# Patient Record
Sex: Male | Born: 1940
Health system: Southern US, Community
[De-identification: ages and names within clinical notes are randomized; demographics above are authoritative.]

## PROBLEM LIST (undated history)

## (undated) DIAGNOSIS — I4819 Other persistent atrial fibrillation: Secondary | ICD-10-CM

## (undated) DIAGNOSIS — H547 Unspecified visual loss: Secondary | ICD-10-CM

## (undated) DIAGNOSIS — Z8739 Personal history of other diseases of the musculoskeletal system and connective tissue: Secondary | ICD-10-CM

## (undated) DIAGNOSIS — I10 Essential (primary) hypertension: Secondary | ICD-10-CM

## (undated) DIAGNOSIS — G4733 Obstructive sleep apnea (adult) (pediatric): Secondary | ICD-10-CM

## (undated) DIAGNOSIS — J069 Acute upper respiratory infection, unspecified: Secondary | ICD-10-CM

## (undated) DIAGNOSIS — E669 Obesity, unspecified: Secondary | ICD-10-CM

## (undated) DIAGNOSIS — J449 Chronic obstructive pulmonary disease, unspecified: Secondary | ICD-10-CM

## (undated) DIAGNOSIS — K219 Gastro-esophageal reflux disease without esophagitis: Secondary | ICD-10-CM

## (undated) DIAGNOSIS — I517 Cardiomegaly: Secondary | ICD-10-CM

## (undated) DIAGNOSIS — J42 Unspecified chronic bronchitis: Secondary | ICD-10-CM

## (undated) DIAGNOSIS — M1990 Unspecified osteoarthritis, unspecified site: Secondary | ICD-10-CM

## (undated) DIAGNOSIS — R413 Other amnesia: Secondary | ICD-10-CM

## (undated) DIAGNOSIS — E7800 Pure hypercholesterolemia, unspecified: Secondary | ICD-10-CM

## (undated) DIAGNOSIS — N200 Calculus of kidney: Secondary | ICD-10-CM

## (undated) DIAGNOSIS — E119 Type 2 diabetes mellitus without complications: Secondary | ICD-10-CM

## (undated) HISTORY — DX: Essential (primary) hypertension: I10

## (undated) HISTORY — PX: TONSILLECTOMY: SUR1361

## (undated) HISTORY — DX: Calculus of kidney: N20.0

## (undated) HISTORY — DX: Obesity, unspecified: E66.9

## (undated) HISTORY — DX: Cardiomegaly: I51.7

## (undated) HISTORY — DX: Type 2 diabetes mellitus without complications: E11.9

## (undated) HISTORY — PX: URETEROLITHOTOMY: SHX71

## (undated) HISTORY — DX: Other persistent atrial fibrillation: I48.19

---
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
## 2004-10-18 HISTORY — PX: INCISE AND DRAIN ABCESS: PRO64

## 2005-05-21 ENCOUNTER — Ambulatory Visit (HOSPITAL_COMMUNITY): Admission: RE | Admit: 2005-05-21 | Discharge: 2005-05-21 | Payer: Self-pay | Admitting: Unknown Physician Specialty

## 2005-06-14 ENCOUNTER — Ambulatory Visit: Payer: Self-pay | Admitting: Physical Medicine & Rehabilitation

## 2005-06-14 ENCOUNTER — Encounter
Admission: RE | Admit: 2005-06-14 | Discharge: 2005-09-12 | Payer: Self-pay | Admitting: Physical Medicine & Rehabilitation

## 2005-06-18 ENCOUNTER — Ambulatory Visit: Payer: Self-pay | Admitting: Cardiology

## 2005-08-11 ENCOUNTER — Ambulatory Visit: Payer: Self-pay | Admitting: Physical Medicine & Rehabilitation

## 2005-10-02 ENCOUNTER — Encounter
Admission: RE | Admit: 2005-10-02 | Discharge: 2005-12-31 | Payer: Self-pay | Admitting: Physical Medicine & Rehabilitation

## 2005-10-02 ENCOUNTER — Ambulatory Visit: Payer: Self-pay | Admitting: Physical Medicine & Rehabilitation

## 2005-12-02 ENCOUNTER — Ambulatory Visit: Payer: Self-pay | Admitting: Physical Medicine & Rehabilitation

## 2008-01-16 ENCOUNTER — Ambulatory Visit: Payer: Self-pay | Admitting: Cardiology

## 2008-03-08 ENCOUNTER — Ambulatory Visit: Payer: Self-pay | Admitting: Cardiology

## 2008-03-12 ENCOUNTER — Ambulatory Visit: Payer: Self-pay | Admitting: Cardiology

## 2008-03-12 ENCOUNTER — Encounter: Payer: Self-pay | Admitting: Physician Assistant

## 2008-03-15 ENCOUNTER — Ambulatory Visit: Payer: Self-pay | Admitting: Cardiology

## 2008-03-18 HISTORY — PX: CARDIAC CATHETERIZATION: SHX172

## 2008-04-05 ENCOUNTER — Ambulatory Visit: Payer: Self-pay | Admitting: Cardiology

## 2008-04-10 ENCOUNTER — Ambulatory Visit: Payer: Self-pay | Admitting: Cardiology

## 2008-04-11 ENCOUNTER — Ambulatory Visit (HOSPITAL_COMMUNITY): Admission: RE | Admit: 2008-04-11 | Discharge: 2008-04-11 | Payer: Self-pay | Admitting: Cardiovascular Disease

## 2008-04-11 ENCOUNTER — Ambulatory Visit: Payer: Self-pay | Admitting: Cardiovascular Disease

## 2008-04-17 ENCOUNTER — Ambulatory Visit: Payer: Self-pay | Admitting: Cardiology

## 2008-04-25 ENCOUNTER — Ambulatory Visit: Payer: Self-pay | Admitting: Cardiology

## 2008-04-30 ENCOUNTER — Ambulatory Visit: Payer: Self-pay | Admitting: Cardiology

## 2008-05-01 ENCOUNTER — Ambulatory Visit: Payer: Self-pay | Admitting: Cardiology

## 2008-05-15 ENCOUNTER — Ambulatory Visit: Payer: Self-pay | Admitting: Cardiology

## 2008-06-12 ENCOUNTER — Ambulatory Visit: Payer: Self-pay | Admitting: Cardiology

## 2008-07-08 ENCOUNTER — Ambulatory Visit: Payer: Self-pay | Admitting: Cardiology

## 2008-07-25 ENCOUNTER — Encounter: Payer: Self-pay | Admitting: Cardiology

## 2008-07-25 ENCOUNTER — Ambulatory Visit: Payer: Self-pay | Admitting: Cardiology

## 2008-08-06 ENCOUNTER — Ambulatory Visit: Payer: Self-pay | Admitting: Cardiology

## 2008-09-03 ENCOUNTER — Ambulatory Visit: Payer: Self-pay | Admitting: Cardiology

## 2008-10-01 ENCOUNTER — Ambulatory Visit: Payer: Self-pay | Admitting: Cardiology

## 2008-10-18 HISTORY — PX: LUMBAR DISC SURGERY: SHX700

## 2008-10-28 ENCOUNTER — Encounter
Admission: RE | Admit: 2008-10-28 | Discharge: 2009-01-26 | Payer: Self-pay | Admitting: Physical Medicine & Rehabilitation

## 2008-10-29 ENCOUNTER — Ambulatory Visit: Payer: Self-pay | Admitting: Cardiology

## 2008-10-29 ENCOUNTER — Ambulatory Visit: Payer: Self-pay | Admitting: Physical Medicine & Rehabilitation

## 2008-10-31 ENCOUNTER — Ambulatory Visit: Payer: Self-pay | Admitting: Physical Medicine & Rehabilitation

## 2008-11-27 ENCOUNTER — Ambulatory Visit: Payer: Self-pay | Admitting: Cardiology

## 2008-11-29 ENCOUNTER — Ambulatory Visit: Payer: Self-pay | Admitting: Physical Medicine & Rehabilitation

## 2008-12-04 ENCOUNTER — Ambulatory Visit (HOSPITAL_COMMUNITY)
Admission: RE | Admit: 2008-12-04 | Discharge: 2008-12-04 | Payer: Self-pay | Admitting: Physical Medicine & Rehabilitation

## 2008-12-12 ENCOUNTER — Ambulatory Visit: Payer: Self-pay | Admitting: Physical Medicine & Rehabilitation

## 2008-12-27 ENCOUNTER — Ambulatory Visit: Payer: Self-pay | Admitting: Cardiology

## 2009-01-16 ENCOUNTER — Ambulatory Visit: Payer: Self-pay | Admitting: Physical Medicine & Rehabilitation

## 2009-01-29 ENCOUNTER — Ambulatory Visit: Payer: Self-pay | Admitting: Cardiology

## 2009-02-04 ENCOUNTER — Encounter: Payer: Self-pay | Admitting: Cardiology

## 2009-02-04 ENCOUNTER — Ambulatory Visit: Payer: Self-pay | Admitting: Cardiology

## 2009-02-11 ENCOUNTER — Encounter
Admission: RE | Admit: 2009-02-11 | Discharge: 2009-02-14 | Payer: Self-pay | Admitting: Physical Medicine & Rehabilitation

## 2009-02-13 ENCOUNTER — Ambulatory Visit: Payer: Self-pay | Admitting: Physical Medicine & Rehabilitation

## 2009-02-14 ENCOUNTER — Ambulatory Visit: Payer: Self-pay | Admitting: Physical Medicine & Rehabilitation

## 2009-02-25 ENCOUNTER — Ambulatory Visit: Payer: Self-pay | Admitting: Cardiology

## 2009-03-11 ENCOUNTER — Ambulatory Visit: Payer: Self-pay | Admitting: Cardiology

## 2009-03-25 ENCOUNTER — Ambulatory Visit: Payer: Self-pay | Admitting: Cardiology

## 2009-04-11 ENCOUNTER — Ambulatory Visit: Payer: Self-pay

## 2009-05-09 ENCOUNTER — Ambulatory Visit: Payer: Self-pay

## 2009-05-21 ENCOUNTER — Ambulatory Visit: Payer: Self-pay | Admitting: Cardiology

## 2009-06-02 ENCOUNTER — Encounter: Payer: Self-pay | Admitting: *Deleted

## 2009-06-02 ENCOUNTER — Inpatient Hospital Stay (HOSPITAL_COMMUNITY): Admission: RE | Admit: 2009-06-02 | Discharge: 2009-06-03 | Payer: Self-pay | Admitting: Neurosurgery

## 2009-06-10 ENCOUNTER — Ambulatory Visit: Payer: Self-pay | Admitting: Cardiology

## 2009-07-01 ENCOUNTER — Ambulatory Visit: Payer: Self-pay | Admitting: Cardiology

## 2009-07-15 ENCOUNTER — Ambulatory Visit: Payer: Self-pay | Admitting: Cardiology

## 2009-07-15 LAB — CONVERTED CEMR LAB: POC INR: 2.9

## 2009-07-30 DIAGNOSIS — I4891 Unspecified atrial fibrillation: Secondary | ICD-10-CM

## 2009-08-08 ENCOUNTER — Ambulatory Visit: Payer: Self-pay | Admitting: Cardiology

## 2009-08-08 LAB — CONVERTED CEMR LAB: POC INR: 2

## 2009-08-18 ENCOUNTER — Ambulatory Visit: Payer: Self-pay | Admitting: Cardiology

## 2009-09-02 ENCOUNTER — Ambulatory Visit: Payer: Self-pay | Admitting: Cardiology

## 2009-09-02 LAB — CONVERTED CEMR LAB: POC INR: 1.7

## 2009-09-15 ENCOUNTER — Encounter: Admission: RE | Admit: 2009-09-15 | Discharge: 2009-09-15 | Payer: Self-pay | Admitting: Neurosurgery

## 2009-09-30 ENCOUNTER — Ambulatory Visit: Payer: Self-pay | Admitting: Cardiology

## 2009-10-18 HISTORY — PX: REPLACEMENT DISC ANTERIOR LUMBAR SPINE: SUR1215

## 2009-11-04 ENCOUNTER — Ambulatory Visit: Payer: Self-pay | Admitting: Cardiology

## 2009-11-04 LAB — CONVERTED CEMR LAB: POC INR: 2

## 2009-12-02 ENCOUNTER — Ambulatory Visit: Payer: Self-pay | Admitting: Cardiology

## 2009-12-02 LAB — CONVERTED CEMR LAB: POC INR: 2.6

## 2009-12-30 ENCOUNTER — Ambulatory Visit: Payer: Self-pay | Admitting: Cardiology

## 2010-01-30 ENCOUNTER — Ambulatory Visit: Payer: Self-pay | Admitting: Cardiology

## 2010-02-24 ENCOUNTER — Ambulatory Visit: Payer: Self-pay | Admitting: Cardiology

## 2010-03-03 ENCOUNTER — Encounter: Payer: Self-pay | Admitting: Cardiology

## 2010-03-13 ENCOUNTER — Encounter: Payer: Self-pay | Admitting: Cardiology

## 2010-03-17 ENCOUNTER — Ambulatory Visit: Payer: Self-pay | Admitting: Cardiology

## 2010-03-26 ENCOUNTER — Telehealth (INDEPENDENT_AMBULATORY_CARE_PROVIDER_SITE_OTHER): Payer: Self-pay | Admitting: *Deleted

## 2010-04-01 ENCOUNTER — Encounter: Payer: Self-pay | Admitting: Cardiology

## 2010-04-01 ENCOUNTER — Inpatient Hospital Stay (HOSPITAL_COMMUNITY): Admission: RE | Admit: 2010-04-01 | Discharge: 2010-04-06 | Payer: Self-pay | Admitting: Neurosurgery

## 2010-04-14 ENCOUNTER — Ambulatory Visit: Payer: Self-pay | Admitting: Cardiology

## 2010-05-05 ENCOUNTER — Ambulatory Visit: Payer: Self-pay | Admitting: Cardiology

## 2010-05-05 LAB — CONVERTED CEMR LAB: POC INR: 2.5

## 2010-06-02 ENCOUNTER — Ambulatory Visit: Payer: Self-pay | Admitting: Cardiology

## 2010-06-02 LAB — CONVERTED CEMR LAB: POC INR: 3.3

## 2010-06-30 ENCOUNTER — Ambulatory Visit: Payer: Self-pay | Admitting: Cardiology

## 2010-06-30 LAB — CONVERTED CEMR LAB: POC INR: 2.4

## 2010-07-28 ENCOUNTER — Ambulatory Visit: Payer: Self-pay | Admitting: Cardiology

## 2010-08-20 ENCOUNTER — Ambulatory Visit: Payer: Self-pay | Admitting: Cardiology

## 2010-08-20 DIAGNOSIS — E782 Mixed hyperlipidemia: Secondary | ICD-10-CM

## 2010-08-21 ENCOUNTER — Encounter: Payer: Self-pay | Admitting: Cardiology

## 2010-09-25 ENCOUNTER — Ambulatory Visit: Payer: Self-pay | Admitting: Cardiology

## 2010-09-28 ENCOUNTER — Telehealth (INDEPENDENT_AMBULATORY_CARE_PROVIDER_SITE_OTHER): Payer: Self-pay | Admitting: *Deleted

## 2010-11-08 ENCOUNTER — Encounter: Payer: Self-pay | Admitting: Internal Medicine

## 2010-11-15 LAB — CONVERTED CEMR LAB
POC INR: 2.3
POC INR: 2.3

## 2010-11-19 NOTE — Medication Information (Signed)
Summary: ccr-lr  Anticoagulant Therapy  Managed by: Vashti Hey, RN PCP: Dr. Erasmo Downer Supervising MD: Andee Lineman MD, Michelle Piper Indication 1: Atrial Fibrillation (ICD-427.31) Lab [facility] [place] Site: [facility] Clinic INR POC 1.7  Dietary changes: no    Health status changes: no    Bleeding/hemorrhagic complications: no    Recent/future hospitalizations: no    Any changes in medication regimen? no    Recent/future dental: no  Any missed doses?: no       Is patient compliant with meds? yes      Comments: pt denies missing doses  Allergies: 1)  ! Pcn  Anticoagulation Management History:      The patient is taking warfarin and comes in today for a routine follow up visit.  Positive risk factors for bleeding include an age of 69 years or older.  The bleeding index is 'intermediate risk'.  Negative CHADS2 values include Age > 88 years old.  The start date was 10/19/2007.  Anticoagulation responsible provider: Andee Lineman MD, Michelle Piper.  INR POC: 1.7.  Cuvette Lot#: [id number].  Exp: 10/11.    Anticoagulation Management Assessment/Plan:      The patient's current anticoagulation dose is Warfarin sodium 2.5 mg tabs: Take 1 tablet by mouth as directed, Warfarin sodium 5 mg tabs: Take 1 tablet by mouth as directed.  The target INR is 2 - 3.  The next INR is due 02/24/2010.  Anticoagulation instructions were given to patient.  Results were reviewed/authorized by Vashti Hey, RN.  He was notified by Vashti Hey RN.         Prior Anticoagulation Instructions: INR 2.2 Continue coumadin 7.5mg  once daily except 10mg  on T,Th,Sat  Current Anticoagulation Instructions: INR 1.7 Take coumadin 15mg  tonight, 12.5mg  tomorrow night then resume 7.5mg  qd except 10mg  on Tuesdays, Thursdays and Saturdays

## 2010-11-19 NOTE — Letter (Signed)
Summary: Appointment -missed  [facility] at [facility] [address]   [place], [place] [address]   Phone: [phone]  Fax: [phone]     Mar 13, 2010 MRN: [id number]     [name] [name] [address] [place], [place]  [address]     Dear Mr. Christians,  Our records indicate you missed your appointment on Mar 13, 2010                        with COUMADIN.   It is very important that we reach you to reschedule this appointment. We look forward to participating in your health care needs.   Please contact us at the number listed above at your earliest convenience to reschedule this appointment.   Sincerely,    Glass blower/designer

## 2010-11-19 NOTE — Medication Information (Signed)
Summary: ccr-lr  Anticoagulant Therapy  Managed by: Vashti Hey, RN PCP: Dr. Erasmo Downer Supervising MD: Andee Lineman MD, Michelle Piper Indication 1: Atrial Fibrillation (ICD-427.31) Lab [facility] [place] Site: [facility] Care Clinic INR POC 2.0  Dietary changes: no    Health status changes: no    Bleeding/hemorrhagic complications: no    Recent/future hospitalizations: no    Any changes in medication regimen? no    Recent/future dental: no  Any missed doses?: no       Is patient compliant with meds? yes       Allergies: 1)  ! Pcn  Anticoagulation Management History:      The patient is taking warfarin and comes in today for a routine follow up visit.  Positive risk factors for bleeding include an age of 69 years or older.  The bleeding index is 'intermediate risk'.  Negative CHADS2 values include Age > 69 years old.  The start date was 10/19/2007.  Anticoagulation responsible provider: Andee Lineman MD, Michelle Piper.  INR POC: 2.0.  Cuvette Lot#: [id number].  Exp: 10/11.    Anticoagulation Management Assessment/Plan:      The patient's current anticoagulation dose is Warfarin sodium 2.5 mg tabs: Take 1 tablet by mouth as directed, Warfarin sodium 5 mg tabs: Take 1 tablet by mouth as directed.  The target INR is 2 - 3.  The next INR is due 12/02/2009.  Anticoagulation instructions were given to patient.  Results were reviewed/authorized by Vashti Hey, RN.  He was notified by Vashti Hey RN.         Prior Anticoagulation Instructions: INR 2.2 Continue coumadin 7.5mg  once daily except 10mg  on Tuesdays and Saturdays  Current Anticoagulation Instructions: INR 2.0 Take coumadin 12.5mg  tonight then increase dose to 7.5mg  once daily exxept 10mg  on T,Th,Sat

## 2010-11-19 NOTE — Medication Information (Signed)
Summary: ccr  Anticoagulant Therapy  Managed by: Vashti Hey, RN PCP: Dr. Erasmo Downer Supervising MD: Antoine Poche MD, Fayrene Fearing Indication 1: Atrial Fibrillation (ICD-427.31) Lab Used: [facility] [place] Site: [facility] Care Clinic INR POC 3.0  Dietary changes: no    Health status changes: yes       Details: scheduled for back surgery on 6/15  Pt was told to stop coumadin on 6/9  Bleeding/hemorrhagic complications: no    Recent/future hospitalizations: no    Any changes in medication regimen? no    Recent/future dental: no  Any missed doses?: no       Is patient compliant with meds? yes       Allergies: 1)  ! Pcn  Anticoagulation Management History:      Positive risk factors for bleeding include an age of 69 years or older.  The bleeding index is 'intermediate risk'.  Negative CHADS2 values include Age > 88 years old.  The start date was 10/19/2007.  Anticoagulation responsible provider: Antoine Poche MD, Fayrene Fearing.  INR POC: 3.0.  Exp: 10/11.    Anticoagulation Management Assessment/Plan:      The patient's current anticoagulation dose is Warfarin sodium 2.5 mg tabs: Take 1 tablet by mouth as directed, Warfarin sodium 5 mg tabs: Take 1 tablet by mouth as directed.  The target INR is 2 - 3.  The next INR is due 04/14/2010.  Anticoagulation instructions were given to patient.  Results were reviewed/authorized by Vashti Hey, RN.  He was notified by Vashti Hey RN.         Prior Anticoagulation Instructions: INR 1.8 increase coumadin to 10mg  once daily   Current Anticoagulation Instructions: INR 3.0 Continue coumadin 10mg  once daily until surgery.  Dr Terrilee Files told pt to stop coumadin on 03/26/10. Surgery schedueld for 6/15  Will be in hosp. appx 4 days. Will resume coumadin at above dose after returning home unless advised differently upon discharge.

## 2010-11-19 NOTE — Op Note (Signed)
Summary: Operative Report  Operative Report   Imported By: Dorise Hiss 08/20/2010 08:17:15  _____________________________________________________________________  External Attachment:    Type:   Image     Comment:   External Document

## 2010-11-19 NOTE — Medication Information (Signed)
Summary: CCR  Anticoagulant Therapy  Managed by: Vashti Hey, RN PCP: Dr. Erasmo Downer Supervising MD: Andee Lineman MD, Michelle Piper Indication 1: Atrial Fibrillation (ICD-427.31) Lab [facility]  Site: [facility] Clinic INR POC 1.8  Dietary changes: no    Health status changes: no    Bleeding/hemorrhagic complications: no    Recent/future hospitalizations: no    Any changes in medication regimen? no    Recent/future dental: no  Any missed doses?: no       Is patient compliant with meds? yes       Allergies: 1)  ! Pcn  Anticoagulation Management History:      The patient is taking warfarin and comes in today for a routine follow up visit.  Positive risk factors for bleeding include an age of 69 years or older.  The bleeding index is 'intermediate risk'.  Negative CHADS2 values include Age > 69 years old.  The start date was 10/19/2007.  Anticoagulation responsible provider: Andee Lineman MD, Michelle Piper.  INR POC: 1.8.  Cuvette Lot#: [id number].  Exp: 10/11.    Anticoagulation Management Assessment/Plan:      The patient's current anticoagulation dose is Warfarin sodium 2.5 mg tabs: Take 1 tablet by mouth as directed, Warfarin sodium 5 mg tabs: Take 1 tablet by mouth as directed.  The target INR is 2 - 3.  The next INR is due 03/13/2010.  Anticoagulation instructions were given to patient.  Results were reviewed/authorized by Vashti Hey, RN.  He was notified by Vashti Hey RN.         Prior Anticoagulation Instructions: INR 1.7 Take coumadin 15mg  tonight, 12.5mg  tomorrow night then resume 7.5mg  qd except 10mg  on Tuesdays, Thursdays and Saturdays  Current Anticoagulation Instructions: INR 1.8 increase coumadin to 10mg  once daily

## 2010-11-19 NOTE — Medication Information (Signed)
Summary: ccr-lr  Anticoagulant Therapy  Managed by: Vashti Hey, RN PCP: Dr. Erasmo Downer Supervising MD: Diona Browner MD, Remi Deter Indication 1: Atrial Fibrillation (ICD-427.31) Lab Used: [facility] [place] Site: [facility] Clinic INR POC 2.2  Dietary changes: no    Health status changes: no    Bleeding/hemorrhagic complications: no    Recent/future hospitalizations: yes       Details: Had lower back surgery on 04/01/10 @ [facility]     D/C 04/04/10  Any changes in medication regimen? no    Recent/future dental: no  Any missed doses?: yes     Details: Was off coumadin 5 days prior to back surgery  Is patient compliant with meds? yes       Allergies: 1)  ! Pcn  Anticoagulation Management History:      The patient is taking warfarin and comes in today for a routine follow up visit.  Positive risk factors for bleeding include an age of 69 years or older.  The bleeding index is 'intermediate risk'.  Negative CHADS2 values include Age > 2 years old.  The start date was 10/19/2007.  Anticoagulation responsible provider: Diona Browner MD, Remi Deter.  INR POC: 2.2.  Cuvette Lot#: [id number].  Exp: 10/11.    Anticoagulation Management Assessment/Plan:      The patient's current anticoagulation dose is Warfarin sodium 2.5 mg tabs: Take 1 tablet by mouth as directed, Warfarin sodium 5 mg tabs: Take 1 tablet by mouth as directed.  The target INR is 2 - 3.  The next INR is due 05/05/2010.  Anticoagulation instructions were given to patient.  Results were reviewed/authorized by Vashti Hey, RN.  He was notified by Vashti Hey RN.         Prior Anticoagulation Instructions: INR 3.0 Continue coumadin 10mg  once daily until surgery.  Dr Terrilee Files told pt to stop coumadin on 03/26/10. Surgery schedueld for 6/15  Will be in hosp. appx 4 days. Will resume coumadin at above dose after returning home unless advised differently upon discharge.  Current Anticoagulation Instructions: INR 2.2 Continue  coumadin 10mg  once daily

## 2010-11-19 NOTE — Medication Information (Signed)
Summary: ccr-pt coming at 1:30pm-lr  Anticoagulant Therapy  Managed by: Vashti Hey, RN PCP: Dr. Erasmo Downer Supervising MD: Diona Browner MD, Remi Deter Indication 1: Atrial Fibrillation (ICD-427.31) Lab Used: [facility] [place] Site: [facility] Clinic INR POC 2.4  Dietary changes: no    Health status changes: no    Bleeding/hemorrhagic complications: no    Recent/future hospitalizations: no    Any changes in medication regimen? no    Recent/future dental: no  Any missed doses?: no       Is patient compliant with meds? yes       Allergies: 1)  ! Pcn  Anticoagulation Management History:      The patient is taking warfarin and comes in today for a routine follow up visit.  Positive risk factors for bleeding include an age of 26 years or older.  The bleeding index is 'intermediate risk'.  Negative CHADS2 values include Age > 3 years old.  The start date was 10/19/2007.  Anticoagulation responsible Torian Thoennes: Diona Browner MD, Remi Deter.  INR POC: 2.4.  Cuvette Lot#: [id number].  Exp: 10/11.    Anticoagulation Management Assessment/Plan:      The patient's current anticoagulation dose is Warfarin sodium 2.5 mg tabs: Take 1 tablet by mouth as directed, Warfarin sodium 5 mg tabs: Take 2 tablet by mouth daily as directed.  The target INR is 2 - 3.  The next INR is due 07/28/2010.  Anticoagulation instructions were given to patient.  Results were reviewed/authorized by Vashti Hey, RN.  He was notified by Vashti Hey RN.         Prior Anticoagulation Instructions: INR 3.3 Take coumadin 5mg  tonight then resume 10mg  once daily   Current Anticoagulation Instructions: INR 2.4 Continue coumadin 10mg  once daily

## 2010-11-19 NOTE — Progress Notes (Signed)
Summary: [facility] wants Warfarin 5mg  RX  Phone Note From Pharmacy Call back at [phone] option 2   Summary of Call: [facility] pharmacy called requesting verbal ok for Warfarin 5mg  tablets. Prescription for 2.5mg  tablets was sent by Isabelle Course on 5/31. WIll have Misty Stanley confirm that this is acceptable. Initial call taken by: Cyril Loosen, RN, BSN,  March 26, 2010 11:42 AM  Follow-up for Phone Call        Coumadin 5mg  #90 called to [facility]. Follow-up by: Vashti Hey RN,  March 26, 2010 3:42 PM

## 2010-11-19 NOTE — Assessment & Plan Note (Signed)
Summary: 1 YR FU PER NOVEMBER  REMINDER/needs INR check -lr   Visit Type:  Follow-up Primary Provider:  Dr. Erasmo Downer   History of Present Illness: 70 year old male presents for followup. He was seen in November 2010 for followup. In the interim he underwent a redo lumbar laminectomy in June of this year with Dr. Lovell Sheehan.  He has had no anginal chest pain or breakthrough palpitations. Medicines are stable. He has done well with Coumadin, and his insurance covers his blood checks. We did talk about Pradaxa at his request, however do not plan to make any changes at this time. Lipids are followed with Dr. Linna Darner. Mr. Ciullo plans to start seeing Dr. Sherryll Burger when Dr. Linna Darner closes his office practice.  Clinical Review Panels:  Echocardiogram Echocardiogram  Conclusions:         1. The study quality is fair.          2. The study was technically limited due to the patient's inability to         lay in the left lateral decubitus position.          3. Mild concentric left ventricular hypertrophy is observed.         4. The estimated ejection fraction is 60-65%.          5. There is upper septal thickening.         6. The left atrium is moderately dilated.         7. The right ventricle is mildly dilated.          8. The right ventricular global systolic function is mildly reduced.         9. The right atrium is mildly dilated.          10. The aortic valve structure is normal.         11. The mitral valve leaflets appear normal.         12. There is mild mitral regurgitation.  (02/04/2009)    Preventive Screening-Counseling & Management  Alcohol-Tobacco     Smoking Status: never  Current Medications (verified): 1)  Flecainide Acetate 150 Mg Tabs (Flecainide Acetate) .... Take 1 Tablet By Mouth Twice A Day 2)  Lisinopril 10 Mg Tabs (Lisinopril) .... Take 1 Tablet By Mouth Once A Day 3)  Warfarin Sodium 2.5 Mg Tabs (Warfarin Sodium) .... Take 1 Tablet By Mouth As Directed 4)  Warfarin  Sodium 5 Mg Tabs (Warfarin Sodium) .... Take 2 Tablet By Mouth Daily As Directed 5)  Simvastatin 40 Mg Tabs (Simvastatin) .... Take 1 Tablet By Mouth Once A Day 6)  Fish Oil 1000 Mg Caps (Omega-3 Fatty Acids) .... Take 2 Tablet By Mouth Two Times A Day 7)  Prevacid 15 Mg Cpdr (Lansoprazole) .... Take 1 Tablet By Mouth Two Times A Day 8)  Alprazolam 0.25 Mg Tabs (Alprazolam) .... Take 1 Tablet By Mouth Once A Day As Needed 9)  Doxepin Hcl 25 Mg Caps (Doxepin Hcl) .... Take 1 Tablet By Mouth Two Times A Day As Needed 10)  Diphenhydramine Hcl 25 Mg Caps (Diphenhydramine Hcl) .... Take 1 Tablet By Mouth Once A Day At Bedtime 11)  Oxycodone-Acetaminophen 10-325 Mg Tabs (Oxycodone-Acetaminophen) .... Take One By Mouth Every 6 Hours As Needed Pain 12)  Ventolin Hfa 108 (90 Base) Mcg/act Aers (Albuterol Sulfate) .... As Needed 13)  Metformin Hcl 500 Mg Tabs (Metformin Hcl) .Marland Kitchen.. 1 Tablet Two Times A Day 14)  Glipizide Xl 5 Mg Xr24h-Tab (  Glipizide) .... Take 1 Tablet By Mouth Once A Day 15)  Colace 100 Mg Caps (Docusate Sodium) .... As Needed  Allergies (verified): 1)  ! Pcn  Comments:  Nurse/Medical Assistant: The patient's medication list and allergies were reviewed with the patient and were updated in the Medication and Allergy Lists.  Past History:  Past Medical History: Last updated: 08/18/2009 Arthritis Atrial Fibrillation Diabetes Type 2 G E R D Hypertension Nephrolithiasis Benign prostatic hypertrophy  Social History: Last updated: 07/30/2009 Full Time Married  Tobacco Use - Former.  Alcohol Use - no Regular Exercise - no Drug Use - no  Review of Systems  The patient denies anorexia, fever, weight loss, chest pain, syncope, dyspnea on exertion, peripheral edema, melena, and hematochezia.         Still has residual back pain and some leg pain on the right. Has follow up with Dr. Lovell Sheehan in a few weeks. Otherwise reviewed and negative.  Vital Signs:  Patient profile:    70 year old male Height:      77 inches Weight:      277 pounds BMI:     32.97 Pulse rate:   68 / minute BP sitting:   125 / 80  (left arm) Cuff size:   large  Vitals Entered By: Carlye Grippe (August 20, 2010 9:20 AM)  Nutrition Counseling: Patient's BMI is greater than 25 and therefore counseled on weight management options.  Physical Exam  Additional Exam:  Obese male in no acute distress. HEENT: Conjunctiva and lids normal, all frank clear. Neck: Supple, no carotid bruits, no thyromegaly. Lungs: Clear to auscultation, nonlabored. Cardiac: Indistinct PMI, regular rate and rhythm, no S3 gallop. Extremities: No pitting edema. Distal pulses 2+.   EKG  Procedure date:  08/20/2010  Findings:      Sinus rhythm at 68 beats per minute with leftward axis, QRS 126 ms. QTC 446 ms.  Impression & Recommendations:  Problem # 1:  ATRIAL FIBRILLATION (ICD-427.31)  Symptomatically stable, in normal sinus rhythm today. Plan to continue flecainide and Coumadin. Annual followup will be maintained, sooner if needed.  His updated medication list for this problem includes:    Flecainide Acetate 150 Mg Tabs (Flecainide acetate) .Marland Kitchen... Take 1 tablet by mouth twice a day    Warfarin Sodium 2.5 Mg Tabs (Warfarin sodium) .Marland Kitchen... Take 1 tablet by mouth as directed    Warfarin Sodium 5 Mg Tabs (Warfarin sodium) .Marland Kitchen... Take 2 tablet by mouth daily as directed  Orders: EKG w/ Interpretation (93000) Protime INR (04540)  Problem # 2:  MIXED HYPERLIPIDEMIA (ICD-272.2)  Followed by Dr. Linna Darner.  The following medications were removed from the medication list:    Trilipix 135 Mg Cpdr (Choline fenofibrate) .Marland Kitchen... Take 1 tablet by mouth once a day His updated medication list for this problem includes:    Simvastatin 40 Mg Tabs (Simvastatin) .Marland Kitchen... Take 1 tablet by mouth once a day  Patient Instructions: 1)  Your physician wants you to follow-up in: 1 year. You will receive a reminder letter in the mail  one-two months in advance. If you don't receive a letter, please call our office to schedule the follow-up appointment. 2)  Your physician recommends that you continue on your current medications as directed. Please refer to the Current Medication list given to you today.  Anticoagulant Therapy  Managed by: Vashti Hey, RN PCP: Dr. Erasmo Downer Supervising MD: Diona Browner MD, Remi Deter Indication 1: Atrial Fibrillation (ICD-427.31) Lab Used: [facility]  [place] Site: [facility] of Care Clinic INR POC 2.3  Vital Signs: Weight: 277 lbs.  Pulse Rate: 68  Blood Pressure:  125 / 80   Dietary changes: no    Health status changes: no    Bleeding/hemorrhagic complications: no    Recent/future hospitalizations: no    Any changes in medication regimen? no    Recent/future dental: no  Any missed doses?: no       Is patient compliant with meds? yes

## 2010-11-19 NOTE — Medication Information (Signed)
Summary: ccr-lr  Anticoagulant Therapy  Managed by: Vashti Hey, RN PCP: Dr. Erasmo Downer Supervising MD: Andee Lineman MD, Michelle Piper Indication 1: Atrial Fibrillation (ICD-427.31) Lab [facility]: [facility] [place] Site: [facility] Care Clinic INR POC 2.5  Dietary changes: no    Health status changes: no    Bleeding/hemorrhagic complications: no    Recent/future hospitalizations: no    Any changes in medication regimen? no    Recent/future dental: no  Any missed doses?: no       Is patient compliant with meds? yes       Allergies: 1)  ! Pcn  Anticoagulation Management History:      The patient is taking warfarin and comes in today for a routine follow up visit.  Positive risk factors for bleeding include an age of 69 years or older.  The bleeding index is 'intermediate risk'.  Negative CHADS2 values include Age > 13 years old.  The start date was 10/19/2007.  Anticoagulation responsible provider: Andee Lineman MD, Michelle Piper.  INR POC: 2.5.  Cuvette Lot#: [id number].  Exp: 10/11.    Anticoagulation Management Assessment/Plan:      The patient's current anticoagulation dose is Warfarin sodium 2.5 mg tabs: Take 1 tablet by mouth as directed, Warfarin sodium 5 mg tabs: Take 1 tablet by mouth as directed.  The target INR is 2 - 3.  The next INR is due 06/02/2010.  Anticoagulation instructions were given to patient.  Results were reviewed/authorized by Vashti Hey, RN.  He was notified by Vashti Hey RN.         Prior Anticoagulation Instructions: INR 2.2 Continue coumadin 10mg  once daily   Current Anticoagulation Instructions: INR 2.5 Continue coumadin 10mg  once daily

## 2010-11-19 NOTE — Assessment & Plan Note (Signed)
Summary: coumadin check   Anticoagulant Therapy  Managed by: Eustace Pen RN PCP: Dr. Erasmo Downer Supervising MD: Diona Browner MD, Remi Deter Indication 1: Atrial Fibrillation (ICD-427.31) Lab [facility] [place] Site: [facility] Care Clinic INR POC 2.3  Dietary changes: no    Health status changes: no    Bleeding/hemorrhagic complications: no    Recent/future hospitalizations: no    Any changes in medication regimen? no    Recent/future dental: no  Any missed doses?: no       Is patient compliant with meds? yes         Anticoagulation Management History:      The patient is taking warfarin and comes in today for a routine follow up visit.  Positive risk factors for bleeding include an age of 67 years or older.  The bleeding index is 'intermediate risk'.  Negative CHADS2 values include Age > 19 years old.  The start date was 10/19/2007.  Anticoagulation responsible provider: Diona Browner MD, Remi Deter.  INR POC: 2.3.  Cuvette Lot#: [id number].  Exp: 10/11.    Anticoagulation Management Assessment/Plan:      The patient's current anticoagulation dose is Warfarin sodium 2.5 mg tabs: Take 1 tablet by mouth as directed, Warfarin sodium 5 mg tabs: Take 2 tablet by mouth daily as directed.  The target INR is 2 - 3.  The next INR is due 09/18/2010.  Anticoagulation instructions were given to patient.  Results were reviewed/authorized by Eustace Pen RN.  He was notified by Eustace Pen RN.         Prior Anticoagulation Instructions: INR 2.7  Continue coumadin 10mg  once daily   Current Anticoagulation Instructions: INR 2.3 Continue coumadin 10mg  once daily

## 2010-11-19 NOTE — Medication Information (Signed)
Summary: ccr-lr  Anticoagulant Therapy  Managed by: Vashti Hey, RN PCP: Dr. Erasmo Downer Supervising MD: Diona Browner MD, Remi Deter Indication 1: Atrial Fibrillation (ICD-427.31) Lab Used: [facility] [place] Site: [facility] INR POC 2.7  Dietary changes: no    Health status changes: no    Bleeding/hemorrhagic complications: no    Recent/future hospitalizations: no    Any changes in medication regimen? no    Recent/future dental: no  Any missed doses?: no       Is patient compliant with meds? yes       Allergies: 1)  ! Pcn  Anticoagulation Management History:      The patient is taking warfarin and comes in today for a routine follow up visit.  Positive risk factors for bleeding include an age of 69 years or older.  The bleeding index is 'intermediate risk'.  Negative CHADS2 values include Age > 69 years old.  The start date was 10/19/2007.  Anticoagulation responsible provider: Diona Browner MD, Remi Deter.  INR POC: 2.7.  Cuvette Lot#: [id number].  Exp: 10/11.    Anticoagulation Management Assessment/Plan:      The patient's current anticoagulation dose is Warfarin sodium 2.5 mg tabs: Take 1 tablet by mouth as directed, Warfarin sodium 5 mg tabs: Take 2 tablet by mouth daily as directed.  The target INR is 2 - 3.  The next INR is due 08/20/2010.  Anticoagulation instructions were given to patient.  Results were reviewed/authorized by Vashti Hey, RN.  He was notified by Vashti Hey RN.         Prior Anticoagulation Instructions: INR 2.4 Continue coumadin 10mg  once daily   Current Anticoagulation Instructions: INR 2.7  Continue coumadin 10mg  once daily

## 2010-11-19 NOTE — Letter (Signed)
Summary: Letter/ FAXED [facility]  Letter/ FAXED [facility]   Imported By: Dorise Hiss 03/09/2010 14:41:00  _____________________________________________________________________  External Attachment:    Type:   Image     Comment:   External Document

## 2010-11-19 NOTE — Medication Information (Signed)
Summary: ccr-lr  Anticoagulant Therapy  Managed by: Vashti Hey, RN PCP: Dr. Erasmo Downer Supervising MD: Antoine Poche MD, Fayrene Fearing Indication 1: Atrial Fibrillation (ICD-427.31) Lab Used: [facility] [place] Site: [facility] INR POC 2.6  Dietary changes: no    Health status changes: no    Bleeding/hemorrhagic complications: no    Recent/future hospitalizations: no    Any changes in medication regimen? yes       Details: started on Lyrica CV 50mg  bid  Recent/future dental: no  Any missed doses?: no       Is patient compliant with meds? yes       Allergies: 1)  ! Pcn  Anticoagulation Management History:      The patient is taking warfarin and comes in today for a routine follow up visit.  Positive risk factors for bleeding include an age of 69 years or older.  The bleeding index is 'intermediate risk'.  Negative CHADS2 values include Age > 84 years old.  The start date was 10/19/2007.  Anticoagulation responsible provider: Antoine Poche MD, Fayrene Fearing.  INR POC: 2.6.  Cuvette Lot#: [id number].  Exp: 10/11.    Anticoagulation Management Assessment/Plan:      The patient's current anticoagulation dose is Warfarin sodium 2.5 mg tabs: Take 1 tablet by mouth as directed, Warfarin sodium 5 mg tabs: Take 1 tablet by mouth as directed.  The target INR is 2 - 3.  The next INR is due 12/30/2009.  Anticoagulation instructions were given to patient.  Results were reviewed/authorized by Vashti Hey, RN.  He was notified by Vashti Hey RN.         Prior Anticoagulation Instructions: INR 2.0 Take coumadin 12.5mg  tonight then increase dose to 7.5mg  once daily exxept 10mg  on T,Th,Sat  Current Anticoagulation Instructions: INR 2.6 Continue coumadin 7.5mg  once daily except 10mg  T,Th,Sat

## 2010-11-19 NOTE — Medication Information (Signed)
Summary: ccr-lr  Anticoagulant Therapy  Managed by: Vashti Hey, RN PCP: Dr. Erasmo Downer Supervising MD: Antoine Poche MD, Fayrene Fearing Indication 1: Atrial Fibrillation (ICD-427.31) Lab Used: [facility] [place] Site: [facility] INR POC 3.3  Dietary changes: no    Health status changes: no    Bleeding/hemorrhagic complications: no    Recent/future hospitalizations: no    Any changes in medication regimen? no    Recent/future dental: no  Any missed doses?: no       Is patient compliant with meds? yes       Allergies: 1)  ! Pcn  Anticoagulation Management History:      The patient is taking warfarin and comes in today for a routine follow up visit.  Positive risk factors for bleeding include an age of 31 years or older.  The bleeding index is 'intermediate risk'.  Negative CHADS2 values include Age > 80 years old.  The start date was 10/19/2007.  Anticoagulation responsible provider: Antoine Poche MD, Fayrene Fearing.  INR POC: 3.3.  Cuvette Lot#: [id number].  Exp: 10/11.    Anticoagulation Management Assessment/Plan:      The patient's current anticoagulation dose is Warfarin sodium 2.5 mg tabs: Take 1 tablet by mouth as directed, Warfarin sodium 5 mg tabs: Take 2 tablet by mouth daily as directed.  The target INR is 2 - 3.  The next INR is due 06/30/2010.  Anticoagulation instructions were given to patient.  Results were reviewed/authorized by Vashti Hey, RN.  He was notified by Vashti Hey RN.         Prior Anticoagulation Instructions: INR 2.5 Continue coumadin 10mg  once daily   Current Anticoagulation Instructions: INR 3.3 Take coumadin 5mg  tonight then resume 10mg  once daily

## 2010-11-19 NOTE — Progress Notes (Signed)
Summary: Prescription plan change  Phone Note Call from Patient Call back at Home Phone [phone]   Summary of Call: Pt left message on voicemail asking for a return call. He states he has changed from [facility] to [facility] and wanted to make sure we had this information in our system. Left message to notify pt we will make this change when refills or med changes are made. Notified pt to make sure any refill requests come from [facility] not [facility].  Initial call taken by: Cyril Loosen, RN, BSN,  September 28, 2010 2:57 PM

## 2010-11-19 NOTE — Medication Information (Signed)
Summary: ccr-lr  Anticoagulant Therapy  Managed by: Vashti Hey, RN PCP: Dr. Erasmo Downer Supervising MD: Andee Lineman MD, Michelle Piper Indication 1: Atrial Fibrillation (ICD-427.31) Lab Used: [facility] [place] Site: [facility] INR POC 2.2  Dietary changes: no    Health status changes: no    Bleeding/hemorrhagic complications: no    Recent/future hospitalizations: no    Any changes in medication regimen? no    Recent/future dental: no  Any missed doses?: no       Is patient compliant with meds? yes       Allergies: 1)  ! Pcn  Anticoagulation Management History:      The patient is taking warfarin and comes in today for a routine follow up visit.  Positive risk factors for bleeding include an age of 73 years or older.  The bleeding index is 'intermediate risk'.  Negative CHADS2 values include Age > 88 years old.  The start date was 10/19/2007.  Anticoagulation responsible provider: Andee Lineman MD, Michelle Piper.  INR POC: 2.2.  Cuvette Lot#: [id number].  Exp: 10/11.    Anticoagulation Management Assessment/Plan:      The patient's current anticoagulation dose is Warfarin sodium 2.5 mg tabs: Take 1 tablet by mouth as directed, Warfarin sodium 5 mg tabs: Take 1 tablet by mouth as directed.  The target INR is 2 - 3.  The next INR is due 01/30/2010.  Anticoagulation instructions were given to patient.  Results were reviewed/authorized by Vashti Hey, RN.  He was notified by Vashti Hey RN.         Prior Anticoagulation Instructions: INR 2.6 Continue coumadin 7.5mg  once daily except 10mg  T,Th,Sat  Current Anticoagulation Instructions: INR 2.2 Continue coumadin 7.5mg  once daily except 10mg  on T,Th,Sat

## 2011-01-03 LAB — POCT I-STAT GLUCOSE
Glucose, Bld: 110 mg/dL — ABNORMAL HIGH (ref 70–99)
Glucose, Bld: 126 mg/dL — ABNORMAL HIGH (ref 70–99)
Operator id: 177011

## 2011-01-03 LAB — GLUCOSE, CAPILLARY
Glucose-Capillary: 104 mg/dL — ABNORMAL HIGH (ref 70–99)
Glucose-Capillary: 108 mg/dL — ABNORMAL HIGH (ref 70–99)
Glucose-Capillary: 118 mg/dL — ABNORMAL HIGH (ref 70–99)
Glucose-Capillary: 129 mg/dL — ABNORMAL HIGH (ref 70–99)
Glucose-Capillary: 129 mg/dL — ABNORMAL HIGH (ref 70–99)
Glucose-Capillary: 129 mg/dL — ABNORMAL HIGH (ref 70–99)
Glucose-Capillary: 144 mg/dL — ABNORMAL HIGH (ref 70–99)
Glucose-Capillary: 144 mg/dL — ABNORMAL HIGH (ref 70–99)
Glucose-Capillary: 148 mg/dL — ABNORMAL HIGH (ref 70–99)
Glucose-Capillary: 162 mg/dL — ABNORMAL HIGH (ref 70–99)
Glucose-Capillary: 168 mg/dL — ABNORMAL HIGH (ref 70–99)
Glucose-Capillary: 174 mg/dL — ABNORMAL HIGH (ref 70–99)
Glucose-Capillary: 92 mg/dL (ref 70–99)

## 2011-01-03 LAB — PROTIME-INR: INR: 1.06 (ref 0.00–1.49)

## 2011-01-03 LAB — APTT: aPTT: 26 seconds (ref 24–37)

## 2011-01-03 LAB — BASIC METABOLIC PANEL
BUN: 18 mg/dL (ref 6–23)
Chloride: 104 mEq/L (ref 96–112)
Potassium: 4.5 mEq/L (ref 3.5–5.1)

## 2011-01-03 LAB — CBC
HCT: 35.4 % — ABNORMAL LOW (ref 39.0–52.0)
MCV: 91.6 fL (ref 78.0–100.0)
Platelets: 117 10*3/uL — ABNORMAL LOW (ref 150–400)
WBC: 10.6 10*3/uL — ABNORMAL HIGH (ref 4.0–10.5)

## 2011-01-04 LAB — CBC
HCT: 44.1 % (ref 39.0–52.0)
Hemoglobin: 14.9 g/dL (ref 13.0–17.0)
Platelets: 153 10*3/uL (ref 150–400)
WBC: 11.1 10*3/uL — ABNORMAL HIGH (ref 4.0–10.5)

## 2011-01-04 LAB — BASIC METABOLIC PANEL
Calcium: 9.4 mg/dL (ref 8.4–10.5)
GFR calc non Af Amer: >60 mL/min (ref >60)
Potassium: 4.5 mEq/L (ref 3.5–5.1)
Sodium: 138 mEq/L (ref 135–145)

## 2011-01-04 LAB — SURGICAL PCR SCREEN: Staphylococcus aureus: NEGATIVE

## 2011-01-04 LAB — PROTIME-INR: INR: 2.89 — ABNORMAL HIGH (ref 0.00–1.49)

## 2011-01-04 LAB — TYPE AND SCREEN: Antibody Screen: NEGATIVE

## 2011-01-04 LAB — APTT: aPTT: 41 seconds — ABNORMAL HIGH (ref 24–37)

## 2011-01-23 LAB — CBC
HCT: 44.3 % (ref 39.0–52.0)
Hemoglobin: 15 g/dL (ref 13.0–17.0)
MCV: 89.8 fL (ref 78.0–100.0)
RBC: 4.94 MIL/uL (ref 4.22–5.81)
WBC: 8.7 10*3/uL (ref 4.0–10.5)

## 2011-01-23 LAB — BASIC METABOLIC PANEL
BUN: 19 mg/dL (ref 6–23)
Chloride: 102 mEq/L (ref 96–112)
GFR calc Af Amer: >60 mL/min (ref >60)
GFR calc non Af Amer: >60 mL/min (ref >60)
Potassium: 4.7 mEq/L (ref 3.5–5.1)
Sodium: 137 mEq/L (ref 135–145)

## 2011-01-23 LAB — APTT
aPTT: 23 seconds — ABNORMAL LOW (ref 24–37)
aPTT: 32 seconds (ref 24–37)

## 2011-01-23 LAB — PROTIME-INR: Prothrombin Time: 14.2 seconds (ref 11.6–15.2)

## 2011-01-23 LAB — GLUCOSE, CAPILLARY
Glucose-Capillary: 165 mg/dL — ABNORMAL HIGH (ref 70–99)
Glucose-Capillary: 186 mg/dL — ABNORMAL HIGH (ref 70–99)
Glucose-Capillary: 186 mg/dL — ABNORMAL HIGH (ref 70–99)

## 2011-03-02 NOTE — Assessment & Plan Note (Signed)
The patient returns today.  I last saw him December 03, 2005.  He was  lost the followup, because he had a very good and prolonged result with  the right S1 transforaminal lumbar epidural steroid injection performed  October 04, 2005.  He was maintained on Lyrica 75 mg p.o. b.i.d.   Since that time, he has been starting on Coumadin for paroxysmal atrial  fibrillation.  He is off of it currently because of some sinus surgery  he had last week and plans to resume on January 18.   SOCIAL HISTORY:  He was laid off as VP of sales.  He is getting  unemployment insurance.   REVIEW OF SYSTEMS:  Positive for shortness of breath with exertion and  trouble walking.   He reportedly had a normal cardiac catheterization performed within the  last couple of months.  He had a false positive adenosine Cardiolite.   His blood pressure is 145/67, pulse 75, respirations 18, O2 sat 97% room  air.  In general, in no acute distress.  Orientation x3.  Affect alert.  Gait is normal.  His sensory exam is normal in bilateral extremity.  Although, he does report some paresthesias in his feet.  He does have  intrinsic atrophy bilateral feet.  He has no problems with  proprioception.  He has no evidence of edema in lower extremities but  does have a pretibial rash on the left, which is maculopapular.  His  knees showed no evidence of effusion.  He has good hip, knee, and ankle  range of motion.  Lower extremity strength is normal.  Back has no  tenderness to palpation.  Lumbar spine range of motion is 75% forward  flexion and extension.   IMPRESSION:  Right S1 radiculopathy due to disk herniation and facet  arthropathy.  He has right L5-S1 foraminal stenosis as well.  He did  quite well after an S1 transforaminal injection.  We will go ahead and  reschedule while he is off Coumadin.   In terms of medications, he receives medicines from his primary doctor.  Certainly, he has not been an active patient, so I do  not think this  accounts for discharge at this point.  We will repeat the urine drug  screen and in event he becomes more active patient requiring narcotic  analgesic on a regular basis.  We will need to see him on a regular  monitoring program.  Otherwise, we will see how he does with the  injection once again and see if this is enough to avoid any other type  of pain medications.      Erick Colace, M.D.  Electronically Signed     AEK/MedQ  D:  10/29/2008 16:11:15  T:  10/30/2008 06:59:03  Job #:  [id number]

## 2011-03-02 NOTE — Procedures (Signed)
[name], [name] [name]                ACCOUNT NO.:  [id number]   MEDICAL RECORD NO.:  [id number]          PATIENT TYPE:  OUT   LOCATION:  RAD                           FACILITY:  [facility]   PHYSICIAN:  Erick Colace, M.D.DATE OF BIRTH:  03-Aug-1941   DATE OF PROCEDURE:  12/12/2008  DATE OF DISCHARGE:  12/04/2008                               OPERATIVE REPORT   This is a right L3-L4 and right L4-L5 transforaminal lumbar epidural  steroid injection under fluoroscopic guidance.  He had a repeat MRI  demonstrating some progression at the L3-L4 stenotic segment mainly  lateral recess stenosis at that level.  On the right side, he had  foraminal stenosis L4-L5 and L5-S1.  I discussed these findings with  him.   Pain does interfere with self-care mobility, persists despite narcotic  analgesic medications.   The patient is off Coumadin.  His last INR is 1.1 done yesterday and no  Coumadin since that time.   Informed consent was obtained after describing risks and benefits of the  procedure with the patient.  These include bleeding, bruising, and  infection.  He elects to proceed and has given written consent.  The  patient placed prone on fluoroscopy table.  Betadine prep, sterile  drape.  A 25-gauge 1-1/2-inch needle was used to anesthetize the skin  and subcutaneous tissue 1% lidocaine x2 mL and a 22-gauge 3-1/2-inch  spinal needle was inserted under fluoroscopic guidance targeting  initially the right L4-L5 intervertebral foramen.  However, he had  excessive of foraminal stenosis that could not access.  Therefore,  following up to L4-L5 level and while this was done with difficulty,  nerve root outline was obtained after manipulating needle under AP,  lateral, and oblique views.  Omnipaque 180 under live fluoro  demonstrated no intravascular uptake with an epidural spread followed by  injection of solution containing 1 mL of 10 mg/mL of dexamethasone and  1.5 mL of 1% lidocaine.  This  same procedure was repeated at the L3-L4  level.  At the L3-L4 level, there were no difficulties, less  spondylosis, less foraminal stenosis.  Pre-injection pain level 9/10,  post injection 0/10.      Erick Colace, M.D.  Electronically Signed     AEK/MEDQ  D:  12/12/2008 15:39:22  T:  12/13/2008 04:23:39  Job:  [id number]

## 2011-03-02 NOTE — Procedures (Signed)
[name] [name], [name] [name]                ACCOUNT NO.:  [id number]   MEDICAL RECORD NO.:  [id number]          PATIENT TYPE:  REC   LOCATION:  TPC                          FACILITY:  [facility]   PHYSICIAN:  Erick Colace, M.D.DATE OF BIRTH:  1941-01-24   DATE OF PROCEDURE:  02/14/2009  DATE OF DISCHARGE:                               OPERATIVE REPORT   PROCEDURE:  Right paramedian L5-S1 translaminar lumbar epidural steroid  injection under fluoroscopic guidance.   INDICATIONS:  Lumbar radiculitis with pain affecting the right lower  extremity, has had good response in the past with epidural steroid  injections.  Pain is only partial responsive to medication management  and other conservative care and interferes with self-care mobility.   Informed consent was obtained after describing the risks and benefits of  the procedure with the patient.  These include bleeding, bruising,  infection, and elects to proceed and has given written consent.   The patient placed prone on fluoroscopy table.  Betadine prep, sterile  drape, a 25-gauge 1-1/2-inch needle was used to anesthetize the skin and  subcutaneous tissue 1% lidocaine x2 mL.  Then, a 22-gauge 3-1/2-inch  spinal needle was inserted.  Then, an 18-gauge 8 cm Houston needle was  inserted under fluoroscopic guidance into the L5-S1 interlaminar space  right paramedian approach.  AP, lateral, and oblique imaging utilized.  Omnipaque 180 under live fluoro demonstrated good epidural spread x2 mL  followed by injection of 2 mL of 40 mg/mL of Depo-Medrol, 2 mL of 1% MPF  lidocaine.  The patient tolerated procedure well.  A loss-of-resistance  technique was utilized after the needle tip approximated the posterior  elements and needle was then advanced under lateral guidance.   The patient tolerated procedure well.  Post-injection instructions  given.      Erick Colace, M.D.  Electronically Signed     AEK/MEDQ  D:  02/14/2009 13:31:51   T:  02/15/2009 03:35:51  Job:  [id number]

## 2011-03-02 NOTE — Op Note (Signed)
[name] [name], [name] [name]                ACCOUNT NO.:  [id number]   MEDICAL RECORD NO.:  [id number]          PATIENT TYPE:  INP   LOCATION:  3536                         FACILITY:  [facility]   PHYSICIAN:  Cristi Loron, M.D.DATE OF BIRTH:  August 27, 1941   DATE OF PROCEDURE:  06/02/2009  DATE OF DISCHARGE:                               OPERATIVE REPORT   BRIEF HISTORY:  The patient is a 70 year old white male who has suffered  from back and right leg pain consistent with a lumbar radiculopathy.  He  failed medical management and worked up with a lumbar MRI which  demonstrated the patient had tissue degeneration as well as spinal  stenosis at L4-5 and L5-S1 with spondylolisthesis at L4-5.  I discussed  the various treatment options with the patient including surgery.  He  has weighed the risks, benefits, and alternatives of surgery and decided  to proceed with a L4-5 and L5-S1 laminotomy and foraminotomy on the  right.   PREOPERATIVE DIAGNOSES:  L4-5 spondylolisthesis, L5-S1 spinal stenosis,  lumbago, lumbar radiculopathy.   POSTOPERATIVE DIAGNOSES:  L4-5 spondylolisthesis, L5-S1 spinal stenosis,  lumbago, lumbar radiculopathy.   PROCEDURE:  Right L4 and L5 laminotomy and foraminotomy to decompress  the right L4, L5, and S1 nerve roots using microdissection.   SURGEON:  Cristi Loron, MD   ASSISTANT:  Hewitt Shorts, MD   ANESTHESIA:  Endotracheal.   ESTIMATED BLOOD LOSS:  50 mL.   SPECIMENS:  None.   DRAINS:  None.   COMPLICATIONS:  None.   DESCRIPTION OF PROCEDURE:  The patient was brought to the operating room  by the anesthesia team.  General endotracheal anesthesia was induced.  The patient was then turned to the prone position on the Wilson frame.  His lumbosacral region was then prepared with Betadine scrub and  Betadine solution.  Sterile drapes were applied.  I then injected the  area to be incised with Marcaine with epinephrine solution.  Used a  scalpel to  make a linear midline incision over the L4-5 and 5-1  interspace.  I used electrocautery to perform a right-sided  subperiosteal dissection exposing the right spinous process and lamina  of L4 and L5 in the upper sacrum.  We obtained intraoperative radiograph  to confirm our location and then inserted the [facility] retractor for  exposure.  We then used a high-speed drill to perform bilateral L4 and  L5 laminotomies.  We then brought the operative microscope into the  field, and under its magnification and illumination we completed the  microdissection/decompression.  We used a Kerrison punch to widen the  laminotomies at L4 and L5.  We removed the ligament flavum at L4-5 and  L5-S1.  We then performed foraminotomies about the right L4, L5, and S1  nerve roots using microdissection.  We then inspected the intervertebral  disk at L4-5 and 5-1.  There were no significant herniations.  We then  palpated along the exit route of the right L4, L5, S1 nerve roots and  noted they are well decompressed.  We then obtained hemostasis using  bipolar electrocautery.  We irrigated the wound out with bacitracin  solution.  We then removed the retractors and reapproximated the  patient's thoracolumbar fascia with interrupted #1 Vicryl suture, the  subcutaneous tissue with interrupted 2-0 Vicryl suture, and the skin  with Steri-Strips and Benzoin.  The wound was then coated with  bacitracin ointment and sterile dressing was applied.  The drapes were  removed.  The patient was subsequently returned to supine position where  he was extubated by the anesthesia team and transported to the  postanesthesia care unit in stable condition.  All sponge, instrument,  and needle counts were correct at the end of this case.      Cristi Loron, M.D.  Electronically Signed     JDJ/MEDQ  D:  06/02/2009  T:  06/03/2009  Job:  [id number]

## 2011-03-02 NOTE — Assessment & Plan Note (Signed)
[facility]                          [facility] OFFICE NOTE   NAME:[name] [name], [name] [name]                       MRN:          [id number]  DATE:01/29/2009                            DOB:          1941-07-18    PRIMARY CARE PHYSICIAN:  Erasmo Downer, MD   REASON FOR VISIT:  Scheduled followup.   HISTORY OF PRESENT ILLNESS:  Mr. Charter comes in for regular visit and  followup of paroxysmal atrial fibrillation and previously documented  normal coronary arteries with normal ejection fraction.  He states that  he is due to have a repeat epidural steroid injection in late April for  continued management of lumbar disk disease with some neuropathic  symptoms involving his right leg.  He also states that he is going to be  evaluated by Neurosurgery.  From a cardiac perspective, he reports an  episode of shortness of breath last year requiring use of Lasix  following intravenous fluids given during the hospital stay.  He had an  echocardiogram done in April 2009, which revealed a left ventricular  ejection fraction of 60-65% with moderate left ventricular hypertrophy,  mild mitral regurgitation, trace aortic regurgitation, and trace  tricuspid regurgitation.  He has had no recurrent problems and is  actually interested in cutting back on his Lasix.  He reports a stable  weight overall.  There had been no anginal chest pain.  His  electrocardiogram today shows normal sinus rhythm with a prolonged PR  interval of 256 milliseconds which is increased compared to his prior  tracing.  QRS duration is 138 milliseconds which is fairly stable and  likely consistent with his underlying left ventricular hypertrophy.  Corrected QT interval is 467 milliseconds, also fairly stable.  He has  had no dizziness or syncope.  He continues on Coumadin through the  direction of our Coumadin Clinic and has had no bleeding problems.   ALLERGIES:  INTRAVENOUS CONTRAST and  QUINIDINE.   PRESENT MEDICATIONS:  1. Simvastatin 40 mg p.o. daily.  2. Omeprazole 20 mg p.o. daily.  3. Hydrocodone acetaminophen 5/325 mg p.o. b.i.d.  4. Lasix 40 mg p.o. every other day.  5. Cyclobenzaprine 10 mg p.o. at bedtime.  6. Lyrica 400 mg p.o. b.i.d.  7. Foradil b.i.d.  8. Omega-3 supplements 2000 mg p.o. b.i.d.  9. Trilipix 145 mg p.o. daily.  10.Multivitamin once daily.  11.Avodart 0.5 mg p.o. daily.  12.Benadryl 25 mg 2 tablets p.o. at bedtime.  13.Coumadin as read by the Coumadin Clinic to achieve a therapeutic      INR of 2.0-3.0.  14.Flecainide 150 mg p.o. b.i.d.  15.Lisinopril 10 mg p.o. daily.   REVIEW OF SYSTEMS:  Outlined above.  Otherwise reviewed and negative.   PHYSICAL EXAMINATION:  VITAL SIGNS:  Blood pressure is 131/82, heart  rate is 71 and regular, weight is 274 pounds.  GENERAL:  This is an obese male in no acute distress.  HEENT:  Conjunctiva is normal.  Oropharynx is clear.  NECK:  Supple.  No elevated jugular venous pressure.  No loud bruits or  thyromegaly.  LUNGS:  Clear without loud breathing.  Diminished breath sounds are  noted.  CARDIAC:  Regular rate and rhythm.  No loud systolic murmur or S3  gallop.  ABDOMEN:  Obese, nontender.  Unable to palpate liver edge.  Bowel sounds  present.  EXTREMITIES:  Some venous stasis changes and trace symmetrical edema  below the knees.  Distal pulses 1+.  SKIN:  Warm and dry.  MUSCULOSKELETAL:  No kyphosis noted.  NEUROPSYCHIATRIC:  The patient alert and oriented x3.  Affect is  appropriate.   IMPRESSION AND RECOMMENDATIONS:  1. Paroxysmal atrial fibrillation with a CHADS-2 score of 2,      maintained on Coumadin, and well controlled in normal sinus rhythm      on flecainide.  Intervals overall are stable with the exception of      some increase in PR interval.  This is asymptomatic.  We will plan      to continue the present regimen and I will see him back over the      next 6 months.  2.  Previous documentation of normal coronary arteries and normal      ejection fraction with moderate left ventricular hypertrophy and      what I suspect to be an element of diastolic dysfunction.  This may      relate to his episode of shortness of breath and volume overload      last year, and need for Lasix, at least on a p.r.n. basis.  He does      have some trace lower extremity edema today.  I would like a      followup echocardiogram to reassess left ventricular systolic      function in the meanwhile.  He will use his Lasix on an as-needed      basis at this time.  3. Hypertension, reasonably well controlled.  4. Upcoming epidural steroid injection for late April.  Mr. Dreisbach      should be able to hold Coumadin 5 days prior to this with      resumption thereafter.  We will continue to follow him through our      C[facility]madin Clinic.     Jonelle Sidle, MD  Electronically Signed    SGM/MedQ  DD: 01/29/2009  DT: 01/30/2009  Job #: [id number]   cc:   Erick Colace, M.D.

## 2011-03-02 NOTE — Assessment & Plan Note (Signed)
[facility]                          [facility]   NAME:[name] [name], [name] [name]                       MRN:          [id number]  DATE:05/21/2009                            DOB:          [birth year]    PRIMARY CARE PHYSICIAN:  Erasmo Downer, MD   REASON FOR VISIT:  Preoperative evaluation.   HISTORY OF PRESENT ILLNESS:  [name] [name] presents for a preoperative  evaluation prior to planned right L4-5 and L5-S1 laminectomy, scheduled  for June 02, 2009, with Dr. Tressie Stalker under general anesthesia.  From a cardiac perspective, [name] [name] has been stable.  He has  previously documented normal coronary arteries with normal ejection  fraction and history of paroxysmal atrial fibrillation managed with a  combination of Coumadin (CHADS2 score of 2) and flecainide, with good  rhythm control.  He remains asymptomatic without palpitations or chest  pain.  He has had no major bleeding problems on Coumadin and is followed  through our Coumadin Clinic with INR of 2.9 noted on May 09, 2009.   ALLERGIES:  INTRAVENOUS CONTRAST and QUINIDINE.   PRESENT MEDICATIONS:  1. Simvastatin 40 mg p.o. daily.  2. Hydrocodone p.r.n.  3. Lyrica 400 mg p.o. b.i.d.  4. Omega-3 supplements 1000 mg 2 tablets p.o. b.i.d.  5. Prevacid 15 mg p.o. b.i.d.  6. Lasix 40 mg p.o. q.a.m.  7. Symbicort 160/4.5 b.i.d.  8. Xanax 0.25 mg p.r.n.  9. Foradil Aerolizer b.i.d. p.r.n.  10.Doxepin 25 mg b.i.d. p.r.n.  11.TriCor 145 mg p.o. daily.  12.Benadryl 25 mg 2 tablets p.o. nightly.  13.Coumadin as directed by the Coumadin Clinic.  14.Flecainide 150 mg p.o. b.i.d.  15.Lisinopril 10 mg p.o. daily.   REVIEW OF SYSTEMS:  As outlined above.  Otherwise he has progressive  lower back pain and right leg weakness.  Otherwise, reviewed and are  negative.   PHYSICAL EXAMINATION:  VITAL SIGNS:  Blood pressure is 126/77, heart  rate is 76 and regular, and weight is 291 pounds.  GENERAL:  This is an obese male in no acute distress.  NECK:  No elevated jugular venous pressure.  No loud bruits.  No  thyromegaly is noted.  LUNGS:  Clear without labored breathing at rest.  CARDIAC:  Regular rate and rhythm.  No pathologic systolic murmur or S3  gallop.  PMI is indistinct.  ABDOMEN:  Obese, nontender.  EXTREMITIES:  No frank pitting edema.   A 12-lead electrocardiogram from April at his last visit showed sinus  rhythm at 69 beats per minute with a leftward axis and interventricular  conduction delay with QRS duration of 138 msec.  Corrected QT interval  was 467 msec.   IMPRESSION AND RECOMMENDATIONS:  Preoperative evaluation prior to  planned right L4-5 and L5-S1 laminectomy, scheduled for June 02, 2009  with Dr. Tressie Stalker.  [name] [name] has been symptomatically stable  with history of paroxysmal atrial fibrillation managed with flecainide  and Coumadin (CHADS2 of 2) and previously documented normal coronary  arteries as well as normal ejection fraction.  I do not anticipate any  further cardiac testing prior  to planned surgery and expect that he  should have an acceptable perioperative risk from the perspective of  cardiac complications.  He should clearly continue his flecainide  throughout, although will need to hold Coumadin likely 1 week prior to  his surgery with resumption thereafter.  He is already scheduled for  repeat PT/INR on June 06, 2009 and should keep this visit.  If he  remains stable otherwise, we will plan to see him back over the next 6  months.     Jonelle Sidle, MD  Electronically Signed    SGM/MedQ  DD: 05/21/2009  DT: 05/22/2009  Job #: [id number]   cc:   Erasmo Downer, MD  Cristi Loron, M.D.

## 2011-03-02 NOTE — Assessment & Plan Note (Signed)
[facility]                          [facility]   NAME:[name] [name] SANJUAN                       MRN:          [id number]  DATE:04/30/2008                            DOB:          12/22/1940    PRIMARY CARDIOLOGIST:  [name] Sidle, MD.   REASON FOR VISIT:  Postcatheterization followup.   [name] [name] returns to our clinic, following recent referral for an  elective cardiac catheterization.  This was for further evaluation of  exertional dyspnea in the setting of a recent, abnormal adenosine stress  Cardiolite suggestive of possible inferobasal ischemia.  Of note, the  patient had a previous cardiac catheterization in 1993, by Dr. Peter  [name], revealing normal coronary arteries.   This most recent study, once again, demonstrated normal coronary  arteries and normal left ventricular function.   The patient reports no complications of the right groin incision site.  He does, however, have questions and concerns about remaining on long-  term Coumadin anticoagulation.  We have previously assessed his [place]  score at a level of 2, citing a history of hypertension and diabetes.  Of note, he had been on Coumadin remotely, followed by Dr. Swaziland, but  was then taken off this.  It was resumed earlier this year, when he was  found to be in atrial fibrillation during a brief stay here at [facility].  He was initially followed by [name] [name], but he has since  transferred his care to our Coumadin Clinic.  A recent post  catheterization protime indicated an INR of 1.5.   Clinically, [name] [name] continues to report that his atrial fibrillation  has remained quiescent, following recent up titration of his flecainide  to the current dose.   CURRENT MEDICATIONS:  1. Flecainide 150 b.i.d.  2. Lisinopril 10 daily.  3. Lasix 40 daily.  4. Coumadin 5 mg, as directed.  5. Benadryl 50 nightly.  6. Avodart 0.5 daily.  7. TriCor 145 daily.  8. Simvastatin 40 nightly.  9. Loratadine 10 daily.  10.Omeprazole.  11.Darvocet p.r.n.   PHYSICAL EXAMINATION:  VITAL SIGNS:  Blood pressure 130/72; pulse 73,  regular; and weight 274 (up 2).  GENERAL:  A 70 year old male, sitting upright, no distress.  HEENT:  Normocephalic and atraumatic.  NECK:  Palpable bilateral carotid pulses without bruits; no JVD.  LUNGS:  Clear in the right, markedly diminished on the left; no crackles  or wheezes.  HEART:  RRR (S1S2), no significant murmurs.  No rubs.  ABDOMEN:  Protuberant, nontender.  EXTREMITIES:  Right groin stable with no hematoma, ecchymosis, and  palpable femoral and distal pulse.  NEURO:  No focal deficits.   IMPRESSION:  1. Paroxysmal atrial fibrillation.      a.     Maintaining NSR on flecainide.      b.     CHAD2 score:  2, secondary to hypertension and diabetes.  2. Normal LVF.      a.     Recent coronary angiogram.      b.     False positive adenosine stress Cardiolite, suggestive of  inferobasal ischemia.  3. Hypertension, stable.  4. History of diastolic heart failure.  5. Type 2 diabetes mellitus, diet controlled.  6. Morbid obesity.  7. Contrast dye allergy.  8. Multifactorial dyspnea.  9. Status post pneumonectomy (age 5), secondary to chronic pneumonia.   PLAN:  1. Continue current medication regimen.  We discussed at length the      importance of continuing on Coumadin, given his CHAD2 score of 2.      This represents an annual adjusted stroke rate of 4%.  Given this,      [name] [name] has agreed to remain on Coumadin and we will therefore      continue to monitor him closely in our Coumadin Clinic.  2. Schedule return clinic followup with myself and Dr. Diona [name] in 6      months.      [name] Searing, PA-C  Electronically Signed      [name] Sidle, MD  Electronically Signed   GS/MedQ  DD: 04/30/2008  DT: 05/01/2008  Job #: [id number]   cc:   [name] Downer, MD

## 2011-03-02 NOTE — Assessment & Plan Note (Signed)
[facility]                          [facility]   NAME:Wyer, EILAM SHREWSBURY                       MRN:          [id number]  DATE:04/05/2008                            DOB:          1941-03-10    PRIMARY CARDIOLOGIST:  Jonelle Sidle, MD.   REASON FOR ADMISSION:  Mr. Renaldo is a 70 year old male with no  previously documented history of CAD and history of PAF on chronic  Coumadin, who was previously followed by Dr. Peter Sw[place]iland in [place].  He recently presented to our [facility] clinic so as to establish with Dr.  Diona Browner.  He was referred for a low-level adenosine stress Cardiolite  test to rule out occult ischemia, as well as exercise-induced  ventricular dysrhythmias.  This was reviewed by Dr. Andee Lineman and suggested  a medium-sized, reversible inferobasal defect, consistent with ischemia.  He is now evaluated for diagnostic coronary angiography and possible  percutaneous intervention.   As recently outlined, Mr. Manual denies any history of angina pectoris.  A cardiac catheterization in 1993 by Dr. Peter Swaziland revealed normal  coronary arteries and hyperdynamic LV function.  He has since been  treated with flecainide and Coumadin for PAF.  When seen in our clinic  on May 22, he was in NSR with first-degree AV block.  Flecainide was up  titrated for improved suppression of presumed breakthrough atrial  fibrillation.  He reports today that he has noted palpable improvement  in his symptoms.  Of note, he denies any frank palpitations, but seems  to suggests that he knows he is in atrial fibrillation when he develops  some chest heaviness.   The patient does continue to report exertional dyspnea, but this appears  chronic.  He states that he was recently diagnosed with asthma, as well.  He also refers to the fact that he had a lung resection at an early age  and, therefore, has always been short of breath.   Following recent up  titration of flecainide, a follow-up EKG in our  office indicated NSR with first-degree AV block and stable QT interval.   ALLERGIES:  CONTRAST DYE and QUINIDINE.   CURRENT MEDICATIONS:  1. Coumadin 5 mg as directed followed by Dr. Linna Darner.  2. Lasix 40 daily.  3. Flecainide 150 b.i.d.  4. Lisinopril 10 daily.  5. Lipitor 40 daily.  6. Benadryl 50 nightly.  7. Nexium 40 b.i.d.  8. TriCor 145 daily.   PAST MEDICAL/SOCIAL/FAMILY HISTORY:  Please refer to recent office  consultation note of Mar 08, 2008, for complete details.   REVIEW OF SYSTEMS:  The patient denies any antecedent history of  exertional dyspnea, question of exertional chest discomfort.  Denies any  tachy palpitations.  Otherwise as noted per HPI, remaining systems  negative.   PHYSICAL EXAMINATION:  VITAL SIGNS:  Blood pressure 132/73, pulse 69 and  regular, weight 272.  GENERAL:  A 70 year old male, morbidly obese, sitting upright, no  distress.  HEENT:  Normocephalic, atraumatic.  NECK:  Palpable carotid pulses without bruits; no JVD, at 90 degrees.  LUNGS:  Clear on the right,  markedly diminished on the left; no crackles  or wheezes.  HEART:  Regular rate and rhythm (S1 and S2), no significant murmurs.  No  rubs or gallops.  ABDOMEN:  Soft and nontender.  Intact bowel sounds.  EXTREMITIES:  Trace - 1+ pedal edema with palpable posterior tibialis  pulses.  NEURO:  No focal deficit.   IMPRESSION:  1. Exertional dyspnea.      a.     Question anginal equivalent.      b.     Abnormal adenosine stress Cardiolite, suggestive of       inferobasal ischemia; EF 60%.  2. Preserved left ventricular function.  3. Paroxysmal atrial fibrillation.      a.     Maintaining normal sinus rhythm on flecainide, recently up       titrated.  4. Chronic Coumadin.      a.     [place] II score:  2.  5. Hypertension.  6. Diastolic heart failure.  7. Dyslipidemia.  8. Morbid obesity.  9. Contrast dye allergy.   PLAN:  Following  review with Dr. Nona Dell, recommendation is for  the patient to proceed with diagnostic coronary angiography and possible  percutaneous intervention.  The risks/benefits of the procedure were  discussed in full with the patient, who is agreeable to proceed.  Coumadin will be placed on hold.  He will require premedication for dye  allergy and lasix will be placed on hold, in preparation for his  catheterization.  The patient will also be  started on low-dose aspirin.  Of note, decision as to whether or not to  continue flecainide treatment for his PAF will be deferred, pending  results of the catheterization.      Rozell Searing, PA-C  Electronically Signed      Jonelle Sidle, MD  Electronically Signed   GS/MedQ  DD: 04/05/2008  DT: 04/06/2008  Job #: [id number]   cc:   Erasmo Downer, MD

## 2011-03-02 NOTE — Procedures (Signed)
[name] [name], [name] [name]                ACCOUNT NO.:  [id number]   MEDICAL RECORD NO.:  [id number]          PATIENT TYPE:  REC   LOCATION:  [facility]                          FACILITY:  [facility]   PHYSICIAN:  Erick Colace, M.D.DATE OF BIRTH:  1940/12/23   DATE OF PROCEDURE:  10/31/2008  DATE OF DISCHARGE:                               OPERATIVE REPORT   PROCEDURE:  Right S1 transforaminal lumbar epidural steroid injection  under fluoroscopic guidance.   INDICATION:  Lumbar S1 radiculitis.  He has a history of excellent  response through right S1 transforaminal injection over 1 year response.   He has been off his warfarin for greater than 1 week.   Informed consent was obtained after describing risks and benefits of the  procedure with the patient, these include bleeding, bruising, infection,  loss of bowel or bladder function, temporary or permanent paralysis.  Potential benefits include improvement in gait, improvement in sleep,  improvement in sitting, and bending tolerance.   The patient placed prone on fluoroscopy table.  Betadine prep, sterile  drape, 25-gauge 1.5 inch needle was used to anesthetize the skin and  subcutaneous tissue with 1% lidocaine x2 mL and 22-gauge 3.5 inch spinal  needle was inserted under fluoroscopic guidance into the right S1  foramen.  AP and lateral images utilized.  Omnipaque 180 under live  fluoro x2 mL showed good epidural and nerve root spread followed by  injection of 1 mL of 10 mg/mL of dexamethasone and 1 mL of 1% MPF  lidocaine.  The patient tolerated the procedure well.  Pre and post  injection vitals stable.  Post injection instructions given.  I will see  him back in 1 month to see how he is doing.  We may need to repeat  injection as we have in the past.      Erick Colace, M.D.  Electronically Signed     AEK/MEDQ  D:  10/31/2008 13:21:46  T:  11/01/2008 03:04:14  Job:  [id number]

## 2011-03-02 NOTE — Assessment & Plan Note (Signed)
[facility]                          [facility]   NAME:[name] [name], [name] [name]                       MRN:          [id number]  DATE:03/08/2008                            DOB:          Feb 18, 1941    REFERRING PHYSICIAN:  Dr. Linna Darner   REASON FOR REFERRAL:  Atrial fibrillation.   HISTORY OF PRESENT ILLNESS:  [name] [name] is a 70 year old male patient  previously followed by Dr. Peter Sw[place]and in [place] who has a history  of paroxysmal atrial fibrillation maintained on flecainide therapy as  well as Coumadin therapy.  He elected to follow up with his primary care  physician over the last several years.  He is now in need of a  cardiologist and would like to establish care with Dr. Simona Huh.   The patient had done well up until recently.  He developed an abscess on  his neck.  This was positive for MRSA.  He underwent incision and  drainage and his wound is now closing with secondary intention.  Of  note, electrocardiogram from the hospital stay does reveal atrial  fibrillation with a heart rate in the 120s.  The patient apparently  received significant amounts of IV fluids and presented back to the  hospital at the end of March with mild congestive heart failure.  He was  diuresed and eventually discharged to home.  Since his previous  hospitalization at the end of March with congestive heart failure he has  continued to complain of symptoms of paroxysmal atrial fibrillation.   His symptoms of atrial fibrillation include chest heaviness.  He will  note this until his atrial fibrillation goes away.  This has been his  symptom since he was first diagnosed with atrial fibrillation many years  ago.  He denies any radiating symptoms to his neck or arms.  He denies  any significant shortness of breath, syncope or near-syncope.  He denies  any exertional chest heaviness or tightness.  He does have exertional  shortness of breath.  This is related  to left pneumonectomy as a child.  There has been no significant change recently.  He is probably more  short of breath with exertion over the last several years.  He denies  orthopnea, PND or pedal edema.  He does have a cough that is productive  of green sputum.  He denies any fevers or chills.  He was recently  placed on Symbicort and his cough seems to be resolving.   Of note, the patient was evaluated by our service in 2006 at [facility].  At that point time he presented with digoxin toxicity with  significant bradycardia.  His digoxin was discontinued at that time.  He  was also on verapamil at that time but his medication list does not  indicate that he is on verapamil any longer.   PAST MEDICAL HISTORY:  1. Paroxysmal atrial fibrillation.      a.     Maintained on flecainide therapy.      b.     Coumadin therapy followed by Dr. Linna Darner.  2. Chronic  diastolic congestive heart failure.  3. Good LV function with an EF of 60-65%.  A 2-D echocardiogram January 17, 2008, revealing moderate concentric LVH, basal septal      hypertrophy without evidence of systolic anterior motion, biatrial      enlargement, mild mitral regurgitation, trace tricuspid      regurgitation.  4. Diabetes mellitus.  Now diet-controlled - the patient's sugars have      normalized since losing 60 pounds some years ago.  5. Hypertension.  6. Treated dyslipidemia.  7. Status post left pneumonectomy at age 86 secondary to chronic      pneumonia.  8. Questionable asthma.  9. Allergies.  10.GERD.  11.Benign prostatic hypertrophy.  12.Degenerative disk disease with chronic left radicular symptoms.  13.Nephrolithiasis.  14.History of tonsillectomy.   MEDICATIONS:  1. Flecainide 100 mg b.i.d.  2. Singulair 10 mg daily.  3. Docusate sodium 100 mg daily.  4. TriCor 145 mg daily.  5. Diovan 40 mg daily.  6. Nexium 40 mg b.i.d.  7. Multivitamin daily.  8. Avodart 0.5 mg daily.  9. B12 2000 mg  daily.  10.Benadryl q.h.s.  11.Lipitor 40 mg daily.  12.Lyrica 75 mg b.i.d.  13.Symbicort two puffs b.i.d.  14.Coumadin as directed by Dr. Linna Darner.  15.Lasix 40 mg daily.  16.Albuterol p.r.n.   ALLERGIES:  IV DYE and QUINIDINE both cause rashes.   SOCIAL HISTORY:  The patient denies any significant history of tobacco  abuse.  Denies any alcohol abuse.  He is married and has no children.  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  He plans  to retire in several days.   FAMILY HISTORY:  Father died of heart attack at age 87.  His mother had  diabetes and died from complications at age 30.  His brother died of  pancreatic cancer at age 75.   REVIEW OF SYSTEMS:  Please see HPI.  Denies any fever or chills, melena,  hematochezia, hematuria, dysuria.  Rest of the review of systems are  negative.   PHYSICAL EXAMINATION:  He is well-nourished, well-developed male in no  acute distress.  Blood pressure is 117/78, pulse 70, weight 272 pounds.  HEENT:  Normal.  NECK:  Without JVD.  CARDIAC:  Normal S1, S2.  Regular rate and rhythm.  LUNGS:  Clear to auscultation bilaterally.  ABDOMEN:  Soft, nontender.  EXTREMITIES:  Without edema.  NEUROLOGIC:  He is alert and oriented x3.  Cranial nerves II-XII grossly  intact.  VASCULAR EXAM:  Without carotid bruits bilaterally.  ENDOCRINE:  Without thyromegaly.  LYMPH:  Without lymphadenopathy.   Electrocardiogram reveals sinus rhythm, heart rate of 73, left axis  deviation, left anterior fascicular block, LVH , first-degree AV block  with PR interval of 240 milliseconds, QTC at 440 milliseconds.   IMPRESSION:  1. Paroxysmal atrial fibrillation.      a.     Flecainide therapy.      b.     Coumadin therapy followed by Dr. Linna Darner.  2. Good left ventricular function.  3. History of diastolic congestive heart failure.  4. Diabetes mellitus.  No medical therapy since losing weight many      years ago.  5. Hypertension.  6.  Hyperlipidemia.  7. Status post left pneumonectomy at age 37.  8. Asthma.   PLAN:  1. Mr. Posadas presents for further evaluation of paroxysmal atrial      fibrillation.  After further review with Dr. Diona Browner  we have      recommended proceeding with an increase in his flecainide therapy      to 150 mg b.i.d.  2. He will have a stress Cardiolite to rule out ischemic heart disease      as well as to rule out exercise-induced ventricular arrhythmias.      If he cannot walk on a treadmill, we will change him to low-level      stress adenosine Cardiolite.  3. He will get an EKG in a week to follow up on his QTC interval.  4. The patient would like to change his Diovan to a cheaper drug.      Will discontinue his Diovan and place him on lisinopril 10 mg a      day.  5. He will get a BMET in 1 week's time to follow up his renal function      potassium, given the change in his medications.  Will also check a      TSH at that time to rule out thyroid disease as the cause of his      paroxysmal atrial fibrillation.  6. The patient will be brought back in follow-up with Dr. Diona Browner in      the next 4 weeks to follow up on the above.      Tereso Newcomer, PA-C  Electronically Signed      Jonelle Sidle, MD  Electronically Signed   SW/MedQ  DD: 03/08/2008  DT: 03/08/2008  Job #: [id number]   cc:   Erasmo Downer, MD

## 2011-03-02 NOTE — Assessment & Plan Note (Signed)
[name] [name] returns today.  He has a history of lumbar stenosis,  combination of degenerative disk, as well as foraminal stenosis.  MRI  demonstrated some anterolisthesis at L3, L4, and L5 right greater than  left and lateral recess narrowing at that level, moderate right  foraminal narrowing at L5-S1.  He has had S1 transforaminal, which in  the past was quite helpful for him.  He trialed it again on October 31, 2008, not as helpful and an another epidural at L3 and L4.   He noted to have some lateral recess stenosis.  Discussed my plan with  the patient in regards to trialing another level at L5-S1, but in the  translaminar route.  No significant central stenosis at that level.   We will go ahead.  We checked urine drug screen, which is appropriate  back about 2 months ago given his increased pain.  We will switch him  from tramadol to hydrocodone 5/325 t.i.d.   Failing this may need to send him back to Dr. Sharolyn Douglas.   His exam today; he has a mild right foot drop, graded as 4/5 compared to  5/5 on the left side.  Knee extensor and hip flexor strength are normal.  His ambulation is without evidence to toe drag or knee instability.  His  back has mild tenderness to palpation in the lumbar paraspinals.   IMPRESSION:  Lumbar spinal stenosis with radiculitis.  We will go ahead  and repeat injection.   PLAN:  As noted above, discussed with the patient in agreement.      Erick Colace, M.D.  Electronically Signed     AEK/MedQ  D:  01/16/2009 15:44:15  T:  01/17/2009 02:53:25  Job #:  [id number]

## 2011-03-02 NOTE — Assessment & Plan Note (Signed)
[name] [name] returns today, a 70 year old male, with lumbar spinal  stenosis and lumbosacral radiculitis mainly at the S1 level, has  previous good relief with S1 transforaminal injection, which lasted  about a year but this time he had about a 50% improvement of his right  lower extremity pain for only 2 days with ESI.  He has had no weakness  of his leg.  His pain is 9/10.  Sleep is poor.  Pain is worse with  walking, better with medication.   Current pain medications, Lyrica 75 b.i.d.  Her has some Valium, does  not have any Flexeril at home.  In the past, he has been on Endocet,  currently not.   Looking back, previous treatments also had a right L4 transforaminal  injection.   PHYSICAL EXAMINATION:  He has decreased deep tendon reflex of the  patella and right ankle, normal on the left.  Normal strength in  bilateral lower extremities, normal range of motion.  Gait is normal.   IMPRESSION:  Lumbar spinal stenosis with radiculitis, he may have  multilevel.  His last MRI was in 2006, which showed stenosis mainly at  L5-S1 but also to a lesser degree L4-5, which I suspect may have  progressed given his lack of reflex at L3-4 myotome.   PLAN:  We will start him on tramadol 50 p.o. b.i.d., Flexeril 10 mg p.o.  b.i.d. 1/2 to 1 tablet, and Lyrica increased to 100 mg b.i.d.      Erick Colace, M.D.  Electronically Signed     AEK/MedQ  D:  11/29/2008 17:49:39  T:  11/30/2008 04:41:55  Job #:  [id number]

## 2011-03-05 NOTE — Procedures (Signed)
NAMEHARSHA, YUSKO                ACCOUNT NO.:  [id number]   MEDICAL RECORD NO.:  [id number]          PATIENT TYPE:  REC   LOCATION:  [facility]                          FACILITY:  [facility]   PHYSICIAN:  Erick Colace, M.D.DATE OF BIRTH:  06/13/1941   DATE OF PROCEDURE:  10/04/2005  DATE OF DISCHARGE:                                 OPERATIVE REPORT   MEDICAL RECORD NUMBER:  [id number].   The patient returns today. He has had two transforaminal epidural steroid  injections, last one on September 06, 2005 which was right S1 transforaminal.  He had preinjection pain levels of 10, postinjection 0, and did well for a  couple of weeks until he went to a convention where he was doing a lot more  walking than usual.   Previous injection was right L4, transforaminal, and this was not as  effective.   INDICATIONS:  L5-S1 foraminal stenosis, disk herniation and facet  arthropathy, only partially responsive to narcotic analgesics.   Informed consent was obtained after describing risks and benefits of the  procedure to the patient. These include bleeding, bruising, infection, loss  of bowel and bladder function, temporary or permanent paralysis, and he  elects to proceed and has given written consent. The patient placed prone on  fluoroscopy table, Betadine prep, sterile drape. A 25-gauge 1-1/2-inch  needle was used to anesthetize skin and subcu tissues with 2 cc of 1%  lidocaine. Then, a 22-gauge 3-1/2-inch spinal needle was inserted at the  right S1 foramen under fluoroscopic guidance under both AP and lateral  imaging. Then, Omnipaque 180 under live fluoro using IV extension tubing was  injected x0.3 cc, demonstrating good nerve root outline and epidural spread.   Then a solution containing 1 cc of 40 mg/cc of Depo-Medrol and 2 cc of  methylparaben-free 1% lidocaine was injected. The patient tolerated the  procedure well. Post-injection instructions given. Pre-injection pain level  5/10,  post-injection 0/10. I will see him back in two months. Should he need  another injection, we could set up in about another month's time from there.  He can follow up with Dr. Arletha Grippe if no significant, at least  intermediate term improvement from this injection.      Erick Colace, M.D.  Electronically Signed     AEK/MEDQ  D:  10/04/2005 10:50:58  T:  10/05/2005 13:49:01  Job:  [id number]   cc:   Kinnie Scales Plomaritis  Fax: [phone]

## 2011-03-05 NOTE — Assessment & Plan Note (Signed)
MEDICAL RECORD NUMBER:  [id number].   The patient returns today. He had a right S1 transforaminal epidural steroid  injection October 04, 2005. He has had some improvement in right lower  extremity pain since that. He has mainly axial pain now. He was given some  samples of Lyrica 50 mg t.i.d. by his primary care physician and felt like  this helped him.   He is interested in taking something that he only needs to try twice a day.   His average pain is down to 1/10. He walks without assistance. 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   REVIEW OF SYSTEMS:  Negative.   SOCIAL HISTORY:  Lives with his wife.   PHYSICAL EXAMINATION:  VITAL SIGNS:  Blood pressure is 154/59, pulse 72,  respiratory rate 16, O2 saturation 97% on room air.  GENERAL:  No acute distress. Mood and affect appropriate.   His back has no tenderness to palpation. His spine range of motion is 75% in  forward flexion and extension. He has normal deep tendon reflexes and normal  strength in lower extremities.   IMPRESSION:  Right L5-S1 foraminal stenosis, disk herniation and facet  arthropathy with right L5-S1 radiculopathy, improved after a combination of  epidural steroid injection and usage of Lyrica.   PLAN:  1.  We will write a prescription for Lyrica 75 p.o. b.i.d.  2.  I will follow up with him on a p.r.n. basis. If he has worsening of his      right lower extremity discomfort, he may benefit from a repeat      injection, but I would hope that we can hold off on this for another      several months. He will follow up with Dr. Arletha Grippe as well as primary      care physician.      Erick Colace, M.D.  Electronically Signed     AEK/MedQ  D:  12/03/2005 13:56:40  T:  12/03/2005 16:33:47  Job #:  [id number]   cc:   Kinnie Scales Plomaritis  Fax: [phone]

## 2011-03-05 NOTE — Procedures (Signed)
[name], [name]                ACCOUNT NO.:  [id number]   MEDICAL RECORD NO.:  [id number]          PATIENT TYPE:  REC   LOCATION:  [facility]                          FACILITY:  [facility]   PHYSICIAN:  Erick Colace, M.D.DATE OF BIRTH:  Aug 06, 1941   DATE OF PROCEDURE:  08/12/2005  DATE OF DISCHARGE:                                 OPERATIVE REPORT   MEDICAL RECORD NUMBER:  [id number].   PROCEDURE:  Right L4 transforaminal epidural steroid injection under  fluoroscopic guidance.   INDICATIONS:  Right L4 and L5 radiculopathy due to foraminal stenosis,  moderate L4-5 and severe L5-S1. Pain currently 10/10. He is not taking any  anticoagulant medications. He did hold his baby aspirin. Pain is only  partially responsive to Endocet.   Informed consent was obtained after describing risks and benefits of the  procedure to the patient. These include bleeding, bruising, infection, loss  of bowel and bladder function, temporary or permanent paralysis, and he  elects to proceed.   The patient placed prone on fluoroscopy table, Betadine prep, sterile drape.  A 25-gauge 1-1/2-inch needle was used to anesthetize skin and subcu tissues  with 1% lidocaine x 2 cc. Then, a 22-gauge 3-1/2-inch spinal needle was  inserted under fluoroscopic guidance into the right L4-5 intervertebral  foramen. Then a solution containing Omnipaque 180 x0.5 cc demonstrated good  flow, and this was followed by injection of 1 cc of 40 mg/cc of Depo-Medrol  plus 2 cc of methylparaben-free lidocaine. The patient tolerated the  procedure well. Post injection instructions given. Return in three weeks.  Prescription for Endocet 10/650 1 p.o. t.i.d. p.r.n., hoping to reduce  narcotic analgesic usage. Will start some physical therapy after the next  injection.      Erick Colace, M.D.  Electronically Signed     AEK/MEDQ  D:  08/12/2005 17:31:29  T:  08/13/2005 11:51:20  Job:  [id number]

## 2011-03-05 NOTE — Group Therapy Note (Signed)
[name] is a 70 year old male with right lower extremity pain x4-5  months, insidious in onset, with some improvement over time. He has been  fairly judicious in his use of medications including Endocet and Valium, has  been on Celebrex 200 mg a day chronically, as well as glucosamine for  arthritis in his hips. He saw his orthopedist, checked hip films showing  osteoarthritis bilateral hips. MRI of bilateral hips showed no evidence of  AVN. His lumbar MRI demonstrated moderate stenosis central L4-5 and left  greater than right foraminal stenosis, probable bilateral 4 nerve root  encroachment. At L5-S1 he has severe right-sided foraminal stenosis,  although central canal did not have any significant stenosis. He had facet  hypertrophy L4-5, L5-S1 and to a lesser extent L2-3 and 3-4. He has not had  any injections, no physical therapy.   He rates his average pain as 5/10, currently just at sitting he is 0, but  when he gets up to standing, moving, he has pain up to a 10/10 level and  pain interferes with general activity at 10/10 level, relationship with  other people at 10, and enjoyment of life 10. His pain is worse at night.  Sleep is poor although it does improve when he takes a Valium and Endocet  prescribed by his orthopedist. 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 He  has some anxiety, depression, but no suicidal thoughts. He has some trouble  walking, spasm in his back, numbness, tremor, tingling in his lowers.   PAST MEDICAL HISTORY:  Significant for atrial fibrillation, hyperlipidemia,  hypertension, but does not take any Coumadin. Formerly requiring insulin as  a diabetic, now is diet controlled after a 50-pound weight loss.   CURRENT MEDICATIONS:  Include FiberLax, Lipitor, verapamil, Diovan,  glucosamine, aspirin 81  mg a day, TriCor, Celebrex 200 mg a day, Lanoxin,  Prevacid, and oxybutynin for overactive bladder.   SOCIAL HISTORY:  He is married, his wife accompanies him. She is a mental  health R.N.   FAMILY HISTORY:  Significant for diabetes and high blood pressure.   Blood pressure is 113/59, pulse 59, respirations 16, O2 saturation 100% on  room air.  GENERAL:  No acute distress, mood and affect appropriate.  EXTREMITIES:  Without clubbing, cyanosis, or edema. He has no sensory  deficit in the lower extremity. He has normal deep tendon reflexes at the  knees but none at the ankle.   He has normal muscle bulk. He is able to toe-walk and heel-walk. He has 75%  forward flexion of the spine, 75% extension. He has mildly decreased hip  internal rotation bilaterally. Otherwise, normal lower extremity range. His  sensation reduced in his feet compared to his knees.   IMPRESSION:  1.  Lumbar spinal stenosis with right L5 radiculitis causing lower extremity      pain.  2.  Lumbar spinal stenosis.  3.  Lumbar facet arthropathy.   PLAN:  1.  Will do transforaminal injection. Given the severe stenosis at L5 will      likely need to approach it from L4 given still good access to L5 nerve      root at that level and adequate spread anticipated. I will see him back      for the  injection and as discussed with the patient likely will need physical      therapy in conjunction with the above.  2.  No change in p.o. pain medications. He really takes them fairly      judiciously, mainly at night.      Erick Colace, M.D.  Electronically Signed     AEK/MedQ  D:  06/15/2005 12:46:58  T:  06/15/2005 13:31:49  Job #:  [id number]

## 2011-03-05 NOTE — Procedures (Signed)
NAMETIBOR, LEMMONS                ACCOUNT NO.:  [id number]   MEDICAL RECORD NO.:  [id number]          PATIENT TYPE:  REC   LOCATION:  [facility]                          FACILITY:  [facility]   PHYSICIAN:  Erick Colace, M.D.DATE OF BIRTH:  [birth year]   DATE OF PROCEDURE:  09/06/2005  DATE OF DISCHARGE:                                 OPERATIVE REPORT   MEDICAL RECORD NUMBER:  [id number].   HISTORY:  A 70 year old male who has multilevel lumbar stenosis with right  lower extremity radicular symptoms referable to L4/L5 levels. Had a right L4  transforaminal epidural steroid injection under fluoroscopic guidance. He  had a tight foramen but no complications. He did not get much relief with  the last injection. Given he has symptoms referable down to the L5 level as  well as S1 level, we will try S1 transforaminal epidural steroid injection  under fluoroscopic guidance to see if this affords him better relief and  more delivery of corticosteroid to target area.   Informed consent was obtained after describing risks and benefits of the  procedure to the patient. These include bleeding, bruising, infection, loss  of bowel and bladder function, temporary or permanent paralysis, and he  elects to proceed and has given written consent. The patient placed prone on  fluoroscopy table, Betadine prep, sterile drape.  A 25-gauge 1-1/4-inch  needle was used to anesthetize skin and subcu tissues with 1% lidocaine x2  cc. Then a 22-gauge 3-1/2-inch spinal needle was inserted under fluoroscopic  guidance into the right S1 foramen, confirmed with lateral imaging. Then  Omnipaque 180 under live fluoro x0.5 cc demonstrated no intravascular  uptake. Then a solution containing 1 cc of 40 mg/cc of Depo-Medrol plus 2 cc  of 1% methylparaben-free lidocaine were injected. The patient tolerated the  procedure well. Pre-injection pain level 10/10. Post-injection pain level  0/10. Follow up in three weeks for possible  reinjection.      Erick Colace, M.D.  Electronically Signed     AEK/MEDQ  D:  09/06/2005 12:09:00  T:  09/06/2005 14:43:59  Job:  [id number]   cc:   Plomaritis, M.D.  [place]

## 2011-06-10 ENCOUNTER — Other Ambulatory Visit: Payer: Self-pay | Admitting: Neurosurgery

## 2011-06-10 DIAGNOSIS — M549 Dorsalgia, unspecified: Secondary | ICD-10-CM

## 2011-06-17 ENCOUNTER — Other Ambulatory Visit: Payer: Self-pay

## 2011-06-24 ENCOUNTER — Ambulatory Visit
Admission: RE | Admit: 2011-06-24 | Discharge: 2011-06-24 | Disposition: A | Discharge status: Home / Self Care | Payer: Medicare Other | Source: Ambulatory Visit | Attending: Neurosurgery | Admitting: Neurosurgery

## 2011-06-24 DIAGNOSIS — M549 Dorsalgia, unspecified: Secondary | ICD-10-CM

## 2011-06-24 MED ORDER — GADOBENATE DIMEGLUMINE 529 MG/ML IV SOLN
20.0000 mL | Freq: Once | INTRAVENOUS | Status: AC | PRN
  Administered 2011-06-24: 20 mL via INTRAVENOUS

## 2011-07-15 LAB — CBC
HCT: 42.7
Hemoglobin: 14.5
MCHC: 34
MCV: 88.2
Platelets: 168
RBC: 4.84
RDW: 16.4 — ABNORMAL HIGH
WBC: 8.9

## 2011-07-15 LAB — BASIC METABOLIC PANEL
CO2: 25
Chloride: 105
Creatinine, Ser: 1.3
GFR calc Af Amer: >60
Potassium: 4.5
Sodium: 141

## 2011-07-15 LAB — PROTIME-INR
INR: 1.1
Prothrombin Time: 14.2

## 2011-08-12 ENCOUNTER — Other Ambulatory Visit (HOSPITAL_COMMUNITY): Payer: Self-pay | Admitting: Orthopedic Surgery

## 2011-08-12 ENCOUNTER — Encounter (HOSPITAL_COMMUNITY): Payer: Self-pay

## 2011-08-12 ENCOUNTER — Ambulatory Visit (HOSPITAL_COMMUNITY): Admission: RE | Admit: 2011-08-12 | Payer: Medicare Other | Source: Ambulatory Visit

## 2011-08-12 ENCOUNTER — Encounter (HOSPITAL_COMMUNITY)
Admission: RE | Admit: 2011-08-12 | Discharge: 2011-08-12 | Disposition: A | Discharge status: Home / Self Care | Payer: Medicare Other | Source: Ambulatory Visit | Attending: Orthopedic Surgery | Admitting: Orthopedic Surgery

## 2011-08-12 ENCOUNTER — Ambulatory Visit (HOSPITAL_COMMUNITY)
Admission: RE | Admit: 2011-08-12 | Discharge: 2011-08-12 | Disposition: A | Discharge status: Home / Self Care | Payer: Medicare Other | Source: Ambulatory Visit | Attending: Orthopedic Surgery | Admitting: Orthopedic Surgery

## 2011-08-12 DIAGNOSIS — M169 Osteoarthritis of hip, unspecified: Secondary | ICD-10-CM

## 2011-08-12 DIAGNOSIS — Z01810 Encounter for preprocedural cardiovascular examination: Secondary | ICD-10-CM | POA: Insufficient documentation

## 2011-08-12 DIAGNOSIS — Z01818 Encounter for other preprocedural examination: Secondary | ICD-10-CM | POA: Insufficient documentation

## 2011-08-12 DIAGNOSIS — Z01812 Encounter for preprocedural laboratory examination: Secondary | ICD-10-CM | POA: Insufficient documentation

## 2011-08-12 HISTORY — DX: Unspecified osteoarthritis, unspecified site: M19.90

## 2011-08-12 HISTORY — DX: Gastro-esophageal reflux disease without esophagitis: K21.9

## 2011-08-12 HISTORY — DX: Unspecified visual loss: H54.7

## 2011-08-12 HISTORY — DX: Chronic obstructive pulmonary disease, unspecified: J44.9

## 2011-08-12 HISTORY — DX: Acute upper respiratory infection, unspecified: J06.9

## 2011-08-12 HISTORY — DX: Other amnesia: R41.3

## 2011-08-12 LAB — CBC
MCV: 88.9 fL (ref 78.0–100.0)
Platelets: 168 10*3/uL (ref 150–400)
RBC: 4.95 MIL/uL (ref 4.22–5.81)
RDW: 13.7 % (ref 11.5–15.5)
WBC: 9.9 10*3/uL (ref 4.0–10.5)

## 2011-08-12 LAB — COMPREHENSIVE METABOLIC PANEL
ALT: 31 U/L (ref 0–53)
AST: 21 U/L (ref 0–37)
CO2: 26 mEq/L (ref 19–32)
Calcium: 10.2 mg/dL (ref 8.4–10.5)
Chloride: 105 mEq/L (ref 96–112)
Creatinine, Ser: 0.73 mg/dL (ref 0.50–1.35)
GFR calc Af Amer: >90 mL/min (ref >90)
GFR calc non Af Amer: >90 mL/min (ref >90)
Glucose, Bld: 118 mg/dL — ABNORMAL HIGH (ref 70–99)
Total Bilirubin: 0.6 mg/dL (ref 0.3–1.2)

## 2011-08-12 LAB — PROTIME-INR
INR: 1.76 — ABNORMAL HIGH (ref 0.00–1.49)
Prothrombin Time: 20.8 seconds — ABNORMAL HIGH (ref 11.6–15.2)

## 2011-08-12 LAB — APTT: aPTT: 32 seconds (ref 24–37)

## 2011-08-12 LAB — URINALYSIS, ROUTINE W REFLEX MICROSCOPIC
Glucose, UA: NEGATIVE mg/dL
Leukocytes, UA: NEGATIVE
Nitrite: NEGATIVE
Protein, ur: NEGATIVE mg/dL

## 2011-08-12 LAB — DIFFERENTIAL
Basophils Absolute: 0.1 10*3/uL (ref 0.0–0.1)
Basophils Relative: 1 % (ref 0–1)
Eosinophils Absolute: 0.3 10*3/uL (ref 0.0–0.7)
Eosinophils Relative: 3 % (ref 0–5)
Lymphs Abs: 3.2 10*3/uL (ref 0.7–4.0)
Neutrophils Relative %: 56 % (ref 43–77)

## 2011-08-12 LAB — SURGICAL PCR SCREEN: Staphylococcus aureus: NEGATIVE

## 2011-08-12 MED ORDER — SODIUM CHLORIDE 0.9 % IV SOLN: INTRAVENOUS | Status: DC

## 2011-08-12 MED ORDER — CHLORHEXIDINE GLUCONATE 4 % EX LIQD: 60.0000 mL | Freq: Once | CUTANEOUS | Status: DC

## 2011-08-12 MED ORDER — ACETAMINOPHEN 10 MG/ML IV SOLN: 1000.0000 mg | Freq: Once | INTRAVENOUS | Status: DC

## 2011-08-12 NOTE — Patient Instructions (Addendum)
[address]  08/12/2011   Your procedure is scheduled on:  November 5  Report to [facility] at 8:25 AM.  Call this number if you have problems the morning of surgery: [phone]   Remember:   Do not eat food:After Midnight.  Do not drink clear liquids: 4 Hours before arrival.  Take these medicines the morning of surgery with A SIP OF WATER: albuterol if needed (bring with you day of surgery), HYDROcodone-acetaminophen if needed  Do not wear jewelry, make-up or nail polish.  Do not wear lotions, powders, or perfumes. You may wear deodorant.  Do not shave 48 hours prior to surgery.  Do not bring valuables to the hospital  Contacts, dentures or bridgework may not be worn into surgery.  Leave suitcase in the car. After surgery it may be brought to your room.  For patients admitted to the hospital, checkout time is 11:00 AM the day of discharge.   Patients discharged the day of surgery will not be allowed to drive home.  Name and phone number of your driver:   Special Instructions: CHG Shower Use Special Wash: 1/2 bottle night before surgery and 1/2 bottle morning of surgery. Hibiclens shower 3 days preop.   Please read over the following fact sheets that you were given: Pain Booklet, Coughing and Deep Breathing, Blood Transfusion Information and Total Joint Packet

## 2011-08-12 NOTE — Pre-Procedure Instructions (Signed)
Requested stress test from [facility], ECHO from Dr. Sherryll Burger in [place].

## 2011-08-13 LAB — URINE CULTURE
Colony Count: NO GROWTH
Culture: NO GROWTH

## 2011-08-22 MED ORDER — VANCOMYCIN HCL 1000 MG IV SOLR
1500.0000 mg | Freq: Once | INTRAVENOUS | Status: DC
  Filled 2011-08-22: qty 1500

## 2011-08-23 ENCOUNTER — Encounter (HOSPITAL_COMMUNITY): Payer: Self-pay | Admitting: *Deleted

## 2011-08-23 ENCOUNTER — Encounter (HOSPITAL_COMMUNITY): Payer: Self-pay | Admitting: Certified Registered"

## 2011-08-23 ENCOUNTER — Encounter (HOSPITAL_COMMUNITY)
Admission: RE | Disposition: A | Discharge status: Home Health | Payer: Self-pay | Source: Ambulatory Visit | Attending: Orthopedic Surgery

## 2011-08-23 ENCOUNTER — Inpatient Hospital Stay (HOSPITAL_COMMUNITY)
Admission: RE | Admit: 2011-08-23 | Discharge: 2011-08-26 | DRG: 470 | Disposition: A | Discharge status: Home Health | Payer: Medicare Other | Source: Ambulatory Visit | Attending: Orthopedic Surgery | Admitting: Orthopedic Surgery

## 2011-08-23 ENCOUNTER — Inpatient Hospital Stay (HOSPITAL_COMMUNITY): Payer: Medicare Other | Admitting: Certified Registered"

## 2011-08-23 DIAGNOSIS — E119 Type 2 diabetes mellitus without complications: Secondary | ICD-10-CM | POA: Diagnosis present

## 2011-08-23 DIAGNOSIS — Z91041 Radiographic dye allergy status: Secondary | ICD-10-CM

## 2011-08-23 DIAGNOSIS — M169 Osteoarthritis of hip, unspecified: Principal | ICD-10-CM | POA: Diagnosis present

## 2011-08-23 DIAGNOSIS — I4891 Unspecified atrial fibrillation: Secondary | ICD-10-CM | POA: Diagnosis present

## 2011-08-23 DIAGNOSIS — J449 Chronic obstructive pulmonary disease, unspecified: Secondary | ICD-10-CM | POA: Diagnosis present

## 2011-08-23 DIAGNOSIS — I10 Essential (primary) hypertension: Secondary | ICD-10-CM | POA: Diagnosis present

## 2011-08-23 DIAGNOSIS — M1610 Unilateral primary osteoarthritis, unspecified hip: Principal | ICD-10-CM | POA: Diagnosis present

## 2011-08-23 DIAGNOSIS — Z79899 Other long term (current) drug therapy: Secondary | ICD-10-CM

## 2011-08-23 DIAGNOSIS — J4489 Other specified chronic obstructive pulmonary disease: Secondary | ICD-10-CM | POA: Diagnosis present

## 2011-08-23 DIAGNOSIS — Z7901 Long term (current) use of anticoagulants: Secondary | ICD-10-CM

## 2011-08-23 DIAGNOSIS — K219 Gastro-esophageal reflux disease without esophagitis: Secondary | ICD-10-CM | POA: Diagnosis present

## 2011-08-23 DIAGNOSIS — N400 Enlarged prostate without lower urinary tract symptoms: Secondary | ICD-10-CM | POA: Diagnosis present

## 2011-08-23 DIAGNOSIS — M1710 Unilateral primary osteoarthritis, unspecified knee: Secondary | ICD-10-CM | POA: Diagnosis present

## 2011-08-23 HISTORY — PX: TOTAL HIP ARTHROPLASTY: SHX124

## 2011-08-23 LAB — PROTIME-INR: INR: 1.17 (ref 0.00–1.49)

## 2011-08-23 LAB — GLUCOSE, CAPILLARY: Glucose-Capillary: 134 mg/dL — ABNORMAL HIGH (ref 70–99)

## 2011-08-23 SURGERY — ARTHROPLASTY, HIP, TOTAL,POSTERIOR APPROACH
Anesthesia: General | Laterality: Right

## 2011-08-23 MED ORDER — METOCLOPRAMIDE HCL 5 MG PO TABS
5.0000 mg | ORAL_TABLET | Freq: Three times a day (TID) | ORAL | Status: DC | PRN
  Filled 2011-08-23: qty 2

## 2011-08-23 MED ORDER — VANCOMYCIN HCL 1000 MG IV SOLR
1000.0000 mg | INTRAVENOUS | Status: DC | PRN
  Administered 2011-08-23: 1000 mg via INTRAVENOUS

## 2011-08-23 MED ORDER — SODIUM CHLORIDE 0.9 % IV SOLN
INTRAVENOUS | Status: DC
  Administered 2011-08-23: 16:00:00 via INTRAVENOUS

## 2011-08-23 MED ORDER — FINASTERIDE 5 MG PO TABS
5.0000 mg | ORAL_TABLET | Freq: Every day | ORAL | Status: DC
  Administered 2011-08-23 – 2011-08-26 (×4): 5 mg via ORAL
  Filled 2011-08-23 (×4): qty 1

## 2011-08-23 MED ORDER — DONEPEZIL HCL 10 MG PO TABS
10.0000 mg | ORAL_TABLET | Freq: Every day | ORAL | Status: DC
  Administered 2011-08-23 – 2011-08-25 (×3): 10 mg via ORAL
  Filled 2011-08-23 (×4): qty 1

## 2011-08-23 MED ORDER — ONDANSETRON HCL 4 MG/2ML IJ SOLN: 4.0000 mg | Freq: Four times a day (QID) | INTRAMUSCULAR | Status: DC | PRN

## 2011-08-23 MED ORDER — DIPHENHYDRAMINE HCL 12.5 MG/5ML PO ELIX
12.5000 mg | ORAL_SOLUTION | ORAL | Status: DC | PRN
  Filled 2011-08-23: qty 10

## 2011-08-23 MED ORDER — HYDROMORPHONE HCL PF 1 MG/ML IJ SOLN
0.5000 mg | INTRAMUSCULAR | Status: DC | PRN
  Administered 2011-08-23 – 2011-08-24 (×3): 1 mg via INTRAVENOUS
  Filled 2011-08-23 (×3): qty 1

## 2011-08-23 MED ORDER — LACTATED RINGERS IV SOLN
INTRAVENOUS | Status: DC | PRN
  Administered 2011-08-23 (×2): via INTRAVENOUS

## 2011-08-23 MED ORDER — VANCOMYCIN HCL IN DEXTROSE 1-5 GM/200ML-% IV SOLN
1000.0000 mg | Freq: Two times a day (BID) | INTRAVENOUS | Status: AC
  Filled 2011-08-23: qty 200

## 2011-08-23 MED ORDER — SENNOSIDES-DOCUSATE SODIUM 8.6-50 MG PO TABS: 1.0000 | ORAL_TABLET | Freq: Every evening | ORAL | Status: DC | PRN

## 2011-08-23 MED ORDER — ONDANSETRON HCL 4 MG/2ML IJ SOLN
INTRAMUSCULAR | Status: DC | PRN
  Administered 2011-08-23: 4 mg via INTRAVENOUS

## 2011-08-23 MED ORDER — FENTANYL CITRATE 0.05 MG/ML IJ SOLN
INTRAMUSCULAR | Status: DC | PRN
  Administered 2011-08-23: 150 ug via INTRAVENOUS

## 2011-08-23 MED ORDER — NEOSTIGMINE METHYLSULFATE 1 MG/ML IJ SOLN
INTRAMUSCULAR | Status: DC | PRN
  Administered 2011-08-23: 5 mg via INTRAVENOUS

## 2011-08-23 MED ORDER — ROCURONIUM BROMIDE 100 MG/10ML IV SOLN
INTRAVENOUS | Status: DC | PRN
  Administered 2011-08-23: 50 mg via INTRAVENOUS

## 2011-08-23 MED ORDER — METHOCARBAMOL 100 MG/ML IJ SOLN
500.0000 mg | Freq: Four times a day (QID) | INTRAVENOUS | Status: DC | PRN
  Administered 2011-08-23: 500 mg via INTRAVENOUS
  Filled 2011-08-23 (×2): qty 5

## 2011-08-23 MED ORDER — LISINOPRIL 10 MG PO TABS
10.0000 mg | ORAL_TABLET | Freq: Every day | ORAL | Status: DC
  Administered 2011-08-23 – 2011-08-26 (×3): 10 mg via ORAL
  Filled 2011-08-23 (×4): qty 1

## 2011-08-23 MED ORDER — METOCLOPRAMIDE HCL 5 MG/ML IJ SOLN
5.0000 mg | Freq: Three times a day (TID) | INTRAMUSCULAR | Status: DC | PRN
  Filled 2011-08-23: qty 2

## 2011-08-23 MED ORDER — DOCUSATE SODIUM 100 MG PO CAPS
100.0000 mg | ORAL_CAPSULE | Freq: Two times a day (BID) | ORAL | Status: DC
  Administered 2011-08-23 – 2011-08-26 (×6): 100 mg via ORAL
  Filled 2011-08-23 (×6): qty 1

## 2011-08-23 MED ORDER — WARFARIN SODIUM 10 MG PO TABS
10.0000 mg | ORAL_TABLET | Freq: Once | ORAL | Status: AC
  Administered 2011-08-23: 10 mg via ORAL
  Filled 2011-08-23: qty 1

## 2011-08-23 MED ORDER — PANTOPRAZOLE SODIUM 20 MG PO TBEC
20.0000 mg | DELAYED_RELEASE_TABLET | Freq: Every day | ORAL | Status: DC
  Administered 2011-08-24 – 2011-08-26 (×4): 20 mg via ORAL
  Filled 2011-08-23 (×6): qty 1

## 2011-08-23 MED ORDER — ONDANSETRON HCL 4 MG PO TABS: 4.0000 mg | ORAL_TABLET | Freq: Four times a day (QID) | ORAL | Status: DC | PRN

## 2011-08-23 MED ORDER — OXYCODONE HCL 20 MG PO TB12
20.0000 mg | ORAL_TABLET | Freq: Two times a day (BID) | ORAL | Status: DC
  Administered 2011-08-23 – 2011-08-26 (×6): 20 mg via ORAL
  Filled 2011-08-23 (×6): qty 1

## 2011-08-23 MED ORDER — HYDROMORPHONE HCL PF 1 MG/ML IJ SOLN
INTRAMUSCULAR | Status: AC
  Filled 2011-08-23: qty 1

## 2011-08-23 MED ORDER — SIMVASTATIN 40 MG PO TABS
40.0000 mg | ORAL_TABLET | Freq: Every day | ORAL | Status: DC
  Administered 2011-08-23 – 2011-08-25 (×3): 40 mg via ORAL
  Filled 2011-08-23 (×4): qty 1

## 2011-08-23 MED ORDER — LACTATED RINGERS IV SOLN
INTRAVENOUS | Status: DC
  Administered 2011-08-23: 11:00:00 via INTRAVENOUS

## 2011-08-23 MED ORDER — FLECAINIDE ACETATE 50 MG PO TABS
150.0000 mg | ORAL_TABLET | Freq: Every day | ORAL | Status: DC
  Administered 2011-08-23 – 2011-08-26 (×4): 150 mg via ORAL
  Filled 2011-08-23 (×4): qty 1

## 2011-08-23 MED ORDER — FLEET ENEMA 7-19 GM/118ML RE ENEM: 1.0000 | ENEMA | Freq: Every day | RECTAL | Status: DC | PRN

## 2011-08-23 MED ORDER — GLYCOPYRROLATE 0.2 MG/ML IJ SOLN
INTRAMUSCULAR | Status: DC | PRN
  Administered 2011-08-23: .6 mg via INTRAVENOUS

## 2011-08-23 MED ORDER — CELECOXIB 200 MG PO CAPS
200.0000 mg | ORAL_CAPSULE | Freq: Two times a day (BID) | ORAL | Status: DC
  Administered 2011-08-23 – 2011-08-26 (×6): 200 mg via ORAL
  Filled 2011-08-23 (×7): qty 1

## 2011-08-23 MED ORDER — ZOLPIDEM TARTRATE 5 MG PO TABS: 5.0000 mg | ORAL_TABLET | Freq: Every evening | ORAL | Status: DC | PRN

## 2011-08-23 MED ORDER — PROPOFOL 10 MG/ML IV EMUL
INTRAVENOUS | Status: DC | PRN
  Administered 2011-08-23: 200 mg via INTRAVENOUS

## 2011-08-23 MED ORDER — GLIPIZIDE ER 5 MG PO TB24
5.0000 mg | ORAL_TABLET | Freq: Every day | ORAL | Status: DC
  Administered 2011-08-24 – 2011-08-26 (×3): 5 mg via ORAL
  Filled 2011-08-23 (×4): qty 1

## 2011-08-23 MED ORDER — HYDROMORPHONE HCL PF 1 MG/ML IJ SOLN
INTRAMUSCULAR | Status: AC
  Administered 2011-08-23: 18:00:00
  Filled 2011-08-23: qty 1

## 2011-08-23 MED ORDER — ENOXAPARIN SODIUM 40 MG/0.4ML ~~LOC~~ SOLN
40.0000 mg | SUBCUTANEOUS | Status: DC
  Administered 2011-08-23 – 2011-08-25 (×3): 40 mg via SUBCUTANEOUS
  Filled 2011-08-23 (×4): qty 0.4

## 2011-08-23 MED ORDER — MIDAZOLAM HCL 5 MG/5ML IJ SOLN
INTRAMUSCULAR | Status: DC | PRN
  Administered 2011-08-23: 2 mg via INTRAVENOUS

## 2011-08-23 MED ORDER — HYDROMORPHONE HCL PF 1 MG/ML IJ SOLN
0.2500 mg | INTRAMUSCULAR | Status: DC | PRN
  Administered 2011-08-23 (×4): 0.5 mg via INTRAVENOUS

## 2011-08-23 MED ORDER — ALBUTEROL SULFATE HFA 108 (90 BASE) MCG/ACT IN AERS
2.0000 | INHALATION_SPRAY | Freq: Four times a day (QID) | RESPIRATORY_TRACT | Status: DC | PRN
  Filled 2011-08-23: qty 6.7

## 2011-08-23 MED ORDER — METHOCARBAMOL 500 MG PO TABS
500.0000 mg | ORAL_TABLET | Freq: Four times a day (QID) | ORAL | Status: DC | PRN
  Administered 2011-08-24 – 2011-08-26 (×5): 500 mg via ORAL
  Filled 2011-08-23 (×6): qty 1

## 2011-08-23 MED ORDER — OXYCODONE HCL 5 MG PO TABS
5.0000 mg | ORAL_TABLET | ORAL | Status: DC | PRN
  Administered 2011-08-24: 10 mg via ORAL
  Administered 2011-08-24: 5 mg via ORAL
  Administered 2011-08-25 – 2011-08-26 (×5): 10 mg via ORAL
  Filled 2011-08-23 (×4): qty 2
  Filled 2011-08-23: qty 1
  Filled 2011-08-23 (×2): qty 2

## 2011-08-23 SURGICAL SUPPLY — 49 items
BLADE EXTENDED ELECTROD 809319 (BLADE) ×2 IMPLANT
BLADE SAW RECIP 87.9 MT (BLADE) ×2 IMPLANT
BRUSH FEMORAL CANAL (MISCELLANEOUS) IMPLANT
CLOTH BEACON ORANGE TIMEOUT ST (SAFETY) ×2 IMPLANT
COVER BACK TABLE 24X17X13 BIG (DRAPES) ×2 IMPLANT
COVER SURGICAL LIGHT HANDLE (MISCELLANEOUS) ×4 IMPLANT
DRAPE HIP W/POCKET STRL (DRAPE) ×2 IMPLANT
DRAPE INCISE IOBAN 66X45 STRL (DRAPES) ×2 IMPLANT
DRAPE U-SHAPE 47X51 STRL (DRAPES) ×2 IMPLANT
DRSG MEPILEX BORDER 4X12 (GAUZE/BANDAGES/DRESSINGS) ×2 IMPLANT
DRSG MEPILEX BORDER 4X8 (GAUZE/BANDAGES/DRESSINGS) IMPLANT
DURAPREP 26ML APPLICATOR (WOUND CARE) ×4 IMPLANT
ELECT BLADE 4.0 EZ CLEAN MEGAD (MISCELLANEOUS) ×2
ELECT CAUTERY BLADE 6.4 (BLADE) ×2 IMPLANT
ELECT REM PT RETURN 9FT ADLT (ELECTROSURGICAL) ×2
ELECTRODE BLDE 4.0 EZ CLN MEGD (MISCELLANEOUS) ×1 IMPLANT
ELECTRODE REM PT RTRN 9FT ADLT (ELECTROSURGICAL) ×1 IMPLANT
FACESHIELD LNG OPTICON STERILE (SAFETY) ×8 IMPLANT
GLOVE BIOGEL M 7.0 STRL (GLOVE) ×2 IMPLANT
GLOVE BIOGEL PI IND STRL 7.5 (GLOVE) ×1 IMPLANT
GLOVE BIOGEL PI IND STRL 8.5 (GLOVE) ×2 IMPLANT
GLOVE BIOGEL PI INDICATOR 7.5 (GLOVE) ×1
GLOVE BIOGEL PI INDICATOR 8.5 (GLOVE) ×2
GLOVE SURG ORTHO 8.0 STRL STRW (GLOVE) ×4 IMPLANT
GOWN PREVENTION PLUS XLARGE (GOWN DISPOSABLE) ×4 IMPLANT
GOWN STRL NON-REIN LRG LVL3 (GOWN DISPOSABLE) ×4 IMPLANT
HANDPIECE INTERPULSE COAX TIP (DISPOSABLE)
HOOD PEEL AWAY FACE SHEILD DIS (HOOD) ×8 IMPLANT
KIT BASIN OR (CUSTOM PROCEDURE TRAY) ×2 IMPLANT
KIT ROOM TURNOVER OR (KITS) ×2 IMPLANT
NDL SUT 2 .5 CRC MAYO 1.732X (NEEDLE) ×1 IMPLANT
NEEDLE MAYO TAPER (NEEDLE) ×1
NS IRRIG 1000ML POUR BTL (IV SOLUTION) ×2 IMPLANT
PACK TOTAL JOINT (CUSTOM PROCEDURE TRAY) ×2 IMPLANT
PAD ARMBOARD 7.5X6 YLW CONV (MISCELLANEOUS) ×2 IMPLANT
PASSER SUT SWANSON 36MM LOOP (INSTRUMENTS) ×2 IMPLANT
PILLOW ABDUCTION HIP (SOFTGOODS) ×2 IMPLANT
PRESSURIZER FEMORAL UNIV (MISCELLANEOUS) IMPLANT
SET HNDPC FAN SPRY TIP SCT (DISPOSABLE) IMPLANT
STAPLER VISISTAT 35W (STAPLE) ×2 IMPLANT
SUT ETHIBOND 2 V 37 (SUTURE) ×2 IMPLANT
SUT VIC AB 0 CTX 27 (SUTURE) ×4 IMPLANT
SUT VIC AB 1 CTB1 27 (SUTURE) ×4 IMPLANT
SUT VIC AB 2-0 CTB1 (SUTURE) ×4 IMPLANT
TOWEL OR 17X24 6PK STRL BLUE (TOWEL DISPOSABLE) ×2 IMPLANT
TOWEL OR 17X26 10 PK STRL BLUE (TOWEL DISPOSABLE) ×2 IMPLANT
TOWER CARTRIDGE SMART MIX (DISPOSABLE) IMPLANT
TRAY FOLEY CATH 14FR (SET/KITS/TRAYS/PACK) ×2 IMPLANT
WATER STERILE IRR 1000ML POUR (IV SOLUTION) ×8 IMPLANT

## 2011-08-23 NOTE — Preoperative (Signed)
Beta Blockers   Reason not to administer Beta Blockers:Not Applicable 

## 2011-08-23 NOTE — Progress Notes (Signed)
ANTICOAGULATION CONSULT NOTE - Initial Consult  Pharmacy Consult for Warfarin Indication: Hx/o Afib with subtherapeutic INR and VTE prophylaxis s/p R THA  Allergies  Allergen Reactions  . Penicillins   . Contrast Media (Iodinated Diagnostic Agents) Rash  . Quinine Derivatives Rash    quinidine    Patient Measurements: Height: 6\' 5"  (195.6 cm) Weight: 285 lb (129.275 kg) IBW/kg (Calculated) : 89.1  Adjusted Body Weight:   Vital Signs: Temp: 97.6 F (36.4 C) (11/05 2100) Temp src: Oral (11/05 1652) BP: 121/75 mmHg (11/05 2100) Pulse Rate: 83  (11/05 2100)  Labs:  Basename 08/23/11 2125  HGB --  HCT --  PLT --  APTT --  LABPROT 15.1  INR 1.17  HEPARINUNFRC --  CREATININE --  CKTOTAL --  CKMB --  TROPONINI --   Estimated Creatinine Clearance: 129.7 ml/min (by C-G formula based on Cr of 0.73).  Medical History: Past Medical History  Diagnosis Date  . PONV (postoperative nausea and vomiting)   . Dysrhythmia     A Fib  . Hypertension   . COPD (chronic obstructive pulmonary disease)   . Bronchiectasis     L lung lobe removed  . Recurrent upper respiratory infection (URI)     finished Z pack recently   . Productive cough     chronic  . Diabetes mellitus   . Blood transfusion 1959    with lung lobectomy surgery  . GERD (gastroesophageal reflux disease)   . Arthritis   . Impaired vision     wears contacts or glasses  . Memory difficulties     pt having forgetfullness, taking Aricept preventatively, father had Alzheimers    Medications:  Prescriptions prior to admission  Medication Sig Dispense Refill  . albuterol (PROVENTIL HFA;VENTOLIN HFA) 108 (90 BASE) MCG/ACT inhaler Inhale 2 puffs into the lungs every 6 (six) hours as needed. For shortness of breath       . donepezil (ARICEPT) 10 MG tablet Take 10 mg by mouth at bedtime.        . finasteride (PROSCAR) 5 MG tablet Take 5 mg by mouth daily.        . flecainide (TAMBOCOR) 150 MG tablet Take 150 mg by  mouth daily.        Marland Kitchen glipiZIDE (GLUCOTROL XL) 5 MG 24 hr tablet Take 5 mg by mouth daily.        Marland Kitchen HYDROcodone-acetaminophen (NORCO) 10-325 MG per tablet Take 1 tablet by mouth every 6 (six) hours as needed. For pain.      Marland Kitchen lansoprazole (PREVACID) 15 MG capsule Take 15 mg by mouth 2 (two) times daily.        Marland Kitchen lisinopril (PRINIVIL,ZESTRIL) 10 MG tablet Take 10 mg by mouth daily.        . metFORMIN (GLUCOPHAGE) 500 MG tablet Take 250 mg by mouth daily.        Marland Kitchen OVER THE COUNTER MEDICATION Take 1 tablet by mouth 2 (two) times daily. Osteo bi flex       . simvastatin (ZOCOR) 40 MG tablet Take 40 mg by mouth at bedtime.        Marland Kitchen warfarin (COUMADIN) 10 MG tablet Take 10 mg by mouth See admin instructions. Takes 10 mg by mouth on Tuesday, Thursday, Sunday      . warfarin (COUMADIN) 5 MG tablet Take 5 mg by mouth See admin instructions. Takes 5 mg by mouth on Monday, Wednesday, Friday, and Saturday.       Scheduled:    .  celecoxib  200 mg Oral BID  . docusate sodium  100 mg Oral BID  . donepezil  10 mg Oral QHS  . enoxaparin  40 mg Subcutaneous Q24H  . finasteride  5 mg Oral Daily  . flecainide  150 mg Oral Daily  . glipiZIDE  5 mg Oral Q breakfast  . HYDROmorphone      . HYDROmorphone      . lisinopril  10 mg Oral Daily  . oxyCODONE  20 mg Oral Q12H  . pantoprazole  20 mg Oral Q1200  . simvastatin  40 mg Oral QHS  . vancomycin  1,000 mg Intravenous Q12H  . DISCONTD: vancomycin  1,500 mg Intravenous Once    Assessment: 70 y.o. M to resume warfarin for VTE px s/p R THA and hx/o Afib with subtherapeutic INR. PTA the patient was taking 5 mg daily EXCEPT for 10 mg on Tues, Thurs, Sat. INR 1.17.   Goal of Therapy: INR 2-3   Plan:  1. Warfarin 10 mg x 1 dose at 1800 today 2. Daily PT/INR 3) Warfarin education during this hospital admission 4) Will re-dose with daily INR results and monitor for any s/sx of bleeding.  Rolley Sims 08/23/2011,10:40 PM

## 2011-08-23 NOTE — Op Note (Signed)
Total Hip Replacement Operative Report:  08/23/2011  3:03 PM  PATIENT:  Wandra Scot  70 y.o. male  PRE-OPERATIVE DIAGNOSIS:  OA right hip  POST-OPERATIVE DIAGNOSIS:  OA right hip  PROCEDURE:  Procedure(s): TOTAL HIP ARTHROPLASTY  SURGEON:  Surgeon(s): Raymon Mutton, MD  PHYSICIAN ASSISTANT: Altamese Cabal, [facility]  ANESTHESIA:   general  DRAINS: Hemovac and On-Q Marcaine Pain Pump  SPECIMEN: None  COUNTS:  Correct  TOURNIQUET:  * No tourniquets in log *  DICTATION:   Indication for procedure:    The patient is a 70 y.o. male who has failed conservative measures for @PREOPDX @.  Informed consent was obtained prior to anesthesia.  The risks versus benefits of the surgery was explained in terms that they can and did understand.  Description:  The patient was taken to the operating and administered general anesthesia.  They were then place in the lateral decubitis position with an axillary roll under the axilla and all areas padded.  The hip was prepped and draped in the usual sterile fashion.  A curvilinear incision was made with a #10 blade.  A new blade was used to incise the adipose layer down to the fascia.  The cautery was used to obtain hemostasis in this layer.  The bovie was used to divide the fascia lata and a charnley retractor was placed to exposed the lateral aspect of the hip.  The anterior one half of the gluteus medius and vastus lateralis was incised and a continuous sleeve elevated of the femur, including the gluteus minimus.  This sleeve was tagged with #2 tevdec sutures. A large curved homan was used to protect this sleeve and exposed the anterior hip joint capsule.  An anterior hip capsulectomy was performed with the bovie and forceps.  The leg was extended and externally rotatated.  The neck cutting guide was laid on the femoral neck and marked.  The neck was then cut with a reciprocating saw.  The head and neck segment was removed with a reaming corkscrew  device.  The curved homan was repositioned over the anterior acetabular rim.  A second curved homan was place over the posterior acetabular lip and retracted the femur.  The labral was removed circumferentially with the bovie.  I then switched sides of the table to the front of the patient.  The acetabulum was reamed with the reamers up to 58 mm.  A trial was then placed and checked for stability and sizing.  The trial was then removed.  The fiber mesh titanium acetabular component was then opened and was tamped into place in appropriate abduction and anteversion.  A standard 2+ polyethlene liner was tamped into place as well.  I then went back to the posterior side of the table to prepare the femur.  The leg was flexed at the hip and knee and place in a sterile pouch off the table, exposing the cut surface of the femoral neck. A Mueller retractor was used to deliver the femoral neck for reaming.  The lateralizing reamer was used first to prevent varus positioning of the component.  I then sequentially ream the canal up to 16 mm. I then broached the femur up to the corresponding size to match the reamer.  I then used the calcar planer to plan the portion of the proud femoral neck above the final trial broach.  A +0 head by 32mm head was then trialed with reduction of the hip and full range of motion checked for adequate  stability.  I then dislocated the hip and removed the trial femoral components.  I then copiously irrigated.  The size 16 femoral component was opened and then tamped down into the femoral canal with a mallet.  The +0 by 32mm head was tamped onto the clean morse taper of the femoral component.  The hip was then reduced and tested for stability.  I then repaired the vastus lateralis/gluteus medius/gluteus minimus sleeve to the trochanter through small drill holes.  I oversewed the interval with several figure eight #2 tevdec sutures.  The charnley retractor was removed and the fascia lata  closed with a running #1 vicryl stitch.    The deep soft tissues were closed with burried #0 vicryl sutures.  The subcutilar layer was closed with a running #2 vicryl stitch.  The skin was closed with skin staples.  The wound was dressed with a mepilex dressing.  The patient was then laid supine, awakened, extubated, and taken to the recovery room in stable condition.  EBL:  300cc DRAINS: None COMPLICATIONS:  None

## 2011-08-23 NOTE — Anesthesia Preprocedure Evaluation (Addendum)
Anesthesia Evaluation  Patient identified by MRN, date of birth, ID band Patient awake    Reviewed: Allergy & Precautions, H&P , NPO status , Patient's Chart, lab work & pertinent test results  History of Anesthesia Complications (+) PONV and Family history of anesthesia reaction  Airway Mallampati: II TM Distance: >3 FB Neck ROM: Full    Dental  (+) Teeth Intact   Pulmonary COPD COPD inhaler, Recent URI , Resolved,          Cardiovascular hypertension, Pt. on medications     Neuro/Psych Negative Neurological ROS  Negative Psych ROS   GI/Hepatic Neg liver ROS, GERD-  Medicated and Controlled,  Endo/Other  Diabetes mellitus-, Well Controlled, Type 2  Renal/GU negative Renal ROS  Genitourinary negative   Musculoskeletal   Abdominal (+) obese,  Abdomen: soft.    Peds  Hematology   Anesthesia Other Findings   Reproductive/Obstetrics negative OB ROS                         Anesthesia Physical Anesthesia Plan  ASA: III  Anesthesia Plan: General   Post-op Pain Management:    Induction: Intravenous  Airway Management Planned: Oral ETT  Additional Equipment:   Intra-op Plan:   Post-operative Plan: Extubation in OR  Informed Consent: I have reviewed the patients History and Physical, chart, labs and discussed the procedure including the risks, benefits and alternatives for the proposed anesthesia with the patient or authorized representative who has indicated his/her understanding and acceptance.   Dental advisory given  Plan Discussed with: CRNA and Anesthesiologist  Anesthesia Plan Comments:        Anesthesia Quick Evaluation

## 2011-08-23 NOTE — H&P (Signed)
[name] [name], [name] [name]                ACCOUNT NO.:  [id number]  MEDICAL RECORD NO.:  [id number]  LOCATION:  [facility]                         FACILITY:  [facility]  PHYSICIAN:  Mila Homer. Sherlean Foot, M.D. DATE OF BIRTH:  12-Jun-1941  DATE OF ADMISSION:  08/23/2011 DATE OF DISCHARGE:                             HISTORY & PHYSICAL   CHIEF COMPLAINT:  Right hip pain.  HISTORY OF PRESENT ILLNESS:  A 70 year old male complaining of pain in the right hip times several years, getting more severe, now interfering with activities of daily living.  Conservative treatment including NSAIDs and injections have failed.  Risks and benefits of surgery were discussed with the patient.  ALLERGIES:  QUININE, FLOMAX, IV CONTRAST DYE, and DOXYCYCLINE.  PAST MEDICAL HISTORY:  Hypertension, type 2 diabetes, spinal stenosis, stasis dermatitis, bladder incontinence, prostatitis, chronic obstructive lung disease, atrial fibrillation, upper respiratory infection, benign prostatic hyperplasia, left spine surgery, rash, nonsurgical osteoarthritis of the right knee, GERD, nausea and vomiting.  SOCIAL HISTORY:  Tobacco use.  MEDICATIONS:  On admission were, 1. Endocet 10/325 one tablet q.6 p.r.n. pain. 2. Glipizide XL 5 mg 1 tablet daily. 3. Zocor 40 mg daily. 4. Coumadin 10 mg daily. 5. Aricept 10 mg daily. 6. Lisinopril 10 mg daily. 7. VESIcare 5 mg daily. 8. Proscar 5 mg daily. 9. Metformin 500 mg twice daily. 10.Prevacid 15 mg 1 tablet twice daily.  PHYSICAL EXAM:  VITAL SIGNS:  Temperature is 97.2, pulse 63, respirations 16, BP is 125/76. GENERAL:  The patient's height is 6 feet 5 inches and he is 285 pounds. HEENT:  Within normal limits. NECK:  Within normal limits. CHEST/LUNGS:  Clear to auscultation bilaterally. CARDIAC:  Regular rate and rhythm. ABDOMEN:  Positive bowel sounds, nontender. CNS:  Alert and orient x3. SKIN:  Intact left lower extremity. MUSCULOSKELETAL:  Internal and external range of motion  was limited with pain in the groin.  Neurovascularly intact.  Ligamentously stable.  IMPRESSION:  Right hip osteoarthritis.  PLAN:  Right total hip arthroplasty.    ______________________________ Altamese Cabal, PA-C   ______________________________ Mila Homer. Sherlean Foot, M.D.    MJ/MEDQ  D:  08/23/2011  T:  08/23/2011  Job:  [id number]

## 2011-08-23 NOTE — Anesthesia Postprocedure Evaluation (Signed)
Anesthesia Post Note  Patient: [name] [name]  Procedure(s) Performed:  TOTAL HIP ARTHROPLASTY  Anesthesia type: General  Patient location: PACU  Post pain: Pain level controlled and Adequate analgesia  Post assessment: Post-op Vital signs reviewed, Patient's Cardiovascular Status Stable, Respiratory Function Stable, Patent Airway and Pain level controlled  Last Vitals:  Filed Vitals:   08/23/11 1500  BP:   Pulse: 74  Temp:   Resp: 22    Post vital signs: Reviewed and stable  Level of consciousness: awake, alert  and oriented  Complications: No apparent anesthesia complications

## 2011-08-23 NOTE — Transfer of Care (Signed)
Immediate Anesthesia Transfer of Care Note  Patient: [name] [name]  Procedure(s) Performed:  TOTAL HIP ARTHROPLASTY  Patient Location: PACU  Anesthesia Type: General  Level of Consciousness: awake, alert , oriented and patient cooperative  Airway & Oxygen Therapy: Patient Spontanous Breathing and Patient connected to nasal cannula oxygen  Post-op Assessment: Report given to PACU RN, Post -op Vital signs reviewed and stable and Patient moving all extremities  Post vital signs: stable  Complications: No apparent anesthesia complications

## 2011-08-23 NOTE — Anesthesia Procedure Notes (Addendum)
Procedure Name: Intubation Performed by: Cecilie Kicks Pre-anesthesia Checklist: Patient identified, Emergency Drugs available, Suction available, Patient being monitored and Timeout performed Patient Re-evaluated:Patient Re-evaluated prior to inductionOxygen Delivery Method: Circle System Utilized Preoxygenation: Pre-oxygenation with 100% oxygen Intubation Type: IV induction Ventilation: Mask ventilation without difficulty Laryngoscope Size: Mac and 3 Grade View: Grade II Tube type: Oral Tube size: 7.5 mm Number of attempts: 1    Performed by: Cecilie Kicks    Performed by: Cecilie Kicks Oxygen Delivery Method: Nasal Cannula

## 2011-08-24 LAB — BASIC METABOLIC PANEL
BUN: 18 mg/dL (ref 6–23)
Chloride: 102 mEq/L (ref 96–112)
Glucose, Bld: 160 mg/dL — ABNORMAL HIGH (ref 70–99)
Potassium: 4.1 mEq/L (ref 3.5–5.1)
Sodium: 137 mEq/L (ref 135–145)

## 2011-08-24 LAB — CBC
HCT: 32 % — ABNORMAL LOW (ref 39.0–52.0)
Hemoglobin: 10.8 g/dL — ABNORMAL LOW (ref 13.0–17.0)
MCH: 30.4 pg (ref 26.0–34.0)
MCHC: 33.8 g/dL (ref 30.0–36.0)
RBC: 3.55 MIL/uL — ABNORMAL LOW (ref 4.22–5.81)

## 2011-08-24 MED ORDER — WARFARIN SODIUM 10 MG PO TABS
10.0000 mg | ORAL_TABLET | Freq: Once | ORAL | Status: AC
  Administered 2011-08-24: 10 mg via ORAL
  Filled 2011-08-24: qty 1

## 2011-08-24 MED ORDER — PATIENT'S GUIDE TO USING COUMADIN BOOK
Freq: Once | Status: AC
  Administered 2011-08-24: 15:00:00
  Filled 2011-08-24: qty 1

## 2011-08-24 MED ORDER — WARFARIN VIDEO
Freq: Once | Status: DC
  Filled 2011-08-24: qty 1

## 2011-08-24 MED FILL — Chlorhexidine Gluconate Liquid 4%: CUTANEOUS | Qty: 118 | Status: AC

## 2011-08-24 NOTE — Progress Notes (Signed)
ANTICOAGULATION CONSULT NOTE - Follow Up Consult  Pharmacy Consult for Coumadin Indication: Hx/o Afib with subtherapeutic INR and VTE prophylaxis s/p R THA   Allergies  Allergen Reactions  . Penicillins   . Contrast Media (Iodinated Diagnostic Agents) Rash  . Quinine Derivatives Rash    quinidine    Patient Measurements: Height: 6\' 5"  (195.6 cm) Weight: 285 lb (129.275 kg) IBW/kg (Calculated) : 89.1    Vital Signs: Temp: 98.4 F (36.9 C) (11/06 0522) BP: 107/64 mmHg (11/06 0522) Pulse Rate: 77  (11/06 0522)  Labs:  Alvira Philips 08/24/11 0647 08/23/11 2125  HGB 10.8* --  HCT 32.0* --  PLT 112* --  APTT -- --  LABPROT 16.1* 15.1  INR 1.26 1.17  HEPARINUNFRC -- --  CREATININE 0.81 --  CKTOTAL -- --  CKMB -- --  TROPONINI -- --   Estimated Creatinine Clearance: 128.1 ml/min (by C-G formula based on Cr of 0.81).   Medications:  Scheduled:    . celecoxib  200 mg Oral BID  . docusate sodium  100 mg Oral BID  . donepezil  10 mg Oral QHS  . enoxaparin  40 mg Subcutaneous Q24H  . finasteride  5 mg Oral Daily  . flecainide  150 mg Oral Daily  . glipiZIDE  5 mg Oral Q breakfast  . HYDROmorphone      . HYDROmorphone      . lisinopril  10 mg Oral Daily  . oxyCODONE  20 mg Oral Q12H  . pantoprazole  20 mg Oral Q1200  . simvastatin  40 mg Oral QHS  . vancomycin  1,000 mg Intravenous Q12H  . warfarin  10 mg Oral Once  . DISCONTD: vancomycin  1,500 mg Intravenous Once    Assessment: Pt is a 70 y/o male on chronic coumadin for history of A-fib now s/p R-THA.  He is restarted on Coumadin with Lovenox until INR is therapeutic. Goal of Therapy:  INR 2-3   Plan:  Lovenox 40mg  sq daily. Coumadin 10mg  today. Coumadin booklet and video.  Chirstine Defrain, Elisha Headland, Pharm.D. 08/24/2011 12:00 PM

## 2011-08-24 NOTE — Progress Notes (Signed)
PATIENT ID:      [name] [name]  MRN:     [id number] DOB/AGE:    [birth year] / 70 y.o.    PROGRESS NOTE Subjective:  negative for Chest Pain  negative for Shortness of Breath  negative for Nausea/Vomiting   negative for Calf Pain  negative for Bowel Movement   Tolerating Diet: yes         Patient reports pain as 3 on 0-10 scale.    Objective: Vital signs in last 24 hours: Temp:  [97.6 F (36.4 C)-98.7 F (37.1 C)] 98.4 F (36.9 C) (11/06 0522) Pulse Rate:  [68-83] 77  (11/06 0522) Resp:  [14-26] 20  (11/06 0522) BP: (107-145)/(63-82) 107/64 mmHg (11/06 0522) SpO2:  [93 %-100 %] 95 % (11/06 0522) Weight:  [129.275 kg (285 lb)] 285 lb (129.275 kg) (11/05 0906)    Intake/Output from previous day: I/O last 3 completed shifts: In: 3170 [P.O.:420; I.V.:2750] Out: 975 [Urine:450; Blood:525]   Intake/Output this shift:     LABORATORY DATA: @LABLAST (WBC:3,HGB:3,HCT:3,PLT:3) Sodium  Date/Time Value Range Status  08/24/2011  6:47 AM 137  135-145 (mEq/L) Final  08/12/2011 11:00 AM 142  135-145 (mEq/L) Final  04/02/2010  3:55 AM 138  135-145 (mEq/L) Final     Potassium  Date/Time Value Range Status  08/24/2011  6:47 AM 4.1  3.5-5.1 (mEq/L) Final  08/12/2011 11:00 AM 4.5  3.5-5.1 (mEq/L) Final  04/02/2010  3:55 AM 4.5  3.5-5.1 (mEq/L) Final     Chloride  Date/Time Value Range Status  08/24/2011  6:47 AM 102  96-112 (mEq/L) Final  08/12/2011 11:00 AM 105  96-112 (mEq/L) Final  04/02/2010  3:55 AM 104  96-112 (mEq/L) Final     CO2  Date/Time Value Range Status  08/24/2011  6:47 AM 25  19-32 (mEq/L) Final  08/12/2011 11:00 AM 26  19-32 (mEq/L) Final  04/02/2010  3:55 AM 27  19-32 (mEq/L) Final     BUN  Date/Time Value Range Status  08/24/2011  6:47 AM 18  6-23 (mg/dL) Final  40/98/1191 47:82 AM 15  6-23 (mg/dL) Final  9/56/2130  8:65 AM 18  6-23 (mg/dL) Final     Creatinine, Ser  Date/Time Value Range Status  08/24/2011  6:47 AM 0.81  0.50-1.35 (mg/dL) Final  78/46/9629  52:84 AM 0.73  0.50-1.35 (mg/dL) Final  1/32/4401  0:27 AM 1.18  0.4-1.5 (mg/dL) Final     Glucose, Bld  Date/Time Value Range Status  08/24/2011  6:47 AM 160* 70-99 (mg/dL) Final  25/36/6440 34:74 AM 118* 70-99 (mg/dL) Final  2/59/5638  7:56 AM 140* 70-99 (mg/dL) Final     Calcium  Date/Time Value Range Status  08/24/2011  6:47 AM 8.8  8.4-10.5 (mg/dL) Final  43/32/9518 84:16 AM 10.2  8.4-10.5 (mg/dL) Final  03/24/3015  0:10 AM 8.3* 8.4-10.5 (mg/dL) Final   Lab Results  Component Value Date   INR 1.26 08/24/2011   INR 1.17 08/23/2011   INR 1.76* 08/12/2011    Examination:  General appearance: alert and cooperative Extremities: extremities normal, atraumatic, no cyanosis or edema and Homans sign is negative, no sign of DVT  Wound Exam: clean, dry, intact   Drainage:  None: wound tissue dry  Motor Exam EHL and FHL Intact  Sensory Exam Superficial Peroneal, Deep Peroneal and Tibial normal  Assessment:    1 Day Post-Op  Procedure(s) (LRB): TOTAL HIP ARTHROPLASTY (Right)  ADDITIONAL DIAGNOSIS:  Acute Blood Loss Anemia  Plan: Physical Therapy as ordered Partial Weight Bearing @ 50% (PWB)  DVT Prophylaxis:  Lovenox  DISCHARGE PLAN: Home  DISCHARGE NEEDS: HHPT, CPM, Walker and 3-in-1 comode seat         Everett Ricciardelli 08/24/2011, 8:29 AM

## 2011-08-24 NOTE — Progress Notes (Addendum)
Occupational Therapy Evaluation Patient Details Name: [name] [name] MRN: [id number] DOB: [birth year] Today's Date: 08/24/2011  Problem List:  Patient Active Problem List  Diagnoses  . MIXED HYPERLIPIDEMIA  . ATRIAL FIBRILLATION    Past Medical History:  Past Medical History  Diagnosis Date  . PONV (postoperative nausea and vomiting)   . Dysrhythmia     A Fib  . Hypertension   . COPD (chronic obstructive pulmonary disease)   . Bronchiectasis     L lung lobe removed  . Recurrent upper respiratory infection (URI)     finished Z pack recently   . Productive cough     chronic  . Diabetes mellitus   . Blood transfusion 1959    with lung lobectomy surgery  . GERD (gastroesophageal reflux disease)   . Arthritis   . Impaired vision     wears contacts or glasses  . Memory difficulties     pt having forgetfullness, taking Aricept preventatively, father had Alzheimers   Past Surgical History:  Past Surgical History  Procedure Date  . Cardiac catheterization 6/09  . Lung lobectomy 1959    Left  . Tonsillectomy   . Back surgery 2011  . Back surgery 2010  . Incise and drain abcess 2006    posterior neck  . Cardiovascular stress test 2010  . Kidney stone surgery     stone removed from ureter  . Other surgical history     ECHO at Dr Margaretmary Eddy office, Jonita Albee  . Other surgical history     ECHO at Dr Margaretmary Eddy office, Jonita Albee    OT Assessment/Plan/Recommendation OT Assessment Clinical Impression Statement: pt with decreased activity tolerance, decreased balance, poor recall of precautions OT Recommendation/Assessment: Patient will need skilled OT in the acute care venue OT Problem List: Decreased activity tolerance;Impaired balance (sitting and/or standing);Decreased knowledge of use of DME or AE;Decreased knowledge of precautions;Pain Barriers to Discharge: None OT Therapy Diagnosis : Generalized weakness;Acute pain OT Plan OT Frequency: Min 2X/week OT Treatment/Interventions:  Self-care/ADL training;DME and/or AE instruction;Energy conservation;Therapeutic activities;Patient/family education;Balance training OT Recommendation Follow Up Recommendations: Home health OT Equipment Recommended: Rolling walker with 5" wheels Individuals Consulted Consulted and Agree with Results and Recommendations: Family member/caregiver OT Goals Acute Rehab OT Goals OT Goal Formulation: With patient Time For Goal Achievement: Other (comment) (1 week) ADL Goals Pt Will Perform Lower Body Bathing: with min assist;Sitting in shower;Sit to stand in shower Pt Will Perform Lower Body Dressing: with min assist;Sit to stand from bed;Other (comment) (with AE PRN) Pt Will Transfer to Toilet: with min assist;3-in-1;Stand pivot transfer Pt Will Perform Toileting - Clothing Manipulation: with min assist;Other (comment) (sit to stand from 3:1) Pt Will Perform Toileting - Hygiene: with min assist;Sit to stand from 3-in-1/toilet  OT Evaluation Precautions/Restrictions  Precautions Precautions: Anterior Hip;Fall Precaution Booklet Issued: Yes (comment) Precaution Comments: Pt provided Anterior hip precautions Required Braces or Orthoses: No Restrictions Weight Bearing Restrictions: Yes RLE Weight Bearing: Partial weight bearing RLE Partial Weight Bearing Percentage or Pounds: 50% Prior Functioning Home Living Lives With: Spouse Receives Help From: Family Type of Home: House Home Layout: Two level;Able to live on main level with bedroom/bathroom Home Access: Stairs to enter;Ramped entrance Entrance Stairs-Rails: Can reach both Entrance Stairs-Number of Steps: 4 steps Bathroom Shower/Tub: Walk-in shower;Door [employer] Toilet: Handicapped height Bathroom Accessibility: Yes How Accessible: Accessible via walker Home Adaptive Equipment: Built-in shower seat;Grab bars around toilet Prior Function Level of Independence: Independent with basic ADLs;Independent with homemaking with  ambulation;Independent with homemaking with  wheelchair;Independent with transfers;Independent with gait Able to Take Stairs?: Yes Driving: Yes ADL ADL Lower Body Dressing: Performed;Other (comment);+1 Total assistance (pt required that the boxers threaded over bil LE) Lower Body Dressing Details (indicate cue type and reason): pt able to reach boxers at mid calf and pull up over knees Where Assessed - Lower Body Dressing: Sit to stand from bed (total A to pull boxers up in standing) Toilet Transfer: Simulated;+2 Total assistance;Comment for patient % (50%) Toilet Transfer Equipment: Other (comment) (Chair Level) Vision/Perception  Vision - History Baseline Vision: Wears glasses all the time Visual History: Cataracts (pt and wife report beginning phase of cataracts) Patient Visual Report: No change from baseline Vision - Assessment Eye Alignment: Within Functional Limits Cognition Cognition Orientation Level: Oriented X4 Sensation/Coordination Coordination Gross Motor Movements are Fluid and Coordinated: Yes Extremity Assessment RUE Assessment RUE Assessment: Within Functional Limits LUE Assessment LUE Assessment: Within Functional Limits Mobility  Bed Mobility Bed Mobility: Yes Rolling Right: 1: +2 Total assist;Patient percentage (comment);With rail Right Sidelying to Sit: 1: +2 Total assist;Patient percentage (comment);With rails;HOB elevated (comment degrees);Other (comment) Right Sidelying to Sit Details (indicate cue type and reason): pt required R LE support to come to EOB Sitting - Scoot to Edge of Bed: 4: Min assist Sitting - Scoot to Edge of Bed Details (indicate cue type and reason): pt uses bed rails to A Transfers Transfers: Yes Sit to Stand: With armrests;With upper extremity assist;From elevated surface;1: +2 Total assist;Patient percentage (comment) Sit to Stand Details (indicate cue type and reason): V/Cs for hand placement and PWB Exercises General Exercises -  Lower Extremity Ankle Circles/Pumps: 10 reps;Both;Seated (Seated in recliner) Quad Sets: Both;10 reps;Seated (Seated in recliner) End of Session OT - End of Session Equipment Utilized During Treatment: Gait belt;Other (comment) (rw) Activity Tolerance: Patient tolerated treatment well;Other (comment) (nausea with standing) Patient left: in chair;with call bell in reach;with family/visitor present;Other (comment) (wife present ) Nurse Communication: Weight bearing status;Mobility status for transfers;Mobility status for ambulation General Behavior During Session: [facility] for tasks performed Cognition: [facility] for tasks performed   Otis Peak OTS 08/24/2011, 10:04 AM   Lucile Shutters MS OTR/L

## 2011-08-24 NOTE — Discharge Summary (Signed)
Physician Discharge Summary  Patient ID: DEAGLAN LILE MRN: [id number] DOB/AGE: 02-13-1941 70 y.o.  Admit date: 08/23/2011 Discharge date: 08/24/2011  Admission Diagnoses: Right hip osteoarthritis  Discharge Diagnoses: Right hip ostearthritis   Active Problems:  * No active hospital problems. *    Discharged Condition: good  Hospital Course:   70 yo male admitted on 08/23/2011 .  After appropriate laboratory studies were obtained preoperatively as well as Vancomycin on call to the operating room he was taken to the OR where he underwent  A right THA.  He was taken to the recovery room in good condition. The patient was placed on IV pain medication as well PO pain medication.  POD# 1: Vital signs were stable.  Patient denied Chest pain, shortness of breath, or calf pain.  Patient was started on Lovenox bridge 40 mg subcutaneously  daily at 8am. Until Coumadin therapeutic.   Consults to PT, OT, and care management were made.  The patient was weight bearing as tolerated.   Incentive spirometry was taught.  POD#2: The patient continued to progress with physical therapy slowly.  Dressing was changed.  IV pain medications was discontinued and IV was saline locked.  The patient was continued on by mouth medications.    POD# 3: The patient passed all PT goals.   Consults: none  Significant Diagnostic Studies:   Treatments: IV hydration and antibiotics: vancomycin  Discharge Exam: Blood pressure 107/53, pulse 78, temperature 99.1 F (37.3 C), temperature source Oral, resp. rate 18, height 6\' 5"  (1.956 m), weight 129.275 kg (285 lb), SpO2 90.00%. General appearance: alert and cooperative Extremities: extremities normal, atraumatic, no cyanosis or edema and Homans sign is negative, no sign of DVT Pulses: 2+ and symmetric Skin: Skin color, texture, turgor normal. No rashes or lesions  Disposition:    Current Discharge Medication List    CONTINUE these medications which have NOT  CHANGED   Details  albuterol (PROVENTIL HFA;VENTOLIN HFA) 108 (90 BASE) MCG/ACT inhaler Inhale 2 puffs into the lungs every 6 (six) hours as needed. For shortness of breath     donepezil (ARICEPT) 10 MG tablet Take 10 mg by mouth at bedtime.      finasteride (PROSCAR) 5 MG tablet Take 5 mg by mouth daily.      flecainide (TAMBOCOR) 150 MG tablet Take 150 mg by mouth daily.      glipiZIDE (GLUCOTROL XL) 5 MG 24 hr tablet Take 5 mg by mouth daily.      HYDROcodone-acetaminophen (NORCO) 10-325 MG per tablet Take 1 tablet by mouth every 6 (six) hours as needed. For pain.    lansoprazole (PREVACID) 15 MG capsule Take 15 mg by mouth 2 (two) times daily.      lisinopril (PRINIVIL,ZESTRIL) 10 MG tablet Take 10 mg by mouth daily.      metFORMIN (GLUCOPHAGE) 500 MG tablet Take 250 mg by mouth daily.      OVER THE COUNTER MEDICATION Take 1 tablet by mouth 2 (two) times daily. Osteo bi flex     simvastatin (ZOCOR) 40 MG tablet Take 40 mg by mouth at bedtime.      !! warfarin (COUMADIN) 10 MG tablet Take 10 mg by mouth See admin instructions. Takes 10 mg by mouth on Tuesday, Thursday, Sunday    !! warfarin (COUMADIN) 5 MG tablet Take 5 mg by mouth See admin instructions. Takes 5 mg by mouth on Monday, Wednesday, Friday, and Saturday.     !! - Potential duplicate medications found. Please  discuss with provider.     Follow-up Information    Follow up with Raymon Mutton, MD. Call on 09/07/2011.   Contact information:   [address] [place] [address] [phone]          Signed: Altamese Cabal 08/24/2011, 7:12 PM

## 2011-08-24 NOTE — Progress Notes (Signed)
Patient referred to CSW for ?SNF. I spoke with patient and wife at bedside- the plan is for d/c home with [facility] services and DME (tbd) as identified and ordered. See CSW Psychosocial Assessment in shadow chart in [facility]. Reece Levy, MSW, Theresia Majors [phone]

## 2011-08-24 NOTE — Progress Notes (Signed)
Physical Therapy Evaluation Patient Details Name: [name] [name] MRN: [id number] DOB: 1941-02-23 Today's Date: 08/24/2011  Problem List:  Patient Active Problem List  Diagnoses  . MIXED HYPERLIPIDEMIA  . ATRIAL FIBRILLATION    Past Medical History:  Past Medical History  Diagnosis Date  . PONV (postoperative nausea and vomiting)   . Dysrhythmia     A Fib  . Hypertension   . COPD (chronic obstructive pulmonary disease)   . Bronchiectasis     L lung lobe removed  . Recurrent upper respiratory infection (URI)     finished Z pack recently   . Productive cough     chronic  . Diabetes mellitus   . Blood transfusion 1959    with lung lobectomy surgery  . GERD (gastroesophageal reflux disease)   . Arthritis   . Impaired vision     wears contacts or glasses  . Memory difficulties     pt having forgetfullness, taking Aricept preventatively, father had Alzheimers   Past Surgical History:  Past Surgical History  Procedure Date  . Cardiac catheterization 6/09  . Lung lobectomy 1959    Left  . Tonsillectomy   . Back surgery 2011  . Back surgery 2010  . Incise and drain abcess 2006    posterior neck  . Cardiovascular stress test 2010  . Kidney stone surgery     stone removed from ureter  . Other surgical history     ECHO at Dr Margaretmary Eddy office, Jonita Albee  . Other surgical history     ECHO at Dr Margaretmary Eddy office, Jonita Albee    PT Assessment/Plan/Recommendation PT Assessment Clinical Impression Statement: pt would benefit from skilled PT to decrease A needed at D/C and maximize I PT Recommendation/Assessment: Patient will need skilled PT in the acute care venue PT Problem List: Decreased strength;Decreased range of motion;Decreased activity tolerance;Decreased balance;Decreased mobility;Decreased knowledge of use of DME;Decreased knowledge of precautions Barriers to Discharge: None PT Therapy Diagnosis : Abnormality of gait;Acute pain PT Plan PT Frequency: 7X/week PT  Treatment/Interventions: DME instruction;Gait training;Stair training;Functional mobility training;Therapeutic exercise;Balance training;Patient/family education PT Recommendation Follow Up Recommendations: Home health PT Equipment Recommended: Rolling walker with 5" wheels PT Goals  Acute Rehab PT Goals PT Goal Formulation: With patient/family Time For Goal Achievement: 2 weeks Pt will Roll Supine to Left Side: Independently PT Goal: Rolling Supine to Left Side - Progress: Progressing toward goal Pt will go Supine/Side to Sit: with modified independence PT Goal: Supine/Side to Sit - Progress: Progressing toward goal Pt will Sit at Edge of Bed: Independently PT Goal: Sit at Edge Of Bed - Progress: Progressing toward goal Pt will Transfer Sit to Stand/Stand to Sit: with supervision;with upper extremity assist PT Transfer Goal: Sit to Stand/Stand to Sit - Progress: Progressing toward goal Pt will Ambulate: 51 - 150 feet;with supervision;with rolling walker PT Goal: Ambulate - Progress: Progressing toward goal Pt will Go Up / Down Stairs: 3-5 stairs;with min assist;with rail(s) PT Goal: Up/Down Stairs - Progress: Progressing toward goal Pt will Perform Home Exercise Program: Independently PT Goal: Perform Home Exercise Program - Progress: Progressing toward goal  PT Evaluation Precautions/Restrictions  Precautions Precautions: Anterior Hip;Fall Precaution Booklet Issued: Yes (comment) Precaution Comments: Pt provided Anterior hip precautions Required Braces or Orthoses: No Restrictions Weight Bearing Restrictions: Yes RLE Weight Bearing: Partial weight bearing RLE Partial Weight Bearing Percentage or Pounds: 50% Prior Functioning  Home Living Lives With: Spouse Receives Help From: Family Type of Home: House Home Layout: Two level;Able to live on main  level with bedroom/bathroom Home Access: Stairs to enter;Ramped entrance Entrance Stairs-Rails: Can reach both Entrance  Stairs-Number of Steps: 4 steps Bathroom Shower/Tub: Walk-in shower;Door [employer] Toilet: Handicapped height Bathroom Accessibility: Yes How Accessible: Accessible via walker Home Adaptive Equipment: Built-in shower seat;Grab bars around toilet Prior Function Level of Independence: Independent with basic ADLs;Independent with homemaking with ambulation;Independent with homemaking with wheelchair;Independent with transfers;Independent with gait Able to Take Stairs?: Yes Driving: Yes Cognition Cognition Orientation Level: Oriented X4 Sensation/Coordination Coordination Gross Motor Movements are Fluid and Coordinated: Yes Extremity Assessment RUE Assessment RUE Assessment: Within Functional Limits LUE Assessment LUE Assessment: Within Functional Limits RLE Assessment RLE Assessment: Exceptions to [facility] RLE Strength RLE Overall Strength: Due to pain (pt's surgical) RLE Overall Strength Comments: pt very painful with mobility LLE Assessment LLE Assessment: Within Functional Limits Mobility (including Balance) Bed Mobility Bed Mobility: Yes Rolling Right: 1: +2 Total assist;Patient percentage (comment);With rail Right Sidelying to Sit: 1: +2 Total assist;Patient percentage (comment);With rails;HOB elevated (comment degrees);Other (comment) Right Sidelying to Sit Details (indicate cue type and reason): pt required R LE support to come to EOB Sitting - Scoot to Edge of Bed: 4: Min assist Sitting - Scoot to Edge of Bed Details (indicate cue type and reason): pt uses bed rails to A Transfers Transfers: Yes Sit to Stand: With armrests;With upper extremity assist;From elevated surface;1: +2 Total assist;Patient percentage (comment) Sit to Stand Details (indicate cue type and reason): V/Cs for hand placement and PWB Ambulation/Gait Ambulation/Gait: Yes Ambulation/Gait Assistance: 1: +2 Total assist;Patient percentage (comment) (pt 80%) Ambulation/Gait Assistance Details (indicate cue type  and reason): Cues for sequencing Ambulation Distance (Feet): 5 Feet Assistive device: Rolling walker Gait Pattern: Step-to pattern;Decreased stance time - right Stairs: No Wheelchair Mobility Wheelchair Mobility: No  Balance Balance Assessed: No Exercise  General Exercises - Lower Extremity Ankle Circles/Pumps: 10 reps;Both;Seated (Seated in recliner) Quad Sets: Both;10 reps;Seated (Seated in recliner) End of Session PT - End of Session Equipment Utilized During Treatment: Gait belt Activity Tolerance: Patient limited by pain;Patient limited by fatigue Patient left: in chair;with call bell in reach;with family/visitor present General Behavior During Session: [facility] for tasks performed Cognition: [facility] for tasks performed  [facility] 08/24/2011, 9:54 AM

## 2011-08-25 LAB — GLUCOSE, CAPILLARY
Glucose-Capillary: 193 mg/dL — ABNORMAL HIGH (ref 70–99)
Glucose-Capillary: 114 mg/dL — ABNORMAL HIGH (ref 70–99)

## 2011-08-25 LAB — BASIC METABOLIC PANEL
BUN: 26 mg/dL — ABNORMAL HIGH (ref 6–23)
Chloride: 106 mEq/L (ref 96–112)
Creatinine, Ser: 0.97 mg/dL (ref 0.50–1.35)
GFR calc Af Amer: >90 mL/min (ref >90)
GFR calc non Af Amer: 82 mL/min — ABNORMAL LOW (ref >90)

## 2011-08-25 LAB — PROTIME-INR
INR: 1.21 (ref 0.00–1.49)
Prothrombin Time: 15.6 seconds — ABNORMAL HIGH (ref 11.6–15.2)

## 2011-08-25 LAB — CBC
HCT: 29.3 % — ABNORMAL LOW (ref 39.0–52.0)
MCHC: 33.8 g/dL (ref 30.0–36.0)
Platelets: 105 10*3/uL — ABNORMAL LOW (ref 150–400)
RDW: 13.9 % (ref 11.5–15.5)
WBC: 11.1 10*3/uL — ABNORMAL HIGH (ref 4.0–10.5)

## 2011-08-25 MED ORDER — INSULIN ASPART 100 UNIT/ML ~~LOC~~ SOLN
0.0000 [IU] | Freq: Three times a day (TID) | SUBCUTANEOUS | Status: DC
  Administered 2011-08-25 – 2011-08-26 (×2): 4 [IU] via SUBCUTANEOUS
  Filled 2011-08-25 (×2): qty 3

## 2011-08-25 MED ORDER — WARFARIN SODIUM 5 MG PO TABS
5.0000 mg | ORAL_TABLET | Freq: Once | ORAL | Status: AC
  Administered 2011-08-25: 5 mg via ORAL
  Filled 2011-08-25: qty 1

## 2011-08-25 NOTE — Progress Notes (Signed)
PATIENT ID:      [name] [name]  MRN:     [id number] DOB/AGE:    [birth year] / 70 y.o.    PROGRESS NOTE Subjective:  negative for Chest Pain  negative for Shortness of Breath  negative for Nausea/Vomiting   positive for Calf Pain  negative for Bowel Movement   Tolerating Diet: yes         Patient reports pain as 3 on 0-10 scale.    Objective: Vital signs in last 24 hours: Temp:  [97.6 F (36.4 C)-99.1 F (37.3 C)] 97.6 F (36.4 C) (11/07 0606) Pulse Rate:  [70-78] 70  (11/07 0606) Resp:  [18] 18  (11/07 0606) BP: (102-109)/(53-66) 102/64 mmHg (11/07 0606) SpO2:  [90 %-96 %] 96 % (11/07 0606)    Intake/Output from previous day: I/O last 3 completed shifts: In: 1380 [P.O.:480; I.V.:900] Out: 950 [Urine:950]   Intake/Output this shift:     LABORATORY DATA: @LABLAST (WBC:3,HGB:3,HCT:3,PLT:3) Sodium  Date/Time Value Range Status  08/24/2011  6:47 AM 137  135-145 (mEq/L) Final  08/12/2011 11:00 AM 142  135-145 (mEq/L) Final  04/02/2010  3:55 AM 138  135-145 (mEq/L) Final     Potassium  Date/Time Value Range Status  08/24/2011  6:47 AM 4.1  3.5-5.1 (mEq/L) Final  08/12/2011 11:00 AM 4.5  3.5-5.1 (mEq/L) Final  04/02/2010  3:55 AM 4.5  3.5-5.1 (mEq/L) Final     Chloride  Date/Time Value Range Status  08/24/2011  6:47 AM 102  96-112 (mEq/L) Final  08/12/2011 11:00 AM 105  96-112 (mEq/L) Final  04/02/2010  3:55 AM 104  96-112 (mEq/L) Final     CO2  Date/Time Value Range Status  08/24/2011  6:47 AM 25  19-32 (mEq/L) Final  08/12/2011 11:00 AM 26  19-32 (mEq/L) Final  04/02/2010  3:55 AM 27  19-32 (mEq/L) Final     BUN  Date/Time Value Range Status  08/24/2011  6:47 AM 18  6-23 (mg/dL) Final  40/98/1191 47:82 AM 15  6-23 (mg/dL) Final  9/56/2130  8:65 AM 18  6-23 (mg/dL) Final     Creatinine, Ser  Date/Time Value Range Status  08/24/2011  6:47 AM 0.81  0.50-1.35 (mg/dL) Final  78/46/9629 52:84 AM 0.73  0.50-1.35 (mg/dL) Final  1/32/4401  0:27 AM 1.18  0.4-1.5 (mg/dL)  Final     Glucose, Bld  Date/Time Value Range Status  08/24/2011  6:47 AM 160* 70-99 (mg/dL) Final  25/36/6440 34:74 AM 118* 70-99 (mg/dL) Final  2/59/5638  7:56 AM 140* 70-99 (mg/dL) Final     Calcium  Date/Time Value Range Status  08/24/2011  6:47 AM 8.8  8.4-10.5 (mg/dL) Final  43/32/9518 84:16 AM 10.2  8.4-10.5 (mg/dL) Final  03/24/3015  0:10 AM 8.3* 8.4-10.5 (mg/dL) Final   Lab Results  Component Value Date   INR 1.26 08/24/2011   INR 1.17 08/23/2011   INR 1.76* 08/12/2011    Examination:  General appearance: alert and cooperative Extremities: extremities normal, atraumatic, no cyanosis or edema and Homans sign is negative, no sign of DVT  Wound Exam: clean, dry, intact   Drainage:  None: wound tissue dry  Motor Exam EHL and FHL Intact  Sensory Exam Tibial normal  Assessment:    2 Days Post-Op  Procedure(s) (LRB): TOTAL HIP ARTHROPLASTY (Right)  ADDITIONAL DIAGNOSIS:  Acute Blood Loss Anemia  Plan: Physical Therapy as ordered Partial Weight Bearing @ 50% (PWB)  DVT Prophylaxis:  Lovenox and Coumadin  DISCHARGE PLAN: Home  DISCHARGE NEEDS: HHPT, HHRN, CPM, Walker  and 3-in-1 comode seat         [name] [name] 08/25/2011, 7:15 AM

## 2011-08-25 NOTE — Progress Notes (Signed)
Physical Therapy Treatment Patient Details Name: [name] [name] MRN: [id number] DOB: 08-06-1941 Today's Date: 08/25/2011  PT Assessment/Plan  PT - Assessment/Plan Comments on Treatment Session: Pt educated on 3/3 anterior hip precautions. He was able to verbalize 2/3 and required cueing for no abduction. Pt not progressing well enough to DC home today. Will notify RN.  PT Plan: Discharge plan remains appropriate PT Goals  Acute Rehab PT Goals PT Transfer Goal: Sit to Stand/Stand to Sit - Progress: Progressing toward goal PT Goal: Ambulate - Progress: Progressing toward goal PT Goal: Perform Home Exercise Program - Progress: Progressing toward goal  PT Treatment Precautions/Restrictions  Precautions Precautions: Anterior Hip;Fall Precaution Booklet Issued: No Precaution Comments: Pt provided Anterior hip precautions Required Braces or Orthoses: No Restrictions Weight Bearing Restrictions: Yes RLE Weight Bearing: Partial weight bearing RLE Partial Weight Bearing Percentage or Pounds: 50% Mobility (including Balance) Bed Mobility Bed Mobility: No Transfers Transfers: Yes Sit to Stand: 4: Min assist;From chair/3-in-1;With armrests Sit to Stand Details (indicate cue type and reason): VCs for safe technique and hand placement. Pt stood from 3n1 and recliner. Stand to Sit: 4: Min assist;To chair/3-in-1 (Recliner x 2) Stand to Sit Details: Cues for safe hand placement and to sit slowly Ambulation/Gait Ambulation/Gait: Yes Ambulation/Gait Assistance: 4: Min assist (+1 to follow with recliner) Ambulation/Gait Assistance Details (indicate cue type and reason): Cues and A for gait sequence and safety with RW. A to advance RLE Ambulation Distance (Feet): 30 Feet (Pt. required one seated rest break) Assistive device: Rolling walker Gait Pattern: Step-to pattern;Decreased step length - right;Decreased step length - left;Trunk flexed Gait velocity: Decreased gait speed. Stairs: No      Exercise  Total Joint Exercises Ankle Circles/Pumps: AROM;Right;10 reps;Seated Quad Sets: Seated;10 reps;Right;AROM;Strengthening Heel Slides: Strengthening;AAROM;Right;10 reps;Seated End of Session PT - End of Session Equipment Utilized During Treatment: Gait belt Activity Tolerance: Patient limited by fatigue;Patient limited by pain Patient left: in chair General Behavior During Session: [facility] for tasks performed Cognition: [facility] for tasks performed  Robinette, Adline Potter 08/25/2011, 9:40 AM

## 2011-08-25 NOTE — Progress Notes (Signed)
Physical Therapy Treatment Patient Details Name: MARKS SCALERA MRN: [id number] DOB: [birth year] Today's Date: 08/25/2011  PT Assessment/Plan  PT - Assessment/Plan Comments on Treatment Session: Pt. progressing very well this PM. Able to increase gait distance and quality. Will attempt stairs tomorrow morning. Altamese Cabal, [facility] in during treatment to inform patient that he will plan discharge for tomorrow.  PT Plan: Discharge plan remains appropriate PT Goals  Acute Rehab PT Goals PT Goal: Supine/Side to Sit - Progress: Progressing toward goal PT Transfer Goal: Sit to Stand/Stand to Sit - Progress: Progressing toward goal PT Goal: Ambulate - Progress: Progressing toward goal  PT Treatment Precautions/Restrictions  Precautions Precautions: Anterior Hip Precaution Booklet Issued: No Precaution Comments: Pt provided Anterior hip precautions Required Braces or Orthoses: No Restrictions Weight Bearing Restrictions: Yes RLE Weight Bearing: Partial weight bearing RLE Partial Weight Bearing Percentage or Pounds: 50% Mobility (including Balance) Bed Mobility Bed Mobility: Yes Rolling Right:  (pt =50 %) Supine to Sit: 1: +2 Total assist;Patient percentage (comment) (pt = 50%) Supine to Sit Details (indicate cue type and reason): A to lift shoulder up out of bed. A required to lower B LE up out of bed. Cues for safe positioning.  Sitting - Scoot to Edge of Bed: 5: Supervision Sitting - Scoot to [place] of Bed Details (indicate cue type and reason): Cues for technique Sit to Supine - Left: 4: Min assist Sit to Supine - Left Details (indicate cue type and reason): A for LEs. Cues for positioning and safe technique Transfers Transfers: Yes Sit to Stand: 4: Min assist;From bed;From chair/3-in-1 Sit to Stand Details (indicate cue type and reason): Cues for safe hand placement Stand to Sit: 4: Min assist;To chair/3-in-1;To bed Stand to Sit Details: Cues to sit  slowly Ambulation/Gait Ambulation/Gait: Yes Ambulation/Gait Assistance: Other (comment) (MinGuard A) Ambulation/Gait Assistance Details (indicate cue type and reason): Cues to increase step length, gait sequence and upright posture. Ambulation Distance (Feet): 180 Feet Assistive device: Rolling walker Gait Pattern: Step-to pattern;Decreased step length - right Gait velocity: Pt increasing gait cadence this PM Stairs: No (Will attempt tomorrow morning. )    Exercise    End of Session PT - End of Session Equipment Utilized During Treatment: Gait belt Activity Tolerance: Patient tolerated treatment well Patient left: in bed General Behavior During Session: [facility] for tasks performed Cognition: [facility] for tasks performed  Robinette, Adline Potter 08/25/2011, 2:12 PM

## 2011-08-25 NOTE — Progress Notes (Signed)
ANTICOAGULATION CONSULT NOTE - Follow Up Consult  Pharmacy Consult for Coumadin Indication: VTE prophylaxis, History of A-fib  Allergies  Allergen Reactions  . Penicillins   . Contrast Media (Iodinated Diagnostic Agents) Rash  . Quinine Derivatives Rash    quinidine    Patient Measurements: Height: 6\' 5"  (195.6 cm) Weight: 285 lb (129.275 kg) IBW/kg (Calculated) : 89.1    Vital Signs: Temp: 97.6 F (36.4 C) (11/07 0606) BP: 102/64 mmHg (11/07 0606) Pulse Rate: 70  (11/07 0606)  Labs:  Basename 08/25/11 0645 08/24/11 0647 08/23/11 2125  HGB 9.9* 10.8* --  HCT 29.3* 32.0* --  PLT 105* 112* --  APTT -- -- --  LABPROT 15.6* 16.1* 15.1  INR 1.21 1.26 1.17  HEPARINUNFRC -- -- --  CREATININE 0.97 0.81 --  CKTOTAL -- -- --  CKMB -- -- --  TROPONINI -- -- --   Estimated Creatinine Clearance: 106.9 ml/min (by C-G formula based on Cr of 0.97).   Medications:  Scheduled:    . celecoxib  200 mg Oral BID  . docusate sodium  100 mg Oral BID  . donepezil  10 mg Oral QHS  . enoxaparin  40 mg Subcutaneous Q24H  . finasteride  5 mg Oral Daily  . flecainide  150 mg Oral Daily  . glipiZIDE  5 mg Oral Q breakfast  . insulin aspart  0-20 Units Subcutaneous TID WC  . lisinopril  10 mg Oral Daily  . oxyCODONE  20 mg Oral Q12H  . pantoprazole  20 mg Oral Q1200  . patient's guide to using coumadin book   Does not apply Once  . simvastatin  40 mg Oral QHS  . warfarin  10 mg Oral ONCE-1800  . warfarin   Does not apply Once    Assessment: 2nd Day Post-op R-THA in patient with history of A-fib.   INR 1.21   H/H reflects acute surgical blood loss anemia.  Platelets (pre-op) 168, after 2 days of Lovenox 112 --> 105.    Goal of Therapy:  INR 2-3   Plan:  Continue Lovenox bridge, but will follow Platelets closely. Coumadin 5mg  today.  Verdene Creson, Elisha Headland, Pharm.D. 08/25/2011 1:08 PM

## 2011-08-26 LAB — BASIC METABOLIC PANEL
BUN: 18 mg/dL (ref 6–23)
Calcium: 8.8 mg/dL (ref 8.4–10.5)
Creatinine, Ser: 0.76 mg/dL (ref 0.50–1.35)
GFR calc Af Amer: >90 mL/min (ref >90)
GFR calc non Af Amer: >90 mL/min (ref >90)
Glucose, Bld: 163 mg/dL — ABNORMAL HIGH (ref 70–99)
Potassium: 3.8 mEq/L (ref 3.5–5.1)

## 2011-08-26 LAB — GLUCOSE, CAPILLARY
Glucose-Capillary: 189 mg/dL — ABNORMAL HIGH (ref 70–99)
Glucose-Capillary: 162 mg/dL — ABNORMAL HIGH (ref 70–99)

## 2011-08-26 LAB — CBC
HCT: 26.4 % — ABNORMAL LOW (ref 39.0–52.0)
Hemoglobin: 8.9 g/dL — ABNORMAL LOW (ref 13.0–17.0)
MCHC: 33.7 g/dL (ref 30.0–36.0)
MCV: 90.1 fL (ref 78.0–100.0)
RDW: 13.6 % (ref 11.5–15.5)

## 2011-08-26 MED ORDER — CELECOXIB 200 MG PO CAPS: 200.0000 mg | ORAL_CAPSULE | Freq: Two times a day (BID) | ORAL | Status: AC

## 2011-08-26 MED ORDER — ENOXAPARIN SODIUM 40 MG/0.4ML ~~LOC~~ SOLN: 40.0000 mg | SUBCUTANEOUS | Status: DC

## 2011-08-26 MED ORDER — OXYCODONE HCL 5 MG PO TABS: 5.0000 mg | ORAL_TABLET | ORAL | Status: AC | PRN

## 2011-08-26 MED ORDER — METHOCARBAMOL 500 MG PO TABS: 500.0000 mg | ORAL_TABLET | Freq: Four times a day (QID) | ORAL | Status: AC | PRN

## 2011-08-26 NOTE — Progress Notes (Signed)
Physical Therapy Treatment Patient Details Name: [name] [name] MRN: [id number] DOB: 10-Oct-1941 Today's Date: 08/26/2011  PT Assessment/Plan  PT - Assessment/Plan Comments on Treatment Session: Pt progressing very well today. Should be ready for DC after PM session of PT.  PT Plan: Discharge plan remains appropriate PT Goals  Acute Rehab PT Goals PT Transfer Goal: Sit to Stand/Stand to Sit - Progress: Met PT Goal: Ambulate - Progress: Met PT Goal: Up/Down Stairs - Progress: Progressing toward goal PT Goal: Perform Home Exercise Program - Progress: Progressing toward goal  PT Treatment Precautions/Restrictions  Precautions Precautions: Anterior Hip Precaution Booklet Issued: Yes (comment) Precaution Comments: Pt able to verbalize all precautions with min cues for no external rotation Required Braces or Orthoses: No Restrictions Weight Bearing Restrictions: Yes RLE Weight Bearing: Partial weight bearing RLE Partial Weight Bearing Percentage or Pounds: 50% Mobility (including Balance) Bed Mobility Bed Mobility: No Transfers Transfers: Yes Sit to Stand: 5: Supervision Stand to Sit: 5: Supervision Ambulation/Gait Ambulation/Gait: Yes Ambulation/Gait Assistance: 5: Supervision Ambulation/Gait Assistance Details (indicate cue type and reason): Cues for upright posture. No rest breaks required today with long ambulation.  Ambulation Distance (Feet): 240 Feet Assistive device: Rolling walker Gait Pattern: Step-to pattern;Trunk flexed Stairs: Yes Stairs Assistance: 4: Min assist Stairs Assistance Details (indicate cue type and reason): Cues for sequency and technique. A for balance. Pt leans onto UEs with rail and he stated he did this PTA.  Stair Management Technique: Two rails;Forwards Number of Stairs: 2  Wheelchair Mobility Wheelchair Mobility: No    Exercise  Total Joint Exercises Ankle Circles/Pumps: AROM;10 reps;Right;Seated Quad Sets: Seated;10  reps;Right;AROM;Strengthening Short Arc Quad: AAROM;Strengthening;Right;10 reps;Seated Heel Slides: Seated;10 reps;Right;Strengthening;AAROM End of Session PT - End of Session Equipment Utilized During Treatment: Gait belt Activity Tolerance: Patient tolerated treatment well Patient left: in chair Nurse Communication: Mobility status for ambulation General Behavior During Session: [facility] for tasks performed Cognition: [facility] for tasks performed  Robinette, Adline Potter 08/26/2011, 9:38 AM

## 2011-08-26 NOTE — Progress Notes (Signed)
Physical Therapy Treatment Patient Details Name: [name] [name] MRN: [id number] DOB: [birth year] Today's Date: 08/26/2011  PT Assessment/Plan  PT - Assessment/Plan Comments on Treatment Session: Pt continuing to progess well with therapy. Pt able to verbalize all precautions without cues. Pt stated he is eager to return home.  PT Plan: Discharge plan remains appropriate PT Goals  Acute Rehab PT Goals PT Goal: Supine/Side to Sit - Progress: Progressing toward goal PT Transfer Goal: Sit to Stand/Stand to Sit - Progress: Met PT Goal: Ambulate - Progress: Met PT Goal: Up/Down Stairs - Progress: Progressing toward goal PT Goal: Perform Home Exercise Program - Progress: Progressing toward goal  PT Treatment Precautions/Restrictions  Precautions Precautions: Anterior Hip Precaution Booklet Issued: No Precaution Comments: Pt able to verbalize all precautions with min cues for no external rotation Required Braces or Orthoses: No Restrictions Weight Bearing Restrictions: Yes RLE Weight Bearing: Partial weight bearing RLE Partial Weight Bearing Percentage or Pounds: 50% Mobility (including Balance) Bed Mobility Bed Mobility: Yes Supine to Sit: 4: Min assist Supine to Sit Details (indicate cue type and reason): A for LE. Better effiency today! Cues for safe technique Sitting - Scoot to Edge of Bed: 6: Modified independent (Device/Increase time) Transfers Transfers: Yes Sit to Stand: 5: Supervision;From bed Stand to Sit: 5: Supervision;To chair/3-in-1 Stand to Sit Details: Pt able to sit slower and safer this session Ambulation/Gait Ambulation/Gait: Yes Ambulation/Gait Assistance: 5: Supervision Ambulation/Gait Assistance Details (indicate cue type and reason): Cues for upright posture. No rest breaks required today with long ambulation.  Ambulation Distance (Feet): 225 Feet Assistive device: Rolling walker Gait Pattern: Step-to pattern;Trunk flexed Stairs: Yes Stairs Assistance:  Other (comment) (MinGuard A) Stairs Assistance Details (indicate cue type and reason): Cues for sequency. Relies heavily on rails Stair Management Technique: Step to pattern;Two rails;Forwards Number of Stairs: 2  Wheelchair Mobility Wheelchair Mobility: No    Exercise  Total Joint Exercises Ankle Circles/Pumps: AROM;10 reps;Right;Seated Quad Sets: Seated;10 reps;Right;AROM;Strengthening Short Arc Quad: AAROM;Strengthening;Right;10 reps;Seated Heel Slides: Seated;10 reps;Right;Strengthening;AAROM End of Session PT - End of Session Equipment Utilized During Treatment: Gait belt Activity Tolerance: Patient tolerated treatment well Patient left: in chair;with call bell in reach Nurse Communication: Mobility status for ambulation General Behavior During Session: [facility] for tasks performed Cognition: [facility] for tasks performed  Robinette, Adline Potter 08/26/2011, 1:22 PM

## 2011-08-26 NOTE — Progress Notes (Signed)
ANTICOAGULATION CONSULT NOTE - Follow Up Consult  Pharmacy Consult for Coumadin Indication: VTE prophylaxis, History of A-fib   Allergies  Allergen Reactions  . Penicillins   . Contrast Media (Iodinated Diagnostic Agents) Rash  . Quinine Derivatives Rash    quinidine    Patient Measurements: Height: 6\' 5"  (195.6 cm) Weight: 285 lb (129.275 kg) IBW/kg (Calculated) : 89.1    Vital Signs: Temp: 98 F (36.7 C) (11/08 0542) Temp src: Oral (11/08 0542) BP: 119/72 mmHg (11/08 0542) Pulse Rate: 69  (11/08 0542)  Labs:  Basename 08/26/11 1026 08/26/11 0620 08/25/11 0645 08/24/11 0647  HGB -- 8.9* 9.9* --  HCT -- 26.4* 29.3* 32.0*  PLT -- 103* 105* 112*  APTT -- -- -- --  LABPROT 20.3* -- 15.6* 16.1*  INR 1.70* -- 1.21 1.26  HEPARINUNFRC -- -- -- --  CREATININE -- 0.76 0.97 0.81  CKTOTAL -- -- -- --  CKMB -- -- -- --  TROPONINI -- -- -- --   Estimated Creatinine Clearance: 129.7 ml/min (by C-G formula based on Cr of 0.76).   Medications:  Scheduled:    . celecoxib  200 mg Oral BID  . docusate sodium  100 mg Oral BID  . donepezil  10 mg Oral QHS  . enoxaparin  40 mg Subcutaneous Q24H  . finasteride  5 mg Oral Daily  . flecainide  150 mg Oral Daily  . glipiZIDE  5 mg Oral Q breakfast  . insulin aspart  0-20 Units Subcutaneous TID WC  . lisinopril  10 mg Oral Daily  . oxyCODONE  20 mg Oral Q12H  . pantoprazole  20 mg Oral Q1200  . simvastatin  40 mg Oral QHS  . warfarin  5 mg Oral ONCE-1800  . warfarin   Does not apply Once    Assessment: 3rd Day Post-op R-THA in patient with history of A-fib.  H/H trending down slowly  8.9/26.4.  Platelets remain stable.  Goal of Therapy:  INR 2-3   Plan:  Patient is being discharged home. Resume home Coumadin schedule--Coumadin 10mg  TThSun, 5mg  MWFSat.   Jakory Matsuo, Elisha Headland, Pharm.D. 08/26/2011 1:12 PM

## 2011-08-26 NOTE — Progress Notes (Signed)
PATIENT ID:      [name] [name]  MRN:     [id number] DOB/AGE:    69-May-1942 / 69 y.o.    PROGRESS NOTE Subjective:  negative for Chest Pain  negative for Shortness of Breath  negative for Nausea/Vomiting   negative for Calf Pain  negative for Bowel Movement   Tolerating Diet: yes         Patient reports pain as 2 on 0-10 scale.    Objective: Vital signs in last 24 hours:  Temp:  [98 F (36.7 C)-99 F (37.2 C)] 98 F (36.7 C) (11/08 0542) Pulse Rate:  [69-81] 69  (11/08 0542) Resp:  [18-20] 20  (11/08 0542) BP: (107-124)/(62-72) 119/72 mmHg (11/08 0542) SpO2:  [94 %-97 %] 97 % (11/08 0542)    Intake/Output from previous day:  11/07 0701 - 11/08 0700 In: 1320 [P.O.:1320] Out: -    Intake/Output this shift:      LABORATORY DATA: WBC  Date/Time Value Range Status  08/26/2011  6:20 AM 8.3  4.0-10.5 (K/uL) Final  08/25/2011  6:45 AM 11.1* 4.0-10.5 (K/uL) Final  08/24/2011  6:47 AM 12.3* 4.0-10.5 (K/uL) Final     Hemoglobin  Date/Time Value Range Status  08/26/2011  6:20 AM 8.9* 13.0-17.0 (g/dL) Final  40/06/8118  1:47 AM 9.9* 13.0-17.0 (g/dL) Final  82/06/5620  3:08 AM 10.8* 13.0-17.0 (g/dL) Final     HCT  Date/Time Value Range Status  08/26/2011  6:20 AM 26.4* 39.0-52.0 (%) Final  08/25/2011  6:45 AM 29.3* 39.0-52.0 (%) Final  08/24/2011  6:47 AM 32.0* 39.0-52.0 (%) Final     Platelets  Date/Time Value Range Status  08/26/2011  6:20 AM 103* 150-400 (K/uL) Final     CONSISTENT WITH PREVIOUS RESULT  08/25/2011  6:45 AM 105* 150-400 (K/uL) Final     CONSISTENT WITH PREVIOUS RESULT  08/24/2011  6:47 AM 112* 150-400 (K/uL) Final     PLATELET COUNT CONFIRMED BY SMEAR   Sodium  Date/Time Value Range Status  08/26/2011  6:20 AM 139  135-145 (mEq/L) Final  08/25/2011  6:45 AM 141  135-145 (mEq/L) Final  08/24/2011  6:47 AM 137  135-145 (mEq/L) Final     Potassium  Date/Time Value Range Status  08/26/2011  6:20 AM 3.8  3.5-5.1 (mEq/L) Final  08/25/2011  6:45 AM 3.7  3.5-5.1  (mEq/L) Final  08/24/2011  6:47 AM 4.1  3.5-5.1 (mEq/L) Final     Chloride  Date/Time Value Range Status  08/26/2011  6:20 AM 105  96-112 (mEq/L) Final  08/25/2011  6:45 AM 106  96-112 (mEq/L) Final  08/24/2011  6:47 AM 102  96-112 (mEq/L) Final     CO2  Date/Time Value Range Status  08/26/2011  6:20 AM 23  19-32 (mEq/L) Final  08/25/2011  6:45 AM 24  19-32 (mEq/L) Final  08/24/2011  6:47 AM 25  19-32 (mEq/L) Final     BUN  Date/Time Value Range Status  08/26/2011  6:20 AM 18  6-23 (mg/dL) Final  65/04/8468  6:29 AM 26* 6-23 (mg/dL) Final  52/05/4131  4:40 AM 18  6-23 (mg/dL) Final     Creatinine, Ser  Date/Time Value Range Status  08/26/2011  6:20 AM 0.76  0.50-1.35 (mg/dL) Final  07/19/7252  6:64 AM 0.97  0.50-1.35 (mg/dL) Final  40/12/4740  5:95 AM 0.81  0.50-1.35 (mg/dL) Final     Glucose, Bld  Date/Time Value Range Status  08/26/2011  6:20 AM 163* 70-99 (mg/dL) Final  63/05/7563  3:32 AM 137*  70-99 (mg/dL) Final  40/06/8118  1:47 AM 160* 70-99 (mg/dL) Final     Calcium  Date/Time Value Range Status  08/26/2011  6:20 AM 8.8  8.4-10.5 (mg/dL) Final  82/06/5620  3:08 AM 8.9  8.4-10.5 (mg/dL) Final  65/04/8468  6:29 AM 8.8  8.4-10.5 (mg/dL) Final   Lab Results  Component Value Date   INR 1.70* 08/26/2011   INR 1.21 08/25/2011   INR 1.26 08/24/2011    Examination:  General appearance: alert and cooperative Extremities: extremities normal, atraumatic, no cyanosis or edema and Homans sign is negative, no sign of DVT  Wound Exam: clean, dry, intact   Drainage:  None: wound tissue dry  Motor Exam EHL and FHL Intact  Sensory Exam Tibial normal  Assessment:    3 Days Post-Op  Procedure(s) (LRB): TOTAL HIP ARTHROPLASTY (Right)  ADDITIONAL DIAGNOSIS:  Active Problems:  * No active hospital problems. *   Acute Blood Loss Anemia   Plan: Physical Therapy as ordered Partial Weight Bearing @ 50% (PWB)  DVT Prophylaxis:  Lovenox and Coumadin  DISCHARGE PLAN:  Home  DISCHARGE NEEDS: HHPT, HHRN, CPM, Walker and 3-in-1 comode seat         Marian Grandt 08/26/2011, 11:48 AM

## 2011-08-27 ENCOUNTER — Encounter (HOSPITAL_COMMUNITY): Payer: Self-pay | Admitting: Orthopedic Surgery

## 2011-08-27 LAB — CROSSMATCH
ABO/RH(D): A NEG
Unit division: 0

## 2011-12-07 ENCOUNTER — Telehealth: Payer: Self-pay | Admitting: Cardiology

## 2011-12-07 NOTE — Telephone Encounter (Signed)
06/19/11 & 09/15/11 & 12/07/11 reminder mailed to schedule f/u-srs °

## 2012-05-31 ENCOUNTER — Encounter: Payer: Self-pay | Admitting: Physician Assistant

## 2012-05-31 ENCOUNTER — Ambulatory Visit (INDEPENDENT_AMBULATORY_CARE_PROVIDER_SITE_OTHER): Payer: Medicare Other | Admitting: Physician Assistant

## 2012-05-31 VITALS — BP 120/66 | HR 101 | Ht 77.0 in | Wt 290.0 lb

## 2012-05-31 DIAGNOSIS — E119 Type 2 diabetes mellitus without complications: Secondary | ICD-10-CM | POA: Insufficient documentation

## 2012-05-31 DIAGNOSIS — Z902 Acquired absence of lung [part of]: Secondary | ICD-10-CM

## 2012-05-31 DIAGNOSIS — I10 Essential (primary) hypertension: Secondary | ICD-10-CM | POA: Insufficient documentation

## 2012-05-31 DIAGNOSIS — R0602 Shortness of breath: Secondary | ICD-10-CM

## 2012-05-31 DIAGNOSIS — I4891 Unspecified atrial fibrillation: Secondary | ICD-10-CM

## 2012-05-31 DIAGNOSIS — Z7901 Long term (current) use of anticoagulants: Secondary | ICD-10-CM

## 2012-05-31 DIAGNOSIS — Z9889 Other specified postprocedural states: Secondary | ICD-10-CM

## 2012-05-31 NOTE — Assessment & Plan Note (Signed)
Patient has a long history of paroxysmal atrial fibrillation but had been doing well for years maintained on flecainide and Coumadin. He stopped his morning dose of flecainide 6 months ago and now has been in atrial fibrillation for 10 days. He says he's continued to take his Coumadin and afternoon dose of flecainide. His INR was 2.5 at Dr. Alver Fisher office last week according to his wife. He has been therapeutic for a long time. I discussed this patient with Dr. Dietrich Pates who recommends scheduling him for cardioversion next week with Dr. Diona Browner. The patient prefers to have this done in the [place]. We will order blood work, repeat INR, obtain INR records from Dr. Clelia Croft, and do a 2-D echo prior to the cardioversion.

## 2012-05-31 NOTE — Progress Notes (Signed)
HPI:   This is a 71 year old male patient who has history of paroxysmal atrial fibrillation maintained on flecainide and Coumadin. He also had a normal cardiac catheterization in 2009 with normal coronary arteries and normal LV function done for dyspnea on exertion and abnormal adenosine stress test. He also has hypertension, history of diastolic heart failure,diabetes mellitus, and morbid obesity.  Patient comes in today because he says he has been in atrial fib for 10 days and feels terrible. He actually stopped his morning medications including his morning flecainide for 6 months because he didn't think he needed them. He did continue to take an afternoon dose of flecainide 150 mg. He saw Dr. Clelia Croft last week who recommended increasing his flecainide to twice a day and resumed his other medications. The patient gets out of breath with very little activity and he can feel his heart beating irregular. but says it doesn't necessarily go fast, it just beats are regular.He denies edema, chest pain, or weight gain.   Allergies:  -- Penicillins   -- Contrast Media (Iodinated Diagnostic Agents) -- Rash  -- Quinine Derivatives -- Rash   --  quinidine  Current Outpatient Prescriptions on File Prior to Visit: albuterol (PROVENTIL HFA;VENTOLIN HFA) 108 (90 BASE) MCG/ACT inhaler, Inhale 2 puffs into the lungs every 6 (six) hours as needed. For shortness of breath , Disp: , Rfl:  donepezil (ARICEPT) 10 MG tablet, Take 10 mg by mouth at bedtime.  , Disp: , Rfl:  finasteride (PROSCAR) 5 MG tablet, Take 5 mg by mouth daily.  , Disp: , Rfl:  flecainide (TAMBOCOR) 150 MG tablet, Take 150 mg by mouth 2 (two) times daily. , Disp: , Rfl:  glipiZIDE (GLUCOTROL XL) 5 MG 24 hr tablet, Take 5 mg by mouth daily.  , Disp: , Rfl:  HYDROcodone-acetaminophen (NORCO) 10-325 MG per tablet, Take 1 tablet by mouth every 6 (six) hours as needed. For pain., Disp: , Rfl:  lansoprazole (PREVACID) 15 MG capsule, Take 15 mg by mouth 2  (two) times daily.  , Disp: , Rfl:  lisinopril (PRINIVIL,ZESTRIL) 10 MG tablet, Take 10 mg by mouth daily.  , Disp: , Rfl:  metFORMIN (GLUCOPHAGE) 500 MG tablet, Take 250 mg by mouth 2 (two) times daily with a meal. , Disp: , Rfl:  simvastatin (ZOCOR) 40 MG tablet, Take 40 mg by mouth at bedtime.  , Disp: , Rfl:  warfarin (COUMADIN) 10 MG tablet, Take 10 mg by mouth See admin instructions. Takes 10 mg by mouth on Tuesday, Thursday, Sunday, Disp: , Rfl:  warfarin (COUMADIN) 5 MG tablet, Take 5 mg by mouth See admin instructions. Takes 5 mg by mouth on Monday, Wednesday, Friday, and Saturday., Disp: , Rfl:     Past Medical History:   PONV (postoperative nausea and vomiting)                     Dysrhythmia                                                    Comment:A Fib   Hypertension  COPD (chronic obstructive pulmonary disease)                 Bronchiectasis                                                 Comment:L lung lobe removed   Recurrent upper respiratory infection (URI)                    Comment:finished Z pack recently    Productive cough                                               Comment:chronic   Diabetes mellitus                                            Blood transfusion                               1959           Comment:with lung lobectomy surgery   GERD (gastroesophageal reflux disease)                       Arthritis                                                    Impaired vision                                                Comment:wears contacts or glasses   Memory difficulties                                            Comment:pt having forgetfullness, taking Aricept               preventatively, father had Alzheimers  Past Surgical History:   CARDIAC CATHETERIZATION                         6/09         LUNG LOBECTOMY                                  1959           Comment:Left   TONSILLECTOMY                                                 BACK SURGERY  2011         BACK SURGERY                                    2010         INCISE AND DRAIN ABCESS                         2006           Comment:posterior neck   CARDIOVASCULAR STRESS TEST                      2010         KIDNEY STONE SURGERY                                           Comment:stone removed from ureter   OTHER SURGICAL HISTORY                                         Comment:ECHO at Dr Margaretmary Eddy office, [facility]   OTHER SURGICAL HISTORY                                         Comment:ECHO at Dr Margaretmary Eddy office, [place]   TOTAL HIP ARTHROPLASTY                          08/23/2011      Comment:Procedure: TOTAL HIP ARTHROPLASTY;  Surgeon:               Raymon Mutton, MD;  Location: [facility] OR;                Service: Orthopedics;  Laterality: Right;  Review of patient's family history indicates:   Dementia                       Father                   Social History   Marital Status: Married             Spouse Name:                      Years of Education:                 Number of children:             Occupational History   None on file  Social History Main Topics   Smoking Status: Never Smoker                     Smokeless Status: Not on file                      Alcohol Use: No             Drug Use: No             Sexual Activity:  Other Topics            Concern   None on file  Social History Narrative   None on file    ROS:see history of present illness. Patient has chronic dyspnea on exertion   PHYSICAL EXAM: Well-nournished, in no acute distress. Neck: No JVD, HJR, Bruit, or thyroid enlargement  Lungs: decreased breath sounds throughout,No tachypnea, clear without wheezing, rales, or rhonchi  Cardiovascular: irregular irregular, distant heart sounds, no murmurs, gallops, bruit, thrill, or heave.  Abdomen: obese,BS normal. Soft without organomegaly, masses,  lesions or tenderness.  Extremities: without cyanosis, clubbing or edema. Good distal pulses bilateral  SKin: Warm, no lesions or rashes   Musculoskeletal: No deformities  Neuro: no focal signs  Filed Vitals:   05/31/12 1417  BP: 120/66  Pulse: 101      ZOX:WRUEAV fibrillation at 100 beats per minute, nonspecific interventricular block  Echocardiogram 02/04/2009: Echocardiogram  Conclusions:         1. The study quality is fair.           2. The study was technically limited due to the patient's inability to         lay in the left lateral decubitus position.           3. Mild concentric left ventricular hypertrophy is observed.         4. The estimated ejection fraction is 60-65%.           5. There is upper septal thickening.         6. The left atrium is moderately dilated.         7. The right ventricle is mildly dilated.           8. The right ventricular global systolic function is mildly reduced.         9. The right atrium is mildly dilated.           10. The aortic valve structure is normal.         11. The mitral valve leaflets appear normal.         12. There is mild mitral regurgitation.  (02/04/2009)

## 2012-05-31 NOTE — Assessment & Plan Note (Signed)
stable °

## 2012-05-31 NOTE — Patient Instructions (Addendum)
**Note De-Identified  Obfuscation** Your physician recommends that you continue on your current medications as directed. Please refer to the Current Medication list given to you today.  Your physician has recommended that you have a Cardioversion (DCCV). Electrical Cardioversion uses a jolt of electricity to your heart either through paddles or wired patches attached to your chest. This is a controlled, usually prescheduled, procedure. Defibrillation is done under light anesthesia in the hospital, and you usually go home the day of the procedure. This is done to get your heart back into a normal rhythm. You are not awake for the procedure. Please see the instruction sheet given to you today.  Your physician has requested that you have an echocardiogram. Echocardiography is a painless test that uses sound waves to create images of your heart. It provides your doctor with information about the size and shape of your heart and how well your heart's chambers and valves are working. This procedure takes approximately one hour. There are no restrictions for this procedure.  Your physician recommends that you schedule a follow-up appointment in: 1 month

## 2012-06-01 ENCOUNTER — Encounter: Payer: Self-pay | Admitting: *Deleted

## 2012-06-01 ENCOUNTER — Other Ambulatory Visit: Payer: Self-pay | Admitting: *Deleted

## 2012-06-01 ENCOUNTER — Telehealth: Payer: Self-pay | Admitting: *Deleted

## 2012-06-01 ENCOUNTER — Telehealth: Payer: Self-pay

## 2012-06-01 DIAGNOSIS — I4891 Unspecified atrial fibrillation: Secondary | ICD-10-CM

## 2012-06-01 NOTE — Telephone Encounter (Signed)
DCCV scheduled for 06/08/12 @10 :00am with Diona Browner  Checking percert

## 2012-06-01 NOTE — Telephone Encounter (Signed)
Message copied by Eustace Moore on Thu Jun 01, 2012 10:03 AM ------      Message from: Dyane Dustman      Created: Wed May 31, 2012  3:05 PM      Regarding: Cardioversion       [employer],            Would you please schedule this patient Cardioversion to be done @ [facility] per [place] [facility] per checkout on 05/31/12.  Patient will need BMET/INR prior to procedure.  Orders have been put in EPIC.            Follow-up disposition: Return in about 1 month (around 07/01/2012).       Diagnostic follow-up:         Instructions:         Check out comments: Cardioversion for AFIB/ schedule with Dr. Diona Browner in [place] (pt. will need BMET and INR before procedure)            ##Please call me with any questions##             Thanks,      Aurther Loft

## 2012-06-01 NOTE — Telephone Encounter (Signed)
Bedside cardioversion scheduled for 06/08/12 @10 :00 am with Dr. Diona Browner at [facility]. Arrive at [facility] at 8:00am. Patient informed and will come to office 06/02/12 to pick up instructions.

## 2012-06-01 NOTE — Telephone Encounter (Signed)
No precert required 

## 2012-06-07 ENCOUNTER — Other Ambulatory Visit: Payer: Self-pay

## 2012-06-07 ENCOUNTER — Other Ambulatory Visit (INDEPENDENT_AMBULATORY_CARE_PROVIDER_SITE_OTHER): Payer: Medicare Other

## 2012-06-07 DIAGNOSIS — I501 Left ventricular failure: Secondary | ICD-10-CM

## 2012-06-07 DIAGNOSIS — I4891 Unspecified atrial fibrillation: Secondary | ICD-10-CM

## 2012-06-07 DIAGNOSIS — R0602 Shortness of breath: Secondary | ICD-10-CM

## 2012-06-08 DIAGNOSIS — I4891 Unspecified atrial fibrillation: Secondary | ICD-10-CM

## 2012-06-08 LAB — PROTIME-INR

## 2012-06-22 ENCOUNTER — Encounter: Payer: Self-pay | Admitting: *Deleted

## 2012-06-22 ENCOUNTER — Encounter: Payer: Self-pay | Admitting: Cardiology

## 2012-06-22 ENCOUNTER — Ambulatory Visit (INDEPENDENT_AMBULATORY_CARE_PROVIDER_SITE_OTHER): Payer: Medicare Other | Admitting: Cardiology

## 2012-06-22 VITALS — BP 119/66 | HR 96 | Ht 77.0 in | Wt 292.0 lb

## 2012-06-22 DIAGNOSIS — E119 Type 2 diabetes mellitus without complications: Secondary | ICD-10-CM

## 2012-06-22 DIAGNOSIS — I10 Essential (primary) hypertension: Secondary | ICD-10-CM

## 2012-06-22 DIAGNOSIS — I4891 Unspecified atrial fibrillation: Secondary | ICD-10-CM

## 2012-06-22 DIAGNOSIS — R0602 Shortness of breath: Secondary | ICD-10-CM

## 2012-06-22 NOTE — Patient Instructions (Addendum)
Your physician recommends that you schedule a follow-up appointment after your visit with Dr. Johney Frame. Your physician recommends that you continue on your current medications as directed. Please refer to the Current Medication list given to you today.  Your physician has requested that you have a 2 day lexiscan myoview. For further information please visit [email or web address] Please follow instruction sheet, as given. You have been referred to Dr. Johney Frame.

## 2012-06-22 NOTE — Assessment & Plan Note (Signed)
Blood pressure well-controlled today. 

## 2012-06-22 NOTE — Assessment & Plan Note (Addendum)
Now recurrent following recent cardioversion. He has been on flecainide for several years, has generally done well with it, although now more persistent atrial fibrillation, QRS is widened, and he has LVH by echocardiogram. He does not have any underlying ischemic heart disease based on prior testing however. Plan is to followup with a two-day Myoview study to exclude any obvious ischemia, and then refer him for EP consultation regarding switch to a different antiarrhythmic versus possibly atrial fibrillation ablation. He will continue on Coumadin. Should probably have a sleep study evaluation at some point, if not already pursued by Dr. Sherryll Burger.

## 2012-06-22 NOTE — Assessment & Plan Note (Signed)
Followed by Dr. Shah. 

## 2012-06-22 NOTE — Progress Notes (Signed)
Clinical Summary Mr. Westling is a 71 y.o.male presenting for followup. He underwent  recent elective cardioversion of persistent atrial fibrillation. He was seen by Ms. Geni Bers in the [place] office with symptomatic atrial fibrillation back in August. It was determined that he had been taking his flecainide at 150 mg once daily, although had been on Coumadin with therapeutic INR levels, followed by Dr. Sherryll Burger. I had not seen him in the office since 2011.  Flecainide was increased to twice daily dosing at that point, and he ultimately was referred for an elective cardioversion at [facility] which resulted in successful restoration of sinus rhythm after 3 separate synchronized shocks. Patient states that within a few days he began to feel poorly, decreased energy, palpitations, also feeling of relative heaviness in his chest which had been his symptoms with atrial fibrillation before. He continued on the same medications, follows up today, and is noted to be in atrial fibrillation by ECG. His QRS is 146 ms with left anterior fascicular block. This is similar to previous tracing from August.  He has a history of normal coronary arteries by cardiac catheterization in 2009, although no recent ischemic followup testing. Recent echocardiogram from August is reviewed showing LVH with normal LV function.  I discussed with him plan for followup Myoview study to exclude any ischemic disease, and subsequent referral for EP consultation regarding switch to different antiarrhythmic agent versus potentially atrial fibrillation ablation.   Allergies  Allergen Reactions  . Penicillins   . Contrast Media (Iodinated Diagnostic Agents) Rash  . Quinine Derivatives Rash    quinidine    Current Outpatient Prescriptions  Medication Sig Dispense Refill  . albuterol (PROVENTIL HFA;VENTOLIN HFA) 108 (90 BASE) MCG/ACT inhaler Inhale 2 puffs into the lungs every 6 (six) hours as needed. For shortness of breath       .  donepezil (ARICEPT) 10 MG tablet Take 10 mg by mouth at bedtime.        . finasteride (PROSCAR) 5 MG tablet Take 5 mg by mouth daily.        . flecainide (TAMBOCOR) 150 MG tablet Take 150 mg by mouth 2 (two) times daily.       Marland Kitchen glipiZIDE (GLUCOTROL XL) 5 MG 24 hr tablet Take 5 mg by mouth daily.        Marland Kitchen HYDROcodone-acetaminophen (NORCO) 10-325 MG per tablet Take 1 tablet by mouth every 6 (six) hours as needed. For pain.      Marland Kitchen lansoprazole (PREVACID) 15 MG capsule Take 15 mg by mouth 2 (two) times daily.        Marland Kitchen lisinopril (PRINIVIL,ZESTRIL) 10 MG tablet Take 10 mg by mouth daily.        . metFORMIN (GLUCOPHAGE) 500 MG tablet Take 250 mg by mouth 2 (two) times daily with a meal.       . simvastatin (ZOCOR) 40 MG tablet Take 40 mg by mouth at bedtime.        Marland Kitchen warfarin (COUMADIN) 10 MG tablet Take 10 mg by mouth See admin instructions. Takes 10 mg by mouth on Tuesday, Thursday, Sunday      . warfarin (COUMADIN) 5 MG tablet Take 5 mg by mouth See admin instructions. Takes 5 mg by mouth on Monday, Wednesday, Friday, and Saturday.        Past Medical History  Diagnosis Date  . Atrial fibrillation   . Essential hypertension, benign   . COPD (chronic obstructive pulmonary disease)   . Bronchiectasis  L lung lobe removed  . Recurrent upper respiratory infection (URI)   . Type 2 diabetes mellitus   . GERD (gastroesophageal reflux disease)   . Arthritis   . Impaired vision     Wears contacts or glasses  . Memory difficulties     Forgetfullness, taking Aricept preventatively, father had Alzheimers  . Chronic back pain   . History of cardiac catheterization     Normal coronaries    Social History Mr. Dauphine reports that he has never smoked. He has never used smokeless tobacco. Mr. Stech reports that he does not drink alcohol.  Review of Systems No falls or syncope. No reported bleeding episodes on Coumadin. Stable appetite. No orthopnea or PND. Otherwise negative.  Physical  Examination Filed Vitals:   06/22/12 0825  BP: 119/66  Pulse: 96   Morbidly obese male no acute distress. HEENT: Conjunctiva and lids normal, oropharynx clear. Neck: Supple, increased girth, no obvious elevated JVP or carotid bruits, no thyromegaly. Lungs: Clear to auscultation, nonlabored breathing at rest. Cardiac: Irregularly irregular, no S3 or significant systolic murmur, no pericardial rub. Abdomen: Soft, nontender, obese, bowel sounds present, no guarding or rebound. Extremities: Trace edema, distal pulses 2+. Skin: Warm and dry. Musculoskeletal: No kyphosis. Neuropsychiatric: Alert and oriented x3, affect grossly appropriate.    Problem List and Plan   ATRIAL FIBRILLATION Now recurrent following recent cardioversion. He has been on flecainide for several years, has generally done well with it, although now more persistent atrial fibrillation, QRS is widened, and he has LVH by echocardiogram. He does not have any underlying ischemic heart disease based on prior testing however. Plan is to followup with a two-day Myoview study to exclude any obvious ischemia, and then refer him for EP consultation regarding switch to a different antiarrhythmic versus possibly atrial fibrillation ablation. He will continue on Coumadin. Should probably have a sleep study evaluation at some point, if not already pursued by Dr. Sherryll Burger.  Hypertension Blood pressure well controlled today.  Diabetes mellitus Followed by Dr. Sherryll Burger.    Jonelle Sidle, M.D., F.A.C.C.

## 2012-06-23 ENCOUNTER — Ambulatory Visit: Payer: Medicare Other | Admitting: Cardiology

## 2012-06-27 ENCOUNTER — Other Ambulatory Visit: Payer: Self-pay | Admitting: Cardiology

## 2012-06-27 ENCOUNTER — Telehealth: Payer: Self-pay

## 2012-06-27 DIAGNOSIS — I4891 Unspecified atrial fibrillation: Secondary | ICD-10-CM

## 2012-06-27 DIAGNOSIS — R0602 Shortness of breath: Secondary | ICD-10-CM

## 2012-06-27 NOTE — Telephone Encounter (Signed)
2 day lexiscan myoview. Weight 292 Diagnosis: 427.31, 786.05  Thursday & Friday Sept. 12th & 13, 2013 [facility]   Checking percert

## 2012-06-27 NOTE — Telephone Encounter (Signed)
Pt has Medicare and AARP MCR supplement.  No precert required.

## 2012-06-29 DIAGNOSIS — I4891 Unspecified atrial fibrillation: Secondary | ICD-10-CM

## 2012-07-03 ENCOUNTER — Ambulatory Visit: Payer: Medicare Other | Admitting: Cardiology

## 2012-07-05 ENCOUNTER — Ambulatory Visit (INDEPENDENT_AMBULATORY_CARE_PROVIDER_SITE_OTHER): Payer: Medicare Other | Admitting: Internal Medicine

## 2012-07-05 ENCOUNTER — Encounter: Payer: Self-pay | Admitting: Internal Medicine

## 2012-07-05 VITALS — BP 118/90 | HR 64 | Ht 77.0 in | Wt 296.0 lb

## 2012-07-05 DIAGNOSIS — I10 Essential (primary) hypertension: Secondary | ICD-10-CM

## 2012-07-05 DIAGNOSIS — I4891 Unspecified atrial fibrillation: Secondary | ICD-10-CM

## 2012-07-05 DIAGNOSIS — R0683 Snoring: Secondary | ICD-10-CM

## 2012-07-05 DIAGNOSIS — G4730 Sleep apnea, unspecified: Secondary | ICD-10-CM

## 2012-07-05 DIAGNOSIS — R0609 Other forms of dyspnea: Secondary | ICD-10-CM

## 2012-07-05 NOTE — Progress Notes (Signed)
Primary Care Physician: Kirstie Peri, MD Referring Physician:  Dr Durenda Hurt   [name] [name] is a 71 y.o. male with a h/o persistent atrial fibrillation who presents for EP consultation.  He reports initially being diagnosed with atrial fibrillation over 20 years ago.  He reports that episodes were initially infrequent but have subsequently increased in frequency and duration since that time.  Recently, he has developed persistent atrial fibrillation refractory to medical therapy with flecainide.  He reports symptoms of fatigue and decreased exercise tolerance during afib.  He does not feel palpitations.  He has been adequately anticoagulated with coumadin.  Recent echo reveals LA size 57mm.  He snores and has daytime fatigue.  His spouse notices occasional apnea.  He has not had a sleep study.  Today, he denies symptoms of palpitations, chest pain, orthopnea, PND, dizziness, presyncope, syncope, or neurologic sequela. The patient is tolerating medications without difficulties and is otherwise without complaint today.   Past Medical History  Diagnosis Date  . Persistent atrial fibrillation   . Essential hypertension, benign   . COPD (chronic obstructive pulmonary disease)   . Bronchiectasis     L lung lobe removed  . Recurrent upper respiratory infection (URI)   . Type 2 diabetes mellitus   . GERD (gastroesophageal reflux disease)   . Arthritis   . Impaired vision     Wears contacts or glasses  . Memory difficulties     Forgetfullness, taking Aricept preventatively, father had Alzheimers  . Chronic back pain   . History of cardiac catheterization     Normal coronaries  . Obesity   . Left atrial enlargement     LA size 57mm by echo   Past Surgical History  Procedure Date  . Cardiac catheterization 6/09  . Lung lobectomy 1959    Left  . Tonsillectomy   . Back surgery 2011  . Back surgery 2010  . Incise and drain abcess 2006    Posterior neck  . Kidney stone surgery     stone  removed from ureter  . Total hip arthroplasty 08/23/2011    Procedure: TOTAL HIP ARTHROPLASTY;  Surgeon: Raymon Mutton, MD;  Location: [facility] OR;  Service: Orthopedics;  Laterality: Right;    Current Outpatient Prescriptions  Medication Sig Dispense Refill  . albuterol (PROVENTIL HFA;VENTOLIN HFA) 108 (90 BASE) MCG/ACT inhaler Inhale 2 puffs into the lungs every 6 (six) hours as needed. For shortness of breath       . donepezil (ARICEPT) 10 MG tablet Take 10 mg by mouth at bedtime.        . finasteride (PROSCAR) 5 MG tablet Take 5 mg by mouth daily.        . flecainide (TAMBOCOR) 150 MG tablet Take 150 mg by mouth 2 (two) times daily.       Marland Kitchen glipiZIDE (GLUCOTROL XL) 5 MG 24 hr tablet Take 5 mg by mouth daily.        Marland Kitchen HYDROcodone-acetaminophen (NORCO) 10-325 MG per tablet Take 1 tablet by mouth every 6 (six) hours as needed. For pain.      Marland Kitchen lansoprazole (PREVACID) 15 MG capsule Take 15 mg by mouth 2 (two) times daily.        Marland Kitchen lisinopril (PRINIVIL,ZESTRIL) 10 MG tablet Take 10 mg by mouth daily.        Marland Kitchen loratadine (CLARITIN) 10 MG tablet Take 10 mg by mouth daily.      . metFORMIN (GLUCOPHAGE) 500 MG tablet Take 250 mg by  mouth 2 (two) times daily with a meal.       . simvastatin (ZOCOR) 40 MG tablet Take 40 mg by mouth at bedtime.        . solifenacin (VESICARE) 5 MG tablet Take 10 mg by mouth daily.      Marland Kitchen sulfamethoxazole-trimethoprim (BACTRIM DS) 800-160 MG per tablet 1 tablet 2 (two) times daily.       Marland Kitchen warfarin (COUMADIN) 5 MG tablet Take 5 mg by mouth See admin instructions. Takes 5 mg by mouth on Monday, Wednesday, Friday, and Saturday.      . warfarin (COUMADIN) 10 MG tablet Take 10 mg by mouth See admin instructions. Takes 10 mg by mouth on Tuesday, Thursday, Sunday        Allergies  Allergen Reactions  . Penicillins   . Contrast Media (Iodinated Diagnostic Agents) Rash  . Quinine Derivatives Rash    quinidine    History   Social History  . Marital Status: Married     Spouse Name: N/A    Number of Children: N/A  . Years of Education: N/A   Occupational History  . Not on file.   Social History Main Topics  . Smoking status: Never Smoker   . Smokeless tobacco: Never Used  . Alcohol Use: No  . Drug Use: No  . Sexually Active: Not on file   Other Topics Concern  . 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    Family History  Problem Relation Age of Onset  . Dementia Father     ROS- All systems are reviewed and negative except as per the HPI above.  He reports recently having bronchitis for which he has been treated with bactrim  Physical Exam: Filed Vitals:   07/05/12 1549  BP: 118/90  Pulse: 64  Height: 6\' 5"  (1.956 m)  Weight: 296 lb (134.265 kg)    GEN- The patient is obese appearing, alert and oriented x 3 today.   Head- normocephalic, atraumatic Eyes-  Sclera clear, conjunctiva pink Ears- hearing intact Oropharynx- clear Neck- supple, no JVP Lymph- no cervical lymphadenopathy Lungs- Clear to ausculation bilaterally, normal work of breathing Heart- irregular rate and rhythm, no murmurs, rubs or gallops, PMI not laterally displaced GI- soft, NT, ND, + BS Extremities- no clubbing, cyanosis, +1 edema MS- no significant deformity or atrophy Skin- no rash or lesion Psych- euthymic mood, full affect Neuro- strength and sensation are intact  EKG 06/22/12- afib, V rate 100 bpm, IVCD (QRS ), QTc 444 Echo 06/07/12 reviewed Myoview 06/22/12 reviewed  Assessment and Plan:

## 2012-07-05 NOTE — Patient Instructions (Signed)
Will admit for Tikosyn load--in 2 weeks(Dr Clelia Croft follows INR's)  Will need to have these done weekly from now until admission to hospital  You have been referred to Dr Andrey Campanile in [place] for a sleep study

## 2012-07-05 NOTE — Assessment & Plan Note (Signed)
The patient has persistent atrial fibrillation refractory to flecainide.  His left atrium is severely enlarged suggesting advanced atriopathy.  I suspect that our ability to maintain sinus rhythm long term may be limited.  Given LA size >54mm, I would not recommend ablation. Therapeutic strategies for afib including medicine and ablation were discussed in detail with the patient today. At this time, our medicine options are rate control or rhythm control with tikosyn/ amiodarone.  At this time, he would prefer tikosyn. I will therefore stop flecainide today.  He will be admitted in 2 weeks (once off of bactrim) to [facility] for initiation of tikosyn. We will need to confirm that his INRs have been therapeutic with Dr Margaretmary Eddy office in the interim.  He will need weekly INRs between now and admission. We will refer to our tikosyn clinic to make arrangements for admission.

## 2012-07-05 NOTE — Assessment & Plan Note (Signed)
Stable No change required today  

## 2012-07-05 NOTE — Assessment & Plan Note (Signed)
Given body habitus, snoring, and fatigue he is at increased risk for sleep apnea. If he has sleep apnea then we must treat this along with our strategy for afib management. I will refer to Dr Andrey Campanile so that he can have a sleep study in [place].

## 2012-07-06 ENCOUNTER — Telehealth: Payer: Self-pay | Admitting: *Deleted

## 2012-07-06 NOTE — Telephone Encounter (Signed)
I reviewed Dr. Jenel Lucks note Since patient switching to Tikosyn from Flecainide for management of AF, let's manage his recent Cardiolite results medically. Wife informed of the above information.

## 2012-07-06 NOTE — Telephone Encounter (Signed)
Patient informed. 

## 2012-07-06 NOTE — Telephone Encounter (Signed)
Message copied by Eustace Moore on Thu Jul 06, 2012  8:51 AM ------      Message from: MCDOWELL, Illene Bolus      Created: Wed Jul 05, 2012  4:09 PM       Reviewed. Possible area of inferoapical ischemia cannot be excluded. Need to see what Dr. Johney Frame plans after EP consultation, and we can decide if this needs further workup or medical therapy. Make sure he already has followup with me to discuss.

## 2012-07-13 ENCOUNTER — Telehealth: Payer: Self-pay | Admitting: Internal Medicine

## 2012-07-13 NOTE — Telephone Encounter (Signed)
plz return call to pt wife  [phone] regarding dental work

## 2012-07-13 NOTE — Telephone Encounter (Signed)
Will take Amoxicillin on 07/19/12 prior to dental procedure

## 2012-07-14 ENCOUNTER — Institutional Professional Consult (permissible substitution): Payer: Medicare Other | Admitting: Internal Medicine

## 2012-07-21 ENCOUNTER — Ambulatory Visit (INDEPENDENT_AMBULATORY_CARE_PROVIDER_SITE_OTHER): Payer: Medicare Other | Admitting: Pharmacist

## 2012-07-21 VITALS — Wt 290.0 lb

## 2012-07-21 DIAGNOSIS — I4891 Unspecified atrial fibrillation: Secondary | ICD-10-CM

## 2012-07-21 MED ORDER — RIVAROXABAN 20 MG PO TABS: 20.0000 mg | ORAL_TABLET | Freq: Every day | ORAL | Status: DC

## 2012-07-21 NOTE — Progress Notes (Signed)
HPI  [name] is a 71 yo pt of Dr. Jenel Lucks who was referred for Tikosyn initiation.  He has persistent atrial fibrillation and has been treated with flecainide.  This was stopped 9/18 since pt remained in afib despite therapy.  Dr. Johney Frame did not feel like he was a candidate for ablation so his medication options were Tikosyn or amiodarone.  Pt preferred Tikosyn.    Pt anticoagulated with Coumadin.  Reviewed most recent INRs from Dr. Sherryll Burger.  Pt had been therapeutic until 10/2 and his INR was 1.6.  They increased his dose slightly but pt has not taken the extra tablet yet (not due until Sunday).  INR today was 1.8.  He stopped flecainide on 9/18 and has not taken any other antiarrhythmic medications.  He was on Bactrim but this was stopped > 1 week ago.  He is currently not taking any other medications that may prolong QTc or interfere with Tikosyn.   EKG reviewed by Dr. [place] showed Afib with vent rate of 88 and QTc 435 msec.    Current Outpatient Prescriptions on File Prior to Visit  Medication Sig Dispense Refill  . albuterol (PROVENTIL HFA;VENTOLIN HFA) 108 (90 BASE) MCG/ACT inhaler Inhale 2 puffs into the lungs every 6 (six) hours as needed. For shortness of breath       . donepezil (ARICEPT) 10 MG tablet Take 10 mg by mouth at bedtime.        . finasteride (PROSCAR) 5 MG tablet Take 5 mg by mouth daily.        Marland Kitchen glipiZIDE (GLUCOTROL XL) 5 MG 24 hr tablet Take 5 mg by mouth daily.        Marland Kitchen HYDROcodone-acetaminophen (NORCO) 10-325 MG per tablet Take 1 tablet by mouth every 6 (six) hours as needed. For pain.      Marland Kitchen lansoprazole (PREVACID) 15 MG capsule Take 15 mg by mouth 2 (two) times daily.        Marland Kitchen lisinopril (PRINIVIL,ZESTRIL) 10 MG tablet Take 10 mg by mouth daily.        Marland Kitchen loratadine (CLARITIN) 10 MG tablet Take 10 mg by mouth daily.      . metFORMIN (GLUCOPHAGE) 500 MG tablet Take 250 mg by mouth 2 (two) times daily with a meal.       . Rivaroxaban (XARELTO) 20 MG TABS Take 1 tablet  (20 mg total) by mouth daily.  90 tablet  1  . simvastatin (ZOCOR) 40 MG tablet Take 40 mg by mouth at bedtime.        . solifenacin (VESICARE) 5 MG tablet Take 10 mg by mouth daily.

## 2012-07-21 NOTE — Patient Instructions (Addendum)
Stop Coumadin.  Start Xarelto 20mg  once daily.  We will follow up in 4 weeks to start Tikosyn

## 2012-07-21 NOTE — Assessment & Plan Note (Signed)
Discussed case with Dr. Johney Frame.  Pt's INR not therapeutic today and pt in atrial fibrillation.  Plan to hold off on starting Tikosyn until patient has been appropriately anticoagulated x 4 weeks.  Discussed changing to novel anticoagulation agent with patient to prevent fluctuation in INRs.  Pt and wife agreeable.  They agreed to start Xarelto 20mg  once daily.  Reviewed pt's renal function.  CrCl>138mL/min.  We have rescheduled his Tikosyn admission for 4 weeks from now.

## 2012-07-24 ENCOUNTER — Encounter: Payer: Self-pay | Admitting: Cardiology

## 2012-07-24 ENCOUNTER — Ambulatory Visit (INDEPENDENT_AMBULATORY_CARE_PROVIDER_SITE_OTHER): Payer: Medicare Other | Admitting: Cardiology

## 2012-07-24 VITALS — BP 121/70 | HR 67 | Ht 77.0 in | Wt 291.8 lb

## 2012-07-24 DIAGNOSIS — I10 Essential (primary) hypertension: Secondary | ICD-10-CM

## 2012-07-24 DIAGNOSIS — I4891 Unspecified atrial fibrillation: Secondary | ICD-10-CM

## 2012-07-24 NOTE — Progress Notes (Signed)
Clinical Summary [name] [name] is a 71 y.o.male presenting for followup. Since our last encounter, he was evaluated by Dr. Johney Frame with plan to discontinue Flecainide, and proceed with initiation of Tikosyn for control of atrial fibrillation. He was also switched from Coumadin to Xarelto, and the current plan is for initiation of Tikosyn after he has been therapeutic for 4 weeks.  Two-day Myoview study was reviewed in the interim demonstrating possible area of inferoapical ischemia in the setting of variable soft tissue attenuation, LVEF 53%. Have elected to follow this medically in the absence of obvious angina.  He reports no change in his typical symptoms. Has had no bleeding episodes on Xarelto.   Allergies  Allergen Reactions  . Penicillins   . Contrast Media (Iodinated Diagnostic Agents) Rash  . Quinine Derivatives Rash    quinidine    Current Outpatient Prescriptions  Medication Sig Dispense Refill  . albuterol (PROVENTIL HFA;VENTOLIN HFA) 108 (90 BASE) MCG/ACT inhaler Inhale 2 puffs into the lungs every 6 (six) hours as needed. For shortness of breath       . donepezil (ARICEPT) 10 MG tablet Take 10 mg by mouth at bedtime.        . finasteride (PROSCAR) 5 MG tablet Take 5 mg by mouth daily.        Marland Kitchen glipiZIDE (GLUCOTROL XL) 5 MG 24 hr tablet Take 5 mg by mouth daily.        Marland Kitchen HYDROcodone-acetaminophen (NORCO) 10-325 MG per tablet Take 1 tablet by mouth every 6 (six) hours as needed. For pain.      Marland Kitchen lansoprazole (PREVACID) 15 MG capsule Take 15 mg by mouth 2 (two) times daily.        Marland Kitchen lisinopril (PRINIVIL,ZESTRIL) 10 MG tablet Take 10 mg by mouth daily.        Marland Kitchen loratadine (CLARITIN) 10 MG tablet Take 10 mg by mouth daily.      . metFORMIN (GLUCOPHAGE) 500 MG tablet Take 250 mg by mouth 2 (two) times daily with a meal.       . Rivaroxaban (XARELTO) 20 MG TABS Take 1 tablet (20 mg total) by mouth daily.  90 tablet  1  . simvastatin (ZOCOR) 40 MG tablet Take 40 mg by mouth at  bedtime.        . solifenacin (VESICARE) 5 MG tablet Take 10 mg by mouth daily.        Past Medical History  Diagnosis Date  . Persistent atrial fibrillation   . Essential hypertension, benign   . COPD (chronic obstructive pulmonary disease)   . Bronchiectasis     L lung lobe removed  . Recurrent upper respiratory infection (URI)   . Type 2 diabetes mellitus   . GERD (gastroesophageal reflux disease)   . Arthritis   . Impaired vision     Wears contacts or glasses  . Memory difficulties     Forgetfullness, taking Aricept preventatively, father had Alzheimers  . Chronic back pain   . History of cardiac catheterization     Normal coronaries  . Obesity   . Left atrial enlargement     LA size 57mm by echo  . Sleep apnea 07/05/2012    Social History Mr. Moure reports that he has never smoked. He has never used smokeless tobacco. Mr. Claiborne reports that he does not drink alcohol.  Review of Systems Continues with exertional shortness of breath, sense of palpitations, some chest heaviness. No orthopnea or PND. No bleeding episodes.  Physical  Examination Filed Vitals:   07/24/12 1357  BP: 121/70  Pulse: 67   Filed Weights   07/24/12 1357  Weight: 291 lb 12.8 oz (132.36 kg)    HEENT: Conjunctiva and lids normal, oropharynx clear.  Neck: Supple, increased girth, no obvious elevated JVP or carotid bruits, no thyromegaly.  Lungs: Clear to auscultation, nonlabored breathing at rest.  Cardiac: Irregularly irregular, no S3 or significant systolic murmur, no pericardial rub.  Abdomen: Soft, nontender, obese, bowel sounds present, no guarding or rebound.  Extremities: Trace edema, distal pulses 2+.  Skin: Warm and dry.  Musculoskeletal: No kyphosis.  Neuropsychiatric: Alert and oriented x3, affect grossly appropriate.    Problem List and Plan   ATRIAL FIBRILLATION Persistent, not requiring any rate control medications. Anticoagulant now in the form of Xarelto. He will  continue with plan for initiation of Tikosyn  through the [facility] clinic, anticipate hospitalization in the next 3-4 weeks.  Hypertension Blood pressure is well controlled today.    Jonelle Sidle, M.D., F.A.C.C.

## 2012-07-24 NOTE — Patient Instructions (Addendum)
Your physician recommends that you schedule a follow-up appointment in: 6-8 weeks. Your physician recommends that you continue on your current medications as directed. Please refer to the Current Medication list given to you today.  

## 2012-07-24 NOTE — Assessment & Plan Note (Signed)
Blood pressure is well-controlled today. 

## 2012-07-24 NOTE — Assessment & Plan Note (Signed)
Persistent, not requiring any rate control medications. Anticoagulant now in the form of Xarelto. He will continue with plan for initiation of Tikosyn  through the [facility] clinic, anticipate hospitalization in the next 3-4 weeks.

## 2012-07-28 ENCOUNTER — Telehealth: Payer: Self-pay | Admitting: Internal Medicine

## 2012-07-28 NOTE — Telephone Encounter (Signed)
Tried to call back but put me in a voicemail activation box

## 2012-07-28 NOTE — Telephone Encounter (Signed)
plz return call to [facility] [phone]  [employer] approved xarelto medication that previously denied for the next 12 months

## 2012-08-16 ENCOUNTER — Encounter: Payer: Self-pay | Admitting: Internal Medicine

## 2012-08-18 HISTORY — PX: CARDIOVERSION: SHX1299

## 2012-08-21 ENCOUNTER — Ambulatory Visit (INDEPENDENT_AMBULATORY_CARE_PROVIDER_SITE_OTHER): Payer: Medicare Other | Admitting: Pharmacist

## 2012-08-21 VITALS — Wt 295.0 lb

## 2012-08-21 DIAGNOSIS — I4891 Unspecified atrial fibrillation: Secondary | ICD-10-CM

## 2012-08-21 LAB — BASIC METABOLIC PANEL
BUN: 17 mg/dL (ref 6–23)
Calcium: 9.4 mg/dL (ref 8.4–10.5)
Creatinine, Ser: 0.9 mg/dL (ref 0.4–1.5)

## 2012-08-21 LAB — MAGNESIUM: Magnesium: 1.5 mg/dL (ref 1.5–2.5)

## 2012-08-21 MED ORDER — MAGNESIUM OXIDE 400 MG PO TABS: 400.0000 mg | ORAL_TABLET | Freq: Two times a day (BID) | ORAL | Status: DC

## 2012-08-23 ENCOUNTER — Inpatient Hospital Stay (HOSPITAL_COMMUNITY)
Admission: AD | Admit: 2012-08-23 | Discharge: 2012-08-27 | DRG: 310 | Disposition: A | Discharge status: Home / Self Care | Payer: Medicare Other | Source: Ambulatory Visit | Attending: Internal Medicine | Admitting: Internal Medicine

## 2012-08-23 ENCOUNTER — Encounter (HOSPITAL_COMMUNITY): Payer: Self-pay | Admitting: General Practice

## 2012-08-23 ENCOUNTER — Ambulatory Visit (INDEPENDENT_AMBULATORY_CARE_PROVIDER_SITE_OTHER): Payer: Medicare Other | Admitting: *Deleted

## 2012-08-23 DIAGNOSIS — N289 Disorder of kidney and ureter, unspecified: Secondary | ICD-10-CM | POA: Diagnosis present

## 2012-08-23 DIAGNOSIS — G8929 Other chronic pain: Secondary | ICD-10-CM | POA: Diagnosis present

## 2012-08-23 DIAGNOSIS — Z880 Allergy status to penicillin: Secondary | ICD-10-CM

## 2012-08-23 DIAGNOSIS — M549 Dorsalgia, unspecified: Secondary | ICD-10-CM | POA: Diagnosis present

## 2012-08-23 DIAGNOSIS — Z888 Allergy status to other drugs, medicaments and biological substances status: Secondary | ICD-10-CM

## 2012-08-23 DIAGNOSIS — I10 Essential (primary) hypertension: Secondary | ICD-10-CM | POA: Diagnosis present

## 2012-08-23 DIAGNOSIS — I4891 Unspecified atrial fibrillation: Secondary | ICD-10-CM

## 2012-08-23 DIAGNOSIS — E782 Mixed hyperlipidemia: Secondary | ICD-10-CM | POA: Diagnosis present

## 2012-08-23 DIAGNOSIS — Z6833 Body mass index (BMI) 33.0-33.9, adult: Secondary | ICD-10-CM

## 2012-08-23 DIAGNOSIS — J479 Bronchiectasis, uncomplicated: Secondary | ICD-10-CM | POA: Diagnosis present

## 2012-08-23 DIAGNOSIS — J449 Chronic obstructive pulmonary disease, unspecified: Secondary | ICD-10-CM

## 2012-08-23 DIAGNOSIS — Z7901 Long term (current) use of anticoagulants: Secondary | ICD-10-CM

## 2012-08-23 DIAGNOSIS — R413 Other amnesia: Secondary | ICD-10-CM | POA: Diagnosis present

## 2012-08-23 DIAGNOSIS — Z79899 Other long term (current) drug therapy: Secondary | ICD-10-CM

## 2012-08-23 DIAGNOSIS — G4730 Sleep apnea, unspecified: Secondary | ICD-10-CM | POA: Diagnosis present

## 2012-08-23 DIAGNOSIS — Z91041 Radiographic dye allergy status: Secondary | ICD-10-CM

## 2012-08-23 DIAGNOSIS — E119 Type 2 diabetes mellitus without complications: Secondary | ICD-10-CM

## 2012-08-23 DIAGNOSIS — M129 Arthropathy, unspecified: Secondary | ICD-10-CM | POA: Diagnosis present

## 2012-08-23 DIAGNOSIS — K219 Gastro-esophageal reflux disease without esophagitis: Secondary | ICD-10-CM | POA: Diagnosis present

## 2012-08-23 HISTORY — DX: Pure hypercholesterolemia, unspecified: E78.00

## 2012-08-23 HISTORY — DX: Unspecified chronic bronchitis: J42

## 2012-08-23 HISTORY — DX: Obstructive sleep apnea (adult) (pediatric): G47.33

## 2012-08-23 HISTORY — DX: Personal history of other diseases of the musculoskeletal system and connective tissue: Z87.39

## 2012-08-23 LAB — BASIC METABOLIC PANEL
CO2: 28 mEq/L (ref 19–32)
Calcium: 9.2 mg/dL (ref 8.4–10.5)
Glucose, Bld: 153 mg/dL — ABNORMAL HIGH (ref 70–99)
Potassium: 4.1 mEq/L (ref 3.5–5.1)
Sodium: 139 mEq/L (ref 135–145)

## 2012-08-23 LAB — GLUCOSE, CAPILLARY: Glucose-Capillary: 127 mg/dL — ABNORMAL HIGH (ref 70–99)

## 2012-08-23 MED ORDER — LORATADINE 10 MG PO TABS
10.0000 mg | ORAL_TABLET | Freq: Every day | ORAL | Status: DC
  Administered 2012-08-23 – 2012-08-27 (×5): 10 mg via ORAL
  Filled 2012-08-23 (×5): qty 1

## 2012-08-23 MED ORDER — HYDROCODONE-ACETAMINOPHEN 10-325 MG PO TABS: 1.0000 | ORAL_TABLET | Freq: Four times a day (QID) | ORAL | Status: DC | PRN

## 2012-08-23 MED ORDER — DARIFENACIN HYDROBROMIDE ER 15 MG PO TB24
15.0000 mg | ORAL_TABLET | Freq: Every day | ORAL | Status: DC
  Administered 2012-08-23 – 2012-08-27 (×5): 15 mg via ORAL
  Filled 2012-08-23 (×5): qty 1

## 2012-08-23 MED ORDER — GLIPIZIDE ER 5 MG PO TB24
5.0000 mg | ORAL_TABLET | Freq: Every day | ORAL | Status: DC
  Administered 2012-08-23 – 2012-08-27 (×4): 5 mg via ORAL
  Filled 2012-08-23 (×5): qty 1

## 2012-08-23 MED ORDER — MAGNESIUM SULFATE 40 MG/ML IJ SOLN
4.0000 g | Freq: Once | INTRAMUSCULAR | Status: AC
  Administered 2012-08-23: 4 g via INTRAVENOUS
  Filled 2012-08-23: qty 100

## 2012-08-23 MED ORDER — LISINOPRIL 10 MG PO TABS
10.0000 mg | ORAL_TABLET | Freq: Every day | ORAL | Status: DC
  Administered 2012-08-23 – 2012-08-26 (×4): 10 mg via ORAL
  Filled 2012-08-23 (×5): qty 1

## 2012-08-23 MED ORDER — ALBUTEROL SULFATE HFA 108 (90 BASE) MCG/ACT IN AERS
2.0000 | INHALATION_SPRAY | Freq: Four times a day (QID) | RESPIRATORY_TRACT | Status: DC | PRN
  Filled 2012-08-23: qty 6.7

## 2012-08-23 MED ORDER — DOFETILIDE 500 MCG PO CAPS
500.0000 ug | ORAL_CAPSULE | Freq: Two times a day (BID) | ORAL | Status: DC
  Administered 2012-08-24 – 2012-08-27 (×7): 500 ug via ORAL
  Filled 2012-08-23 (×9): qty 1

## 2012-08-23 MED ORDER — SODIUM CHLORIDE 0.9 % IV SOLN: 250.0000 mL | INTRAVENOUS | Status: DC | PRN

## 2012-08-23 MED ORDER — RIVAROXABAN 20 MG PO TABS
20.0000 mg | ORAL_TABLET | Freq: Every day | ORAL | Status: DC
  Administered 2012-08-23 – 2012-08-26 (×4): 20 mg via ORAL
  Filled 2012-08-23 (×5): qty 1

## 2012-08-23 MED ORDER — PANTOPRAZOLE SODIUM 20 MG PO TBEC
20.0000 mg | DELAYED_RELEASE_TABLET | Freq: Every day | ORAL | Status: DC
  Administered 2012-08-24 – 2012-08-27 (×4): 20 mg via ORAL
  Filled 2012-08-23 (×5): qty 1

## 2012-08-23 MED ORDER — SODIUM CHLORIDE 0.9 % IJ SOLN
3.0000 mL | Freq: Two times a day (BID) | INTRAMUSCULAR | Status: DC
  Administered 2012-08-23 – 2012-08-27 (×8): 3 mL via INTRAVENOUS

## 2012-08-23 MED ORDER — METFORMIN HCL 500 MG PO TABS
250.0000 mg | ORAL_TABLET | Freq: Two times a day (BID) | ORAL | Status: DC
  Administered 2012-08-24 – 2012-08-27 (×7): 250 mg via ORAL
  Filled 2012-08-23 (×11): qty 1

## 2012-08-23 MED ORDER — DONEPEZIL HCL 10 MG PO TABS
10.0000 mg | ORAL_TABLET | Freq: Every day | ORAL | Status: DC
  Administered 2012-08-23 – 2012-08-26 (×4): 10 mg via ORAL
  Filled 2012-08-23 (×5): qty 1

## 2012-08-23 MED ORDER — SODIUM CHLORIDE 0.9 % IJ SOLN: 3.0000 mL | INTRAMUSCULAR | Status: DC | PRN

## 2012-08-23 MED ORDER — SIMVASTATIN 40 MG PO TABS
40.0000 mg | ORAL_TABLET | Freq: Every day | ORAL | Status: DC
  Administered 2012-08-23 – 2012-08-26 (×4): 40 mg via ORAL
  Filled 2012-08-23 (×5): qty 1

## 2012-08-23 MED ORDER — FINASTERIDE 5 MG PO TABS
5.0000 mg | ORAL_TABLET | Freq: Every day | ORAL | Status: DC
  Administered 2012-08-23 – 2012-08-26 (×4): 5 mg via ORAL
  Filled 2012-08-23 (×5): qty 1

## 2012-08-23 NOTE — H&P (Signed)
HPI:  [name] is a 71 yo pt of Dr. Jenel Lucks with morbid obesity, HTN, DM2, COPD and atrial fib being admitted for Tikosyn initiation.  He has persistent atrial fibrillation and has been treated with flecainide. This was stopped 9/18 since pt remained in afib despite therapy. Dr. Johney Frame did not feel like he was a candidate for ablation due to marked LA dilation. Pt was evaluated for Tikosyn initiation on 10/4 but INR was subtherapeutic. He was then started on Xarelto which he reports he has been taking reliably since that time.   He has had a cath previously which showed normal cors. He reports symptoms of fatigue and decreased exercise tolerance during afib. He does not feel palpitations. He reportedly snores and has had witnessed apneic episodes at night. No orthopnea, PND, syncope.   Review of Systems:     Cardiac Review of Systems: {Y] = yes [ ]  = no  Chest Pain [    ]  Resting SOB [   ] Exertional SOB  [ y ]  Orthopnea [  ]   Pedal Edema [   ]    Palpitations [  ] Syncope  [  ]   Presyncope [   ]  General Review of Systems: [Y] = yes [  ]=no Constitional: recent weight change [  ]; anorexia [  ]; fatigue [ y ]; nausea [  ]; night sweats [  ]; fever [  ]; or chills [  ];                                                                                                                                          Dental: poor dentition[  ];   Eye : blurred vision [  ]; diplopia [   ]; vision changes [  ];  Amaurosis fugax[  ]; Resp: cough [  ];  wheezing[  ];  hemoptysis[  ]; shortness of breath[  ]; paroxysmal nocturnal dyspnea[  ]; dyspnea on exertion[ y ]; or orthopnea[  ];  GI:  gallstones[  ], vomiting[  ];  dysphagia[  ]; melena[  ];  hematochezia [  ]; heartburn[  ];    GU: kidney stones [  ]; hematuria[  ];   dysuria [  ];  nocturia[  ];  history of     obstruction [  ];                 Skin: rash, swelling[  ];, hair loss[  ];  peripheral edema[  ];  or itching[   ]; Musculosketetal: myalgias[  ];  joint swelling[  ];  joint erythema[  ];  joint pain[ y ];  back pain[ y ];  Heme/Lymph: bruising[  ];  bleeding[  ];  anemia[  ];  Neuro: TIA[  ];  headaches[  ];  stroke[  ];  vertigo[  ];  seizures[  ];   paresthesias[  ];  difficulty walking[  ];  Psych:depression[  ]; anxiety[  ];  Endocrine: diabetes[  ];  thyroid dysfunction[  ];  Other:  Past Medical History  Diagnosis Date  . Persistent atrial fibrillation   . Essential hypertension, benign   . COPD (chronic obstructive pulmonary disease)   . Bronchiectasis     L lung lobe removed  . Recurrent upper respiratory infection (URI)   . Type 2 diabetes mellitus   . GERD (gastroesophageal reflux disease)   . Arthritis   . Impaired vision     Wears contacts or glasses  . Memory difficulties     Forgetfullness, taking Aricept preventatively, father had Alzheimers  . Chronic back pain   . History of cardiac catheterization     Normal coronaries  . Obesity   . Left atrial enlargement     LA size 57mm by echo  . Sleep apnea 07/05/2012    Medications Prior to Admission  Medication Sig Dispense Refill  . albuterol (PROVENTIL HFA;VENTOLIN HFA) 108 (90 BASE) MCG/ACT inhaler Inhale 2 puffs into the lungs every 6 (six) hours as needed. For shortness of breath       . donepezil (ARICEPT) 10 MG tablet Take 10 mg by mouth at bedtime.        . finasteride (PROSCAR) 5 MG tablet Take 5 mg by mouth at bedtime.       Marland Kitchen glipiZIDE (GLUCOTROL XL) 5 MG 24 hr tablet Take 5 mg by mouth daily.        Marland Kitchen HYDROcodone-acetaminophen (NORCO) 10-325 MG per tablet Take 1 tablet by mouth every 6 (six) hours as needed. For pain.      Marland Kitchen lansoprazole (PREVACID) 15 MG capsule Take 15 mg by mouth 2 (two) times daily.        Marland Kitchen lisinopril (PRINIVIL,ZESTRIL) 10 MG tablet Take 10 mg by mouth at bedtime.       Marland Kitchen loratadine (CLARITIN) 10 MG tablet Take 10 mg by mouth daily.      . metFORMIN (GLUCOPHAGE) 500 MG tablet Take 250 mg by  mouth 2 (two) times daily with a meal.       . Rivaroxaban (XARELTO) 20 MG TABS Take 20 mg by mouth daily at 8 pm.      . simvastatin (ZOCOR) 40 MG tablet Take 40 mg by mouth at bedtime.        . solifenacin (VESICARE) 10 MG tablet Take 10 mg by mouth at bedtime.         Allergies  Allergen Reactions  . Penicillins     Unknown  . Contrast Media (Iodinated Diagnostic Agents) Rash  . Quinine Derivatives Rash    quinidine    History   Social History  . Marital Status: Married    Spouse Name: N/A    Number of Children: N/A  . Years of Education: N/A   Occupational History  . Not on file.   Social History Main Topics  . Smoking status: Never Smoker   . Smokeless tobacco: Never Used  . Alcohol Use: No  . Drug Use: No  . Sexually Active: Not on file   Other Topics Concern  . 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    Family History  Problem Relation Age of Onset  . Dementia Father     PHYSICAL EXAM: Filed Vitals:   08/23/12 1734  BP: 137/87  Pulse:  78  Temp: 97.9 F (36.6 C)  Resp: 20   General:  Well appearing. No respiratory difficulty HEENT: normal Neck: supple. Thick. No obvious JVD. Carotids 2+ bilat; no bruits. No lymphadenopathy or thryomegaly appreciated. Cor: PTachy irregular. No rubs, gallops or murmurs. Lungs: clear Abdomen: soft, obese. nontender, nondistended. No hepatosplenomegaly. No bruits or masses. Good bowel sounds. Extremities: no cyanosis, clubbing, rash, edema Neuro: alert & oriented x 3, cranial nerves grossly intact. moves all 4 extremities w/o difficulty. Affect pleasant.  ECG: AF with RVR at 123 IVCD. LAFB No ST-T wave abnormalities. QTC   Results for orders placed during the hospital encounter of 08/23/12 (from the past 24 hour(s))  GLUCOSE, CAPILLARY     Status: Abnormal   Collection Time   08/23/12  5:31 PM      Component Value Range   Glucose-Capillary 127 (*) 70 - 99 mg/dL    No results found.   ASSESSMENT: 1. Symptomatic AF 2. DM2 3. Hypomagnesemia 4. OSA 5. HTN  PLAN/DISCUSSION:  Patient to be admitted for Tikosyn initiation due to symptomatic AF. Will continue Xarelto. Supp mag with 4g mag sulfate IV. If doesn't convert with Tikosyn will need DC-CV prior to d/c.   Truman Hayward 6:32 PM

## 2012-08-23 NOTE — Progress Notes (Signed)
HPI  [name] is a 71 yo pt of Dr. Jenel Lucks who was referred for Tikosyn initiation.  He has persistent atrial fibrillation and has been treated with flecainide.  This was stopped 9/18 since pt remained in afib despite therapy.  Dr. Johney Frame did not feel like he was a candidate for ablation so his medication options were Tikosyn or amiodarone.  Pt preferred Tikosyn.   Pt was evaluated for Tikosyn initiation on 10/4 but INR was subtherapeutic.  Pt was given the option of 4 weeks of therapeutic INRs versus changing to a NOAC agent for 4 weeks then starting Tikosyn.  After discussion, pt agreed to start Xarelto.  He states he has been compliant with this and has not had any side effects.    Reviewed pt's medication list.  Pt is currently not on any QTc prolongating medications or contraindicated medications.  Discussed possible side effects with pt and his wife including QTc prolongation.   EKG reviewed by Dr. Johney Frame showed Atrial fibrillation with vent. Rate of 99 bpm.  QTc- 435  Current Outpatient Prescriptions on File Prior to Visit  Medication Sig Dispense Refill  . albuterol (PROVENTIL HFA;VENTOLIN HFA) 108 (90 BASE) MCG/ACT inhaler Inhale 2 puffs into the lungs every 6 (six) hours as needed. For shortness of breath       . donepezil (ARICEPT) 10 MG tablet Take 10 mg by mouth at bedtime.        . finasteride (PROSCAR) 5 MG tablet Take 5 mg by mouth daily.        Marland Kitchen glipiZIDE (GLUCOTROL XL) 5 MG 24 hr tablet Take 5 mg by mouth daily.        Marland Kitchen HYDROcodone-acetaminophen (NORCO) 10-325 MG per tablet Take 1 tablet by mouth every 6 (six) hours as needed. For pain.      Marland Kitchen lansoprazole (PREVACID) 15 MG capsule Take 15 mg by mouth 2 (two) times daily.        Marland Kitchen lisinopril (PRINIVIL,ZESTRIL) 10 MG tablet Take 10 mg by mouth daily.        Marland Kitchen loratadine (CLARITIN) 10 MG tablet Take 10 mg by mouth daily.      . metFORMIN (GLUCOPHAGE) 500 MG tablet Take 250 mg by mouth 2 (two) times daily with a meal.       .  Rivaroxaban (XARELTO) 20 MG TABS Take 1 tablet (20 mg total) by mouth daily.  90 tablet  1  . simvastatin (ZOCOR) 40 MG tablet Take 40 mg by mouth at bedtime.        . solifenacin (VESICARE) 5 MG tablet Take 10 mg by mouth daily.

## 2012-08-23 NOTE — Assessment & Plan Note (Addendum)
Reviewed pt's labs.  K was acceptable but Mg was low at 1.5.  Discussed with Dr. Johney Frame.  Will supplement with Mag-Ox 400mg  BID x 2 days then recheck labs on 11/6.   11/6 Pt had repeat bloodwork and EKG done in [place] office.  Pt's Mag still 1.5 despite Mag-Ox supplement.  Pt's wife did confirm pt took all of the tablets.  Discussed with Dr. Johney Frame.  He gave the patient the option to be admitted today and have IV mag supplement or do PO mag at home for a few more days.  Pt preferred to be admitted today and have IV supplementation.  [facility].  Will need to make sure Mag is >1.8 prior to starting Tikosyn.

## 2012-08-24 DIAGNOSIS — I10 Essential (primary) hypertension: Secondary | ICD-10-CM

## 2012-08-24 DIAGNOSIS — E782 Mixed hyperlipidemia: Secondary | ICD-10-CM

## 2012-08-24 LAB — GLUCOSE, CAPILLARY
Glucose-Capillary: 148 mg/dL — ABNORMAL HIGH (ref 70–99)
Glucose-Capillary: 156 mg/dL — ABNORMAL HIGH (ref 70–99)

## 2012-08-24 LAB — BASIC METABOLIC PANEL
CO2: 28 mEq/L (ref 19–32)
Chloride: 103 mEq/L (ref 96–112)
Creatinine, Ser: 0.77 mg/dL (ref 0.50–1.35)
GFR calc Af Amer: >90 mL/min (ref >90)
Potassium: 3.9 mEq/L (ref 3.5–5.1)
Sodium: 141 mEq/L (ref 135–145)

## 2012-08-24 LAB — MAGNESIUM: Magnesium: 1.9 mg/dL (ref 1.5–2.5)

## 2012-08-24 NOTE — Care Management Note (Signed)
    Page 1 of 1   08/24/2012     3:02:45 PM   CARE MANAGEMENT NOTE 08/24/2012  Patient:  [name] [name], [name] [name]   Account Number:  [id number]  Date Initiated:  08/24/2012  Documentation initiated by:  Tera Mater  Subjective/Objective Assessment:   71yo male  admitted with AFIB. Pt. lives at home with spouse.     Action/Plan:   In to speak with pt. about Tikosyn medication. Pt. states that [facility] [place], [place].   Anticipated DC Date:  08/27/2012   Anticipated DC Plan:  HOME/SELF CARE      DC Planning Services  CM consult  Medication Assistance      Choice offered to / List presented to:             Status of service:  In process, will continue to follow Medicare Important Message given?   (If response is "NO", the following Medicare IM given date fields will be blank) Date Medicare IM given:   Date Additional Medicare IM given:    Discharge Disposition:    Per UR Regulation:  Reviewed for med. necessity/level of care/duration of stay  If discussed at Long Length of Stay Meetings, dates discussed:    Comments:  08/24/12 14[facility] [place], and they stated they would not have any trouble obtaining Tikosyn within 1-2 days. Physician, please write rx for 7day Tikosyn for Tikosyn fund from [employer] and refill rx as well.  Pt. will be inpt. for at least 2 more days.  NCM to follow for further dc needs.  Tera Mater, RN, BSN NCM [phone]

## 2012-08-24 NOTE — Progress Notes (Signed)
Subjective: No CP  NO SOB Objective: Filed Vitals:   08/23/12 1734 08/23/12 2100 08/24/12 0500 08/24/12 0954  BP: 137/87 147/88 132/89 127/66  Pulse: 78 82 92 86  Temp: 97.9 F (36.6 C) 98.6 F (37 C) 98 F (36.7 C) 97.4 F (36.3 C)  TempSrc: Oral Oral Oral Oral  Resp: 20 18 18 20   Height: 6\' 5"  (1.956 m)     Weight: 292 lb 9.6 oz (132.722 kg)  292 lb 12.8 oz (132.813 kg)   SpO2: 97% 97% 95% 97%   Weight change:   Intake/Output Summary (Last 24 hours) at 08/24/12 1036 Last data filed at 08/24/12 0900  Gross per 24 hour  Intake    703 ml  Output    750 ml  Net    -47 ml    General: Alert, awake, oriented x3, in no acute distress Neck:  JVP is normal Heart: Regular rate and rhythm, without murmurs, rubs, gallops.  Lungs: Clear to auscultation after cough  No rales or wheezes. Exemities:  No edema.   Neuro: Grossly intact, nonfocal.  Tele:  Afib  90s    Lab Results: Results for orders placed during the hospital encounter of 08/23/12 (from the past 24 hour(s))  GLUCOSE, CAPILLARY     Status: Abnormal   Collection Time   08/23/12  5:31 PM      Component Value Range   Glucose-Capillary 127 (*) 70 - 99 mg/dL  BASIC METABOLIC PANEL     Status: Abnormal   Collection Time   08/23/12  9:48 PM      Component Value Range   Sodium 139  135 - 145 mEq/L   Potassium 4.1  3.5 - 5.1 mEq/L   Chloride 102  96 - 112 mEq/L   CO2 28  19 - 32 mEq/L   Glucose, Bld 153 (*) 70 - 99 mg/dL   BUN 16  6 - 23 mg/dL   Creatinine, Ser 2.95  0.50 - 1.35 mg/dL   Calcium 9.2  8.4 - 62.1 mg/dL   GFR calc non Af Amer 84 (*) >90 mL/min   GFR calc Af Amer >90  >90 mL/min  MAGNESIUM     Status: Normal   Collection Time   08/23/12  9:48 PM      Component Value Range   Magnesium 2.1  1.5 - 2.5 mg/dL  MAGNESIUM     Status: Normal   Collection Time   08/24/12  5:00 AM      Component Value Range   Magnesium 1.9  1.5 - 2.5 mg/dL  BASIC METABOLIC PANEL     Status: Abnormal   Collection Time   08/24/12   5:00 AM      Component Value Range   Sodium 141  135 - 145 mEq/L   Potassium 3.9  3.5 - 5.1 mEq/L   Chloride 103  96 - 112 mEq/L   CO2 28  19 - 32 mEq/L   Glucose, Bld 149 (*) 70 - 99 mg/dL   BUN 15  6 - 23 mg/dL   Creatinine, Ser 3.08  0.50 - 1.35 mg/dL   Calcium 9.3  8.4 - 65.7 mg/dL   GFR calc non Af Amer 90 (*) >90 mL/min   GFR calc Af Amer >90  >90 mL/min  GLUCOSE, CAPILLARY     Status: Abnormal   Collection Time   08/24/12  6:16 AM      Component Value Range   Glucose-Capillary 148 (*) 70 - 99  mg/dL   Comment 1 Notify RN      Studies/Results: @RISRSLT24 @  Medications: Reviewed   Patient Active Hospital Problem List: Atrial fibrillation (07/30/2009)   Assessment: Remains in Afib.  Because of low Mg level did not got first dose of Tikosyn until this AM Continue load.  Follow EKGs  May need cardioversion, possible Saturday if does not convert  Hypertension (05/31/2012)   Assessment:  Controlled    LOS: 1 day   [name] [name] 08/24/2012, 10:36 AM

## 2012-08-25 LAB — BASIC METABOLIC PANEL
BUN: 18 mg/dL (ref 6–23)
CO2: 28 mEq/L (ref 19–32)
Calcium: 9.4 mg/dL (ref 8.4–10.5)
Chloride: 103 mEq/L (ref 96–112)
Creatinine, Ser: 0.86 mg/dL (ref 0.50–1.35)
GFR calc Af Amer: >90 mL/min (ref >90)

## 2012-08-25 LAB — MAGNESIUM: Magnesium: 1.7 mg/dL (ref 1.5–2.5)

## 2012-08-25 MED ORDER — MAGNESIUM SULFATE 40 MG/ML IJ SOLN
2.0000 g | Freq: Once | INTRAMUSCULAR | Status: AC
  Administered 2012-08-25: 2 g via INTRAVENOUS
  Filled 2012-08-25: qty 50

## 2012-08-25 NOTE — Progress Notes (Signed)
   SUBJECTIVE: The patient is doing well today.  At this time, he denies chest pain, shortness of breath, or any new concerns.  He remains in afib.  QT is stable on tikosyn.     . darifenacin  15 mg Oral Daily  . dofetilide  500 mcg Oral Q12H  . donepezil  10 mg Oral QHS  . finasteride  5 mg Oral QHS  . glipiZIDE  5 mg Oral QAC breakfast  . lisinopril  10 mg Oral QHS  . loratadine  10 mg Oral Daily  . [COMPLETED] magnesium sulfate 1 - 4 g bolus IVPB  2 g Intravenous Once  . magnesium sulfate 1 - 4 g bolus IVPB  2 g Intravenous Once  . metFORMIN  250 mg Oral BID WC  . pantoprazole  20 mg Oral QAC breakfast  . Rivaroxaban  20 mg Oral Q supper  . simvastatin  40 mg Oral QHS  . sodium chloride  3 mL Intravenous Q12H      OBJECTIVE: Physical Exam: Filed Vitals:   08/25/12 0204 08/25/12 0451 08/25/12 1100 08/25/12 1444  BP: 127/88 154/88 125/80 120/76  Pulse: 91 91 82 90  Temp:  98.2 F (36.8 C) 97.6 F (36.4 C) 98.6 F (37 C)  TempSrc:  Oral Oral Oral  Resp: 18 18 18 18   Height:      Weight:  291 lb 4.8 oz (132.133 kg)    SpO2: 97% 99% 96% 97%    Intake/Output Summary (Last 24 hours) at 08/25/12 1734 Last data filed at 08/25/12 1500  Gross per 24 hour  Intake    846 ml  Output   2135 ml  Net  -1289 ml    Telemetry reveals afib, no ventricular arrhythmais  GEN- The patient is well appearing, alert and oriented x 3 today.   Head- normocephalic, atraumatic Eyes-  Sclera clear, conjunctiva pink Ears- hearing intact Oropharynx- clear Neck- supple  Lungs- Clear to ausculation bilaterally, normal work of breathing Heart- irregular rate and rhythm, no murmurs, rubs or gallops, PMI not laterally displaced GI- soft, NT, ND, + BS Extremities- no clubbing, cyanosis, or edema   LABS: Basic Metabolic Panel:  Basename 08/25/12 0520 08/24/12 0500  NA 142 141  K 4.1 3.9  CL 103 103  CO2 28 28  GLUCOSE 138* 149*  BUN 18 15  CREATININE 0.86 0.77  CALCIUM 9.4 9.3  MG  1.7 1.9  PHOS -- --  Anemia Panel: No results found for this basename: VITAMINB12,FOLATE,FERRITIN,TIBC,IRON,RETICCTPCT in the last 72 hours  RADIOLOGY: No results found.  ASSESSMENT AND PLAN:  Principal Problem:  *Atrial fibrillation Active Problems:  Mixed hyperlipidemia  Hypertension  Diabetes mellitus 1. afib Tolerating tikosyn, with stable QT by ekg (reviewed) Will replete Mg If he does not convert to sinus overnight, he would require cardioversion in the am. Risks, benefits, and alternatives to cardioversion were discussed at length with the patient who wishes to proceed. I will ask anesthesia to assist with sedation for am cardioversion Npo except meds after midnight  2 HTN Stable No changes  3. DM Stable  No changes  Hillis Range, MD 08/25/2012 5:34 PM

## 2012-08-25 NOTE — Progress Notes (Signed)
Utilization review completed.  

## 2012-08-26 ENCOUNTER — Encounter (HOSPITAL_COMMUNITY)
Admission: AD | Disposition: A | Discharge status: Home / Self Care | Payer: Self-pay | Source: Ambulatory Visit | Attending: Internal Medicine

## 2012-08-26 ENCOUNTER — Encounter (HOSPITAL_COMMUNITY): Payer: Self-pay | Admitting: Certified Registered Nurse Anesthetist

## 2012-08-26 ENCOUNTER — Inpatient Hospital Stay (HOSPITAL_COMMUNITY): Payer: Medicare Other | Admitting: Certified Registered Nurse Anesthetist

## 2012-08-26 HISTORY — PX: CARDIOVERSION: SHX1299

## 2012-08-26 LAB — CBC
HCT: 44.5 % (ref 39.0–52.0)
Hemoglobin: 15.2 g/dL (ref 13.0–17.0)
MCH: 31 pg (ref 26.0–34.0)
MCHC: 34.2 g/dL (ref 30.0–36.0)
RDW: 14.1 % (ref 11.5–15.5)

## 2012-08-26 LAB — BASIC METABOLIC PANEL
BUN: 16 mg/dL (ref 6–23)
Chloride: 101 mEq/L (ref 96–112)
Creatinine, Ser: 0.75 mg/dL (ref 0.50–1.35)
Glucose, Bld: 128 mg/dL — ABNORMAL HIGH (ref 70–99)
Potassium: 4.1 mEq/L (ref 3.5–5.1)

## 2012-08-26 SURGERY — CARDIOVERSION
Anesthesia: General | Wound class: Clean

## 2012-08-26 MED ORDER — SODIUM CHLORIDE 0.9 % IJ SOLN: 3.0000 mL | INTRAMUSCULAR | Status: DC | PRN

## 2012-08-26 MED ORDER — SALINE SPRAY 0.65 % NA SOLN
1.0000 | NASAL | Status: DC | PRN
  Filled 2012-08-26: qty 44

## 2012-08-26 MED ORDER — SODIUM CHLORIDE 0.9 % IJ SOLN
3.0000 mL | Freq: Two times a day (BID) | INTRAMUSCULAR | Status: DC
  Administered 2012-08-26: 3 mL via INTRAVENOUS

## 2012-08-26 MED ORDER — SODIUM CHLORIDE 0.9 % IV SOLN: 250.0000 mL | INTRAVENOUS | Status: DC

## 2012-08-26 MED ORDER — PROPOFOL 10 MG/ML IV BOLUS
INTRAVENOUS | Status: DC | PRN
  Administered 2012-08-26: 100 ug via INTRAVENOUS

## 2012-08-26 NOTE — Anesthesia Preprocedure Evaluation (Addendum)
Anesthesia Evaluation  Patient identified by MRN, date of birth, ID band Patient awake    Reviewed: Allergy & Precautions, H&P , NPO status , Patient's Chart, lab work & pertinent test results, reviewed documented beta blocker date and time   History of Anesthesia Complications Negative for: history of anesthetic complications  Airway Mallampati: II      Dental   Pulmonary shortness of breath and with exertion, sleep apnea , COPD COPD inhaler, Recent URI ,  Hx L lower lobectomy for bronchiectasis at age 4y.         Cardiovascular hypertension, Pt. on medications + dysrhythmias Atrial Fibrillation     Neuro/Psych negative neurological ROS     GI/Hepatic GERD-  Medicated,  Endo/Other  diabetes, Oral Hypoglycemic Agents  Renal/GU Renal disease     Musculoskeletal   Abdominal   Peds  Hematology   Anesthesia Other Findings   Reproductive/Obstetrics                          Anesthesia Physical Anesthesia Plan  ASA: III  Anesthesia Plan: General   Post-op Pain Management:    Induction: Intravenous  Airway Management Planned: Mask  Additional Equipment:   Intra-op Plan:   Post-operative Plan:   Informed Consent: I have reviewed the patients History and Physical, chart, labs and discussed the procedure including the risks, benefits and alternatives for the proposed anesthesia with the patient or authorized representative who has indicated his/her understanding and acceptance.     Plan Discussed with:   Anesthesia Plan Comments:         Anesthesia Quick Evaluation

## 2012-08-26 NOTE — Procedures (Signed)
EP procedure Note   Pre procedure Diagnosis:  Persistent Atrial fibrillation Post procedure Diagnosis:  Same  Procedures:  Electrical cardioversion  Description:  Informed, written consent was obtained for cardioversion.  Adequate IV acces and airway support were assured.  The patient was adequately sedated with intravenous propofol as outlined in the anesthesia report.  The patient presented today in atrial fibrillation.  He was successfully cardioverted to sinus rhythm with a single synchronized biphasic 200J shock delivered with cardioversion electrodes placed in the anterior/posterior configuration.  He remains in sinus rhythm thereafter.  There were no early apparent complications.  Conclusions:  1.  Successful cardioversion of afib to sinus rhythm 2.  No early apparent complications.   Fayrene Fearing Denny Mccree,MD 8:35 AM 08/26/2012

## 2012-08-26 NOTE — Preoperative (Signed)
Beta Blockers   Reason not to administer Beta Blockers:Not Applicable 

## 2012-08-26 NOTE — Progress Notes (Signed)
   SUBJECTIVE: The patient is doing well today.  At this time, he denies chest pain, shortness of breath, or any new concerns.  He remains in afib.  QT is stable on tikosyn.     . darifenacin  15 mg Oral Daily  . dofetilide  500 mcg Oral Q12H  . donepezil  10 mg Oral QHS  . finasteride  5 mg Oral QHS  . glipiZIDE  5 mg Oral QAC breakfast  . lisinopril  10 mg Oral QHS  . loratadine  10 mg Oral Daily  . [COMPLETED] magnesium sulfate 1 - 4 g bolus IVPB  2 g Intravenous Once  . [COMPLETED] magnesium sulfate 1 - 4 g bolus IVPB  2 g Intravenous Once  . metFORMIN  250 mg Oral BID WC  . pantoprazole  20 mg Oral QAC breakfast  . Rivaroxaban  20 mg Oral Q supper  . simvastatin  40 mg Oral QHS  . sodium chloride  3 mL Intravenous Q12H      OBJECTIVE: Physical Exam: Filed Vitals:   08/25/12 1100 08/25/12 1444 08/25/12 2043 08/26/12 0609  BP: 125/80 120/76 143/75 127/73  Pulse: 82 90 55 74  Temp: 97.6 F (36.4 C) 98.6 F (37 C) 98.4 F (36.9 C) 97.5 F (36.4 C)  TempSrc: Oral Oral Oral Oral  Resp: 18 18 18 19   Height:      Weight:    287 lb 7.7 oz (130.4 kg)  SpO2: 96% 97% 97% 97%    Intake/Output Summary (Last 24 hours) at 08/26/12 0753 Last data filed at 08/26/12 5621  Gross per 24 hour  Intake   1083 ml  Output   1935 ml  Net   -852 ml    Telemetry reveals afib, no ventricular arrhythmais  GEN- The patient is well appearing, alert and oriented x 3 today.   Head- normocephalic, atraumatic Eyes-  Sclera clear, conjunctiva pink Ears- hearing intact Oropharynx- clear Neck- supple  Lungs- Clear to ausculation bilaterally, normal work of breathing Heart- irregular rate and rhythm, no murmurs, rubs or gallops, PMI not laterally displaced GI- soft, NT, ND, + BS Extremities- no clubbing, cyanosis, or edema   LABS: Basic Metabolic Panel:  Basename 08/26/12 0450 08/25/12 0520  NA 140 142  K 4.1 4.1  CL 101 103  CO2 25 28  GLUCOSE 128* 138*  BUN 16 18  CREATININE  0.75 0.86  CALCIUM 9.7 9.4  MG 1.9 1.7  PHOS -- --  Anemia Panel: No results found for this basename: VITAMINB12,FOLATE,FERRITIN,TIBC,IRON,RETICCTPCT in the last 72 hours  RADIOLOGY: No results found.  ASSESSMENT AND PLAN:  Principal Problem:  *Atrial fibrillation Active Problems:  Mixed hyperlipidemia  Hypertension  Diabetes mellitus 1. afib Tolerating tikosyn, with stable QT by ekg (reviewed) Risks, benefits, and alternatives to cardioversion were discussed at length with the patient who wishes to proceed. Will proceed with cardioversion at this time.  2 HTN Stable No changes  3. DM Stable  No changes  Hillis Range, MD 08/26/2012 7:53 AM

## 2012-08-27 ENCOUNTER — Encounter (HOSPITAL_COMMUNITY): Payer: Self-pay | Admitting: Physician Assistant

## 2012-08-27 DIAGNOSIS — J449 Chronic obstructive pulmonary disease, unspecified: Secondary | ICD-10-CM

## 2012-08-27 MED ORDER — DOFETILIDE 500 MCG PO CAPS: 500.0000 ug | ORAL_CAPSULE | Freq: Two times a day (BID) | ORAL | Status: DC

## 2012-08-27 NOTE — Discharge Summary (Signed)
Discharge Summary   Patient ID: [name] [name],  MRN: [id number], DOB/AGE: 20-Sep-1941 71 y.o.  Admit date: 08/23/2012 Discharge date: 08/27/2012  Primary Physician: [name] Peri, MD Primary Cardiologist: [name] Huh, MD; [name] Range, MD (EP)  Discharge Diagnoses Principal Problem:  *Atrial fibrillation Active Problems:  Mixed hyperlipidemia  Hypertension  Diabetes mellitus  COPD (chronic obstructive pulmonary disease)   Allergies Allergies  Allergen Reactions  . Contrast Media (Iodinated Diagnostic Agents) Rash  . Penicillins Rash  . Quinidine Rash  . Quinine Derivatives Rash    quinidine    Diagnostic Studies/Procedures  DCCV - 08/26/12  Conclusions:  1. Successful cardioversion of afib to sinus rhythm  2. No early apparent complications.  History of Present Illness/Hospital Course  [name] [name] is a 71yo male admitted to [facility] with the above problem list. He has been treated with flecainide for persistent atrial fibrillation, however this failed and was stopped 09/13. Not an ablation candidate secondary to marked LA dilatation. He was evaluated for Tikosyn initiation on 10/4, but INR was subtherapeutic on Coumadin. He was then started on Xarelto, which he has taken reliably since that time. He presented on 08/23/12 for Tikosyn initiation. He denied ischemic, arrhythmic or cardiomyopathic symptoms. Baseline QTc 435 ms. CrCl> 60. K+ > 4.0. Mg was low at 1.5. This was supplemented overnight, and received his first dose the following morning. QTc remained stable. He received six total doses of Tikosyn over three days. At one point, K was low, and repleted accordingly. He remained in a-fib. Dr. Johney [name] evaluated the patient, and the decision was made to proceed with DCCV. He was informed, consented and prepped for the procedure. He was sedated with IV propofol and cardioverted successfully to NSR with a single synchronized biphasic 200J shock. He tolerated this well w/o  complications. He has maintained NSR overnight and remained asymptomatic. He will be discharged today on Tikosyn and will resume all outpatient medications including Xarelto. He will be provided with Tikosyn tablets prior to dischargeHe will follow-up with Dr. Johney [name] in 1 month. Appointment already scheduled with Dr. Diona [name] for 11/18 noted below. EKG, BMET and Mg will be done at this time. This information has been clearly outlined in the discharge AVS.   Discharge Vitals:  Blood pressure 130/69, pulse 70, temperature 98.4 F (36.9 C), temperature source Oral, resp. rate 20, height 6\' 5"  (1.956 m), weight 129.7 kg (285 lb 15 oz), SpO2 98.00%.   Weight change: -0.7 kg (-1 lb 8.7 oz)  Labs: Recent Labs  [facility] 08/26/12 0450   WBC 9.7   HGB 15.2   HCT 44.5   MCV 90.8   PLT 140*   Lab 08/26/12 0450 08/25/12 0520 08/24/12 0500  NA 140 142 141  K 4.1 4.1 3.9  CL 101 103 103  CO2 25 28 28   BUN 16 18 15   CREATININE 0.75 0.86 0.77  CALCIUM 9.7 9.4 9.3  PROT -- -- --  BILITOT -- -- --  ALKPHOS -- -- --  ALT -- -- --  AST -- -- --  AMYLASE -- -- --  LIPASE -- -- --  GLUCOSE 128* 138* 149*   Disposition:      Discharge Orders    Future Appointments: Provider: Department: Dept Phone: Center:   09/04/2012 8:40 AM [name] Sidle, MD [facility] (near [place]) [phone] [email or web address]     Discharge Medications:    Medication List     As of 08/27/2012  8:04 AM    ASK your  doctor about these medications         albuterol 108 (90 BASE) MCG/ACT inhaler   Commonly known as: PROVENTIL HFA;VENTOLIN HFA      donepezil 10 MG tablet   Commonly known as: ARICEPT      finasteride 5 MG tablet   Commonly known as: PROSCAR      glipiZIDE 5 MG 24 hr tablet   Commonly known as: GLUCOTROL XL      HYDROcodone-acetaminophen 10-325 MG per tablet   Commonly known as: NORCO      lansoprazole 15 MG capsule   Commonly known as: PREVACID      lisinopril 10 MG tablet    Commonly known as: PRINIVIL,ZESTRIL      loratadine 10 MG tablet   Commonly known as: CLARITIN      metFORMIN 500 MG tablet   Commonly known as: GLUCOPHAGE      simvastatin 40 MG tablet   Commonly known as: ZOCOR      solifenacin 10 MG tablet   Commonly known as: VESICARE      XARELTO 20 MG Tabs   Generic drug: Rivaroxaban       Outstanding Labs/Studies: EKG, BMET and Mg on 11/18  Duration of Discharge Encounter: Greater than 30 minutes including physician time.  Signed, R. Hurman Horn, PA-C 08/27/2012, 8:04 AM    [name] Range, MD

## 2012-08-27 NOTE — Progress Notes (Signed)
   CARE MANAGEMENT NOTE 08/27/2012  Patient:  [name] [name], [name] [name]   Account Number:  [id number]  Date Initiated:  08/24/2012  Documentation initiated by:  Tera Mater  Subjective/Objective Assessment:   71yo male  admitted with AFIB. Pt. lives at home with spouse.     Action/Plan:   In to speak with pt. about Tikosyn medication. Pt. states that [facility] [place], [place].   Anticipated DC Date:  08/27/2012   Anticipated DC Plan:  HOME/SELF CARE      DC Planning Services  CM consult  Medication Assistance      Choice offered to / List presented to:             Status of service:  Completed, signed off Medicare Important Message given?   (If response is "NO", the following Medicare IM given date fields will be blank) Date Medicare IM given:   Date Additional Medicare IM given:    Discharge Disposition:  HOME/SELF CARE  Per UR Regulation:  Reviewed for med. necessity/level of care/duration of stay  If discussed at Long Length of Stay Meetings, dates discussed:    Comments:  08/26/2012 0930 NCM spoke to pt and made him aware to take his Rx to his pharmacy today so they can order medication. Tikosyn 7 day Rx to [facility] main pharmacy to fill. Pt states he does mail order. Explained to him to follow up with his MD at his appt for his Rx for mail order. And to call mail order pharmacy to follow up on their process for Tikosyn. Unit RN will pick med from main pharmacy when ready. Isidoro Donning RN CCM Case Mgmt phone [phone]  08/24/12 [facility] [place], and they stated they would not have any trouble obtaining Tikosyn within 1-2 days. Physician, please write rx for 7day Tikosyn for Tikosyn fund from [employer] and refill rx as well.  Pt. will be inpt. for at least 2 more days.  NCM to follow for further dc needs.  Tera Mater, RN, BSN NCM [phone]

## 2012-08-27 NOTE — Progress Notes (Signed)
Picked up pt's tikosyn from [employer]  Counted pills with pt and pt's family.  Counted 14 tikosyn pills.  Medication given to pt.

## 2012-08-27 NOTE — Anesthesia Postprocedure Evaluation (Signed)
  Anesthesia Post-op Note  Patient: [name] [name]  Procedure(s) Performed: Procedure(s) (LRB) with comments: CARDIOVERSION (N/A)  Patient Location: 4733  Anesthesia Type:General  Level of Consciousness: awake, alert  and oriented  Airway and Oxygen Therapy: Patient Spontanous Breathing and Patient connected to nasal cannula oxygen  Post-op Pain: none  Post-op Assessment: Post-op Vital signs reviewed  Post-op Vital Signs: stable  Complications: No apparent anesthesia complications

## 2012-08-27 NOTE — Progress Notes (Signed)
Patient Name: [name] [name] Date of Encounter: 08/27/2012     Principal Problem:  *Atrial fibrillation Active Problems:  Mixed hyperlipidemia  Hypertension  Diabetes mellitus    SUBJECTIVE: Feels well this AM. No complaints. No cp, SOB, lightheadedness or palpitations.    OBJECTIVE  Filed Vitals:   08/26/12 0837 08/26/12 1501 08/26/12 2153 08/27/12 0639  BP:  127/82 129/78 130/69  Pulse:  72 70 70  Temp:  98.2 F (36.8 C) 98.5 F (36.9 C) 98.4 F (36.9 C)  TempSrc:  Oral Oral Oral  Resp: 20 18 18 20   Height:      Weight:    129.7 kg (285 lb 15 oz)  SpO2: 95% 98% 98% 98%    Intake/Output Summary (Last 24 hours) at 08/27/12 0744 Last data filed at 08/27/12 0700  Gross per 24 hour  Intake    480 ml  Output    900 ml  Net   -420 ml   Weight change: -0.7 kg (-1 lb 8.7 oz)  PHYSICAL EXAM  General: Well developed, well nourished, in no acute distress. Head:  Normocephalic, atraumatic, sclera non-icteric, no xanthomas, nares are without discharge.  Neck: Supple without bruits or JVD. Lungs:  Intermittent centralized rhonchi which clears w/ cough. Resp regular and unlabored, otherwise CTA. Heart: RRR no s3, s4, or murmurs. Abdomen: Soft, non-tender, non-distended, BS + x 4.  Msk:  Strength and tone appears normal for age. Extremities:  No clubbing, cyanosis or edema. DP/PT/Radials 2+ and equal bilaterally. Neuro: Alert and oriented X 3. Moves all extremities spontaneously. Psych: Normal affect.  LABS:  Recent Labs  [facility] 08/26/12 0450   WBC 9.7   HGB 15.2   HCT 44.5   MCV 90.8   PLT 140*   Lab 08/26/12 0450 08/25/12 0520 08/24/12 0500  NA 140 142 141  K 4.1 4.1 3.9  CL 101 103 103  CO2 25 28 28   BUN 16 18 15   CREATININE 0.75 0.86 0.77  CALCIUM 9.7 9.4 9.3  PROT -- -- --  BILITOT -- -- --  ALKPHOS -- -- --  ALT -- -- --  AST -- -- --  AMYLASE -- -- --  LIPASE -- -- --  GLUCOSE 128* 138* 149*    TELE: NSR, 60-80 bpm, occasional  PACs  ECG: NSR, 81 bpm, 1st degree AVB, IVCD, LAD   Radiology/Studies:  No results found.  Current Medications:     . darifenacin  15 mg Oral Daily  . dofetilide  500 mcg Oral Q12H  . donepezil  10 mg Oral QHS  . finasteride  5 mg Oral QHS  . glipiZIDE  5 mg Oral QAC breakfast  . lisinopril  10 mg Oral QHS  . loratadine  10 mg Oral Daily  . metFORMIN  250 mg Oral BID WC  . pantoprazole  20 mg Oral QAC breakfast  . Rivaroxaban  20 mg Oral Q supper  . simvastatin  40 mg Oral QHS  . sodium chloride  3 mL Intravenous Q12H  . sodium chloride  3 mL Intravenous Q12H    ASSESSMENT AND PLAN:  1. Persistent atrial fibrillation- s/p DCCV yesterday. Maintaining SR. Feels well this AM. QTc stable (473 ms). Plan for discharge this morning. Continue Tikosyn 500 mcg PO BID. Continue Xarelto. Follow-up with EP, will discuss with MD timing of this.   2. HTN- well-controlled. Continue outpatient antihypertensives.   3. DM- well-controlled. Continue metformin, glipizide.   4. Dyslipidemia- continue statin.  5. GERD- stable. Continue PPI.   6. COPD- chronic bronchitic cough noted. Stable.   Signed, R. Hurman Horn, PA-C 08/27/2012, 7:44 AM   I have seen, examined the patient, and reviewed the above assessment and plan.  Changes to above are made where necessary.  maintaining sinus rhythm with tikosyn.  QT is stable. Discharge to home. Follow-up with Dr Diona Browner for ekg, bmet, and mg in 1 week  Co Sign: Hillis Range, MD 08/27/2012 9:04 AM

## 2012-08-28 ENCOUNTER — Encounter (HOSPITAL_COMMUNITY): Payer: Self-pay | Admitting: Internal Medicine

## 2012-08-29 ENCOUNTER — Encounter: Payer: Self-pay | Admitting: Internal Medicine

## 2012-08-30 NOTE — Transfer of Care (Signed)
Immediate Anesthesia Transfer of Care Note  Patient: [name] [name]  Procedure(s) Performed: Procedure(s) (LRB) with comments: CARDIOVERSION (N/A)  Patient Location: PACU and Nursing Unit  Anesthesia Type:General  Level of Consciousness: awake, oriented and patient cooperative  Airway & Oxygen Therapy: Patient Spontanous Breathing  Post-op Assessment: Post -op Vital signs reviewed and stable and Patient moving all extremities X 4  Post vital signs: Reviewed and stable  Complications: No apparent anesthesia complications

## 2012-09-01 NOTE — Anesthesia Postprocedure Evaluation (Signed)
  Anesthesia Post-op Note  Patient: [name] [name]  Procedure(s) Performed: Procedure(s) (LRB) with comments: CARDIOVERSION (N/A)  Patient Location: Nursing Unit  Anesthesia Type:General  Level of Consciousness: awake, alert  and oriented  Airway and Oxygen Therapy: Patient Spontanous Breathing and Patient connected to nasal cannula oxygen  Post-op Pain: none  Post-op Assessment: Post-op Vital signs reviewed and Patient's Cardiovascular Status Stable  Post-op Vital Signs: stable  Complications: No apparent anesthesia complications

## 2012-09-04 ENCOUNTER — Ambulatory Visit (INDEPENDENT_AMBULATORY_CARE_PROVIDER_SITE_OTHER): Payer: Medicare Other | Admitting: Internal Medicine

## 2012-09-04 ENCOUNTER — Encounter: Payer: Self-pay | Admitting: Internal Medicine

## 2012-09-04 ENCOUNTER — Ambulatory Visit: Payer: Medicare Other | Admitting: Cardiology

## 2012-09-04 VITALS — BP 126/67 | Ht 77.0 in | Wt 296.8 lb

## 2012-09-04 DIAGNOSIS — I4891 Unspecified atrial fibrillation: Secondary | ICD-10-CM

## 2012-09-04 DIAGNOSIS — I10 Essential (primary) hypertension: Secondary | ICD-10-CM

## 2012-09-04 DIAGNOSIS — R0683 Snoring: Secondary | ICD-10-CM

## 2012-09-04 DIAGNOSIS — R0609 Other forms of dyspnea: Secondary | ICD-10-CM

## 2012-09-04 MED ORDER — DILTIAZEM HCL ER COATED BEADS 180 MG PO CP24: 180.0000 mg | ORAL_CAPSULE | Freq: Every day | ORAL | Status: AC

## 2012-09-04 NOTE — Assessment & Plan Note (Signed)
He has been diagnosed with sleep apnea Compliance with CPAP is advised

## 2012-09-04 NOTE — Assessment & Plan Note (Signed)
He has longstanding persistent afib with severe LA enlargement.  He has failed medical therapy with tikosyn.  I think that given absence of improvement in sinus that I would favor a rate control strategy long term. Stop tikosyn today Add diltiazem CD 180mg  daily today.  This can be uptitrated by Dr Diona Browner. Continue xarelto and enroll in [place] anticoagulation registry  I will see as needed going forward

## 2012-09-04 NOTE — Patient Instructions (Addendum)
Keep your follow up appointment with Dr Diona Browner as scheduled  Your physician has recommended you make the following change in your medication:  1) Start Cardizem(Diltiazem) 180mg  daily 2) Stop Tikosyn

## 2012-09-04 NOTE — Assessment & Plan Note (Signed)
Stable No change required today  

## 2012-09-04 NOTE — Progress Notes (Signed)
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  He was recently placed on tikosyn and cardioverted.  He did not notice any significant improvements in quality in sinus.  He underwent sleep study last week and reports that he felt "great" after using CPAP.  Today, he denies symptoms of palpitations, chest pain, shortness of breath,  lower extremity edema, dizziness, presyncope, or syncope.  The patient is otherwise without complaint today.   Past Medical History  Diagnosis Date  . Persistent atrial fibrillation   . Essential hypertension, benign   . COPD (chronic obstructive pulmonary disease)   . Bronchiectasis     L lung lobe removed  . Recurrent upper respiratory infection (URI)   . GERD (gastroesophageal reflux disease)   . Impaired vision     Wears contacts or glasses  . Memory difficulties     Forgetfullness, taking Aricept preventatively, father had Alzheimers  . Obesity   . Left atrial enlargement     LA size 57mm by echo  . OSA (obstructive sleep apnea) 07/05/2012    "being fitted for machine" (08/23/2012)  . Hypercholesteremia   . Chronic bronchitis     "removed most of my left lung to clear it up in ~ 1957" (08/23/2012)  . Exertional dyspnea   . Type 2 diabetes mellitus   . Arthritis     "wrists, fingers, shoulders, knees" (08/23/2012)  . History of gout     "haven't had it in years" (08/23/2012)  . Kidney stones     "once" (08/23/2012)   Past Surgical History  Procedure Date  . Lung lobectomy 1959    Left  . Tonsillectomy ~ 1946  . Replacement disc anterior lumbar spine 2011    "put rods in" (08/23/2012)  . Lumbar disc surgery 2010  . Incise and drain abcess 2006    Posterior neck  . Ureterolithotomy     stone removed from ureter  . Total hip arthroplasty 08/23/2011    Procedure: TOTAL HIP ARTHROPLASTY;  Surgeon: Raymon Mutton, MD;  Location: [facility] OR;  Service:  Orthopedics;  Laterality: Right;  . Cardiac catheterization 6/09    Normal coronaries  . Cardioversion 08/2012    successfully restored to NSR  . Cardioversion 08/26/2012    Procedure: CARDIOVERSION;  Surgeon: Hillis Range, MD;  Location: [facility];  Service: Cardiovascular;  Laterality: N/A;    Current Outpatient Prescriptions  Medication Sig Dispense Refill  . albuterol (PROVENTIL HFA;VENTOLIN HFA) 108 (90 BASE) MCG/ACT inhaler Inhale 2 puffs into the lungs every 6 (six) hours as needed. For shortness of breath       . dofetilide (TIKOSYN) 500 MCG capsule Take 1 capsule (500 mcg total) by mouth every 12 (twelve) hours.  60 capsule  3  . donepezil (ARICEPT) 10 MG tablet Take 10 mg by mouth at bedtime.        . finasteride (PROSCAR) 5 MG tablet Take 5 mg by mouth at bedtime.       Marland Kitchen glipiZIDE (GLUCOTROL XL) 5 MG 24 hr tablet Take 5 mg by mouth daily.        Marland Kitchen HYDROcodone-acetaminophen (NORCO) 10-325 MG per tablet Take 1 tablet by mouth every 6 (six) hours as needed. For pain.      Marland Kitchen lansoprazole (PREVACID) 15 MG capsule Take 15 mg by mouth 2 (two) times daily.        Marland Kitchen lisinopril (PRINIVIL,ZESTRIL) 10 MG  tablet Take 10 mg by mouth at bedtime.       Marland Kitchen loratadine (CLARITIN) 10 MG tablet Take 10 mg by mouth daily.      . metFORMIN (GLUCOPHAGE) 500 MG tablet Take 250 mg by mouth 2 (two) times daily with a meal.       . Rivaroxaban (XARELTO) 20 MG TABS Take 20 mg by mouth daily at 8 pm.      . simvastatin (ZOCOR) 40 MG tablet Take 40 mg by mouth at bedtime.        . solifenacin (VESICARE) 10 MG tablet Take 10 mg by mouth at bedtime.        Physical Exam: Filed Vitals:   09/04/12 0905  BP: 126/67  Height: 6\' 5"  (1.956 m)  Weight: 296 lb 12.8 oz (134.628 kg)    GEN- The patient is morbidly obese appearing, alert and oriented x 3 today.   Head- normocephalic, atraumatic Eyes-  Sclera clear, conjunctiva pink Ears- hearing intact Oropharynx- clear Lungs- Clear to ausculation bilaterally,  normal work of breathing Heart- irregular rate and rhythm, no murmurs, rubs or gallops, PMI not laterally displaced GI- soft, NT, ND, + BS Extremities- no clubbing, cyanosis, or edema  ekg today reveals afib, V rate 117  Assessment and Plan:

## 2012-09-06 ENCOUNTER — Encounter: Payer: Self-pay | Admitting: Cardiology

## 2012-09-06 ENCOUNTER — Ambulatory Visit (INDEPENDENT_AMBULATORY_CARE_PROVIDER_SITE_OTHER): Payer: Medicare Other | Admitting: Cardiology

## 2012-09-06 VITALS — BP 144/70 | HR 76 | Ht 77.0 in | Wt 300.0 lb

## 2012-09-06 DIAGNOSIS — R0683 Snoring: Secondary | ICD-10-CM

## 2012-09-06 DIAGNOSIS — R0609 Other forms of dyspnea: Secondary | ICD-10-CM

## 2012-09-06 DIAGNOSIS — I4891 Unspecified atrial fibrillation: Secondary | ICD-10-CM

## 2012-09-06 NOTE — Assessment & Plan Note (Signed)
States that he was diagnosed with obstructive sleep apnea, awaits CPAP machine arrival.

## 2012-09-06 NOTE — Progress Notes (Signed)
Clinical Summary [name] [name] is a 71 y.o.male presenting for followup. He was seen in October. In the interim he was initiated on Tikosyn and failed maintenance of sinus rhythm following cardioversion, was ultimately taken off of Tikosyn by Dr. Johney Frame on 11/18, continued on Xarelto. Cardizem CD was added for rate control.  He is here with his wife. Reports no significant change in symptoms. I reviewed his medications. We discussed rationale for continuing strategy of heart rate control and anticoagulation. No further attempts at restoring sinus rhythm.   Allergies  Allergen Reactions  . Contrast Media (Iodinated Diagnostic Agents) Rash  . Penicillins Rash  . Quinidine Rash  . Quinine Derivatives Rash    quinidine    Current Outpatient Prescriptions  Medication Sig Dispense Refill  . albuterol (PROVENTIL HFA;VENTOLIN HFA) 108 (90 BASE) MCG/ACT inhaler Inhale 2 puffs into the lungs every 6 (six) hours as needed. For shortness of breath       . diltiazem (CARDIZEM CD) 180 MG 24 hr capsule Take 1 capsule (180 mg total) by mouth daily.  90 capsule  3  . donepezil (ARICEPT) 10 MG tablet Take 10 mg by mouth at bedtime.        . finasteride (PROSCAR) 5 MG tablet Take 5 mg by mouth at bedtime.       Marland Kitchen glipiZIDE (GLUCOTROL XL) 5 MG 24 hr tablet Take 5 mg by mouth daily.        Marland Kitchen HYDROcodone-acetaminophen (NORCO) 10-325 MG per tablet Take 1 tablet by mouth every 6 (six) hours as needed. For pain.      Marland Kitchen lansoprazole (PREVACID) 15 MG capsule Take 15 mg by mouth 2 (two) times daily.        Marland Kitchen lisinopril (PRINIVIL,ZESTRIL) 10 MG tablet Take 10 mg by mouth at bedtime.       Marland Kitchen loratadine (CLARITIN) 10 MG tablet Take 10 mg by mouth daily.      . metFORMIN (GLUCOPHAGE) 500 MG tablet Take 250 mg by mouth 2 (two) times daily with a meal.       . Rivaroxaban (XARELTO) 20 MG TABS Take 20 mg by mouth daily at 8 pm.      . simvastatin (ZOCOR) 40 MG tablet Take 40 mg by mouth at bedtime.        . solifenacin  (VESICARE) 10 MG tablet Take 10 mg by mouth at bedtime.        Past Medical History  Diagnosis Date  . Persistent atrial fibrillation   . Essential hypertension, benign   . COPD (chronic obstructive pulmonary disease)   . Bronchiectasis     L lung lobe removed  . Recurrent upper respiratory infection (URI)   . GERD (gastroesophageal reflux disease)   . Impaired vision     Wears contacts or glasses  . Memory difficulties     Forgetfullness, taking Aricept preventatively, father had Alzheimers  . Obesity   . Left atrial enlargement     LA size 57mm by echo  . OSA (obstructive sleep apnea)   . Hypercholesteremia   . Chronic bronchitis     "removed most of my left lung to clear it up in ~ 1957" (08/23/2012)  . Type 2 diabetes mellitus   . Arthritis     "wrists, fingers, shoulders, knees" (08/23/2012)  . History of gout   . Kidney stones     Social History [name] [name] reports that he has never smoked. He has never used smokeless tobacco. [name] [name] reports  that he drinks alcohol.  Review of Systems Awaits CPAP machine. Limited by chronic back and hip pain. Uses a cane. Otherwise negative.  Physical Examination Filed Vitals:   09/06/12 1450  BP: 144/70  Pulse: 76   Filed Weights   09/06/12 1450  Weight: 300 lb (136.079 kg)    HEENT: Conjunctiva and lids normal, oropharynx clear.  Neck: Supple, increased girth, no obvious elevated JVP or carotid bruits, no thyromegaly.  Lungs: Clear to auscultation, nonlabored breathing at rest.  Cardiac: Irregularly irregular, no S3 or significant systolic murmur, no pericardial rub.  Abdomen: Soft, nontender, obese, bowel sounds present, no guarding or rebound.  Extremities: Trace edema, distal pulses 2+.    Problem List and Plan   Atrial fibrillation Has failed cardioversion attempts, also transition to Tikosyn was not effective. For now we will continue strategy of heart rate control with Cardizem CD and anticoagulation on  Coumadin.  Snoring States that he was diagnosed with obstructive sleep apnea, awaits CPAP machine arrival.    Jonelle Sidle, M.D., F.A.C.C.

## 2012-09-06 NOTE — Assessment & Plan Note (Signed)
Has failed cardioversion attempts, also transition to Tikosyn was not effective. For now we will continue strategy of heart rate control with Cardizem CD and anticoagulation on Coumadin.

## 2012-09-06 NOTE — Patient Instructions (Addendum)
Your physician recommends that you schedule a follow-up appointment in: 3 months. Your physician recommends that you continue on your current medications as directed. Please refer to the Current Medication list given to you today. 

## 2012-11-15 ENCOUNTER — Other Ambulatory Visit: Payer: Self-pay

## 2012-11-15 MED ORDER — RIVAROXABAN 20 MG PO TABS: 20.0000 mg | ORAL_TABLET | Freq: Every day | ORAL | Status: DC

## 2012-12-08 ENCOUNTER — Ambulatory Visit (INDEPENDENT_AMBULATORY_CARE_PROVIDER_SITE_OTHER): Payer: Medicare Other | Admitting: Cardiology

## 2012-12-08 ENCOUNTER — Encounter: Payer: Self-pay | Admitting: Cardiology

## 2012-12-08 VITALS — BP 110/70 | HR 85 | Ht 77.0 in | Wt 295.0 lb

## 2012-12-08 DIAGNOSIS — I4891 Unspecified atrial fibrillation: Secondary | ICD-10-CM

## 2012-12-08 DIAGNOSIS — I10 Essential (primary) hypertension: Secondary | ICD-10-CM

## 2012-12-08 NOTE — Assessment & Plan Note (Signed)
Blood pressure is normal today. 

## 2012-12-08 NOTE — Patient Instructions (Addendum)

## 2012-12-08 NOTE — Assessment & Plan Note (Signed)
Persistent, rate controlled on current regimen. Continue Cardizem CD and Xarelto.

## 2012-12-08 NOTE — Progress Notes (Signed)
Clinical Summary Mr. Gittleman is a medically complex 72 y.o.male presenting for followup. He last saw him in November 2013.  He has been treated with strategy of heart rate control and anticoagulation for persistent atrial fibrillation after failed cardioversion attempts and also attempted transition to Tikosyn.  He reports no changes in symptomatology. ECG today shows atrial fibrillation at 85 beats per minute. Medication list still included Tikosyn, although this should already have been discontinued prior to our last visit. He did not bring his medications in today. His wife helps with his medications and this will be clarified with her.  He reports no bleeding episodes.   Allergies  Allergen Reactions  . Contrast Media (Iodinated Diagnostic Agents) Rash  . Penicillins Rash  . Quinidine Rash  . Quinine Derivatives Rash    quinidine    Current Outpatient Prescriptions  Medication Sig Dispense Refill  . albuterol (PROVENTIL HFA;VENTOLIN HFA) 108 (90 BASE) MCG/ACT inhaler Inhale 2 puffs into the lungs every 6 (six) hours as needed. For shortness of breath       . diltiazem (CARDIZEM CD) 180 MG 24 hr capsule Take 1 capsule (180 mg total) by mouth daily.  90 capsule  3  . donepezil (ARICEPT) 10 MG tablet Take 10 mg by mouth at bedtime.        . finasteride (PROSCAR) 5 MG tablet Take 5 mg by mouth at bedtime.       . furosemide (LASIX) 20 MG tablet Take 20 mg by mouth daily.       Marland Kitchen glipiZIDE (GLUCOTROL XL) 5 MG 24 hr tablet Take 5 mg by mouth daily.        Marland Kitchen HYDROcodone-acetaminophen (NORCO) 10-325 MG per tablet Take 1 tablet by mouth every 6 (six) hours as needed. For pain.      Marland Kitchen lansoprazole (PREVACID) 15 MG capsule Take 15 mg by mouth 2 (two) times daily.        Marland Kitchen lisinopril (PRINIVIL,ZESTRIL) 10 MG tablet Take 10 mg by mouth at bedtime.       Marland Kitchen loratadine (CLARITIN) 10 MG tablet Take 10 mg by mouth daily.      . metFORMIN (GLUCOPHAGE) 500 MG tablet Take 250 mg by mouth 2 (two) times  daily with a meal.       . Rivaroxaban (XARELTO) 20 MG TABS Take 1 tablet (20 mg total) by mouth daily at 8 pm.  90 tablet  3  . simvastatin (ZOCOR) 40 MG tablet Take 40 mg by mouth at bedtime.        . solifenacin (VESICARE) 10 MG tablet Take 10 mg by mouth at bedtime.       No current facility-administered medications for this visit.    Past Medical History  Diagnosis Date  . Persistent atrial fibrillation   . Essential hypertension, benign   . COPD (chronic obstructive pulmonary disease)   . Bronchiectasis     L lung lobe removed  . Recurrent upper respiratory infection (URI)   . GERD (gastroesophageal reflux disease)   . Impaired vision     Wears contacts or glasses  . Memory difficulties     Forgetfullness, taking Aricept preventatively, father had Alzheimers  . Obesity   . Left atrial enlargement     LA size 57mm by echo  . OSA (obstructive sleep apnea)   . Hypercholesteremia   . Chronic bronchitis   . Type 2 diabetes mellitus   . Arthritis   . History of gout   .  Nephrolithiasis     Social History Mr. Space reports that he has never smoked. He has never used smokeless tobacco. Mr. Gladu reports that  drinks alcohol.  Review of Systems Chronic arthritic pain, uses a cane. No falls. Stable appetite. Otherwise negative  Physical Examination Filed Vitals:   12/08/12 1314  BP: 110/70  Pulse: 85   Filed Weights   12/08/12 1314  Weight: 295 lb (133.811 kg)    HEENT: Conjunctiva and lids normal, oropharynx clear.  Neck: Supple, increased girth, no obvious elevated JVP or carotid bruits, no thyromegaly.  Lungs: Clear to auscultation, nonlabored breathing at rest.  Cardiac: Irregularly irregular, no S3 or significant systolic murmur, no pericardial rub.  Abdomen: Soft, nontender, obese, bowel sounds present, no guarding or rebound.  Extremities: Trace edema, distal pulses 2+.  Skin: Warm and dry.    Problem List and Plan   Atrial fibrillation Persistent,  rate controlled on current regimen. Continue Cardizem CD and Xarelto.  Hypertension Blood pressure is normal today.    Jonelle Sidle, M.D., F.A.C.C.

## 2013-05-23 ENCOUNTER — Other Ambulatory Visit: Payer: Self-pay

## 2013-08-23 ENCOUNTER — Other Ambulatory Visit: Payer: Self-pay

## 2015-11-05 DIAGNOSIS — I4891 Unspecified atrial fibrillation: Secondary | ICD-10-CM | POA: Diagnosis not present

## 2015-11-05 DIAGNOSIS — J069 Acute upper respiratory infection, unspecified: Secondary | ICD-10-CM | POA: Diagnosis not present

## 2015-11-05 DIAGNOSIS — Z789 Other specified health status: Secondary | ICD-10-CM | POA: Diagnosis not present

## 2015-11-05 DIAGNOSIS — Z6836 Body mass index (BMI) 36.0-36.9, adult: Secondary | ICD-10-CM | POA: Diagnosis not present

## 2015-11-25 DIAGNOSIS — R39198 Other difficulties with micturition: Secondary | ICD-10-CM | POA: Diagnosis not present

## 2015-11-25 DIAGNOSIS — R3915 Urgency of urination: Secondary | ICD-10-CM | POA: Diagnosis not present

## 2015-12-03 DIAGNOSIS — I4891 Unspecified atrial fibrillation: Secondary | ICD-10-CM | POA: Diagnosis not present

## 2016-01-07 DIAGNOSIS — E7800 Pure hypercholesterolemia, unspecified: Secondary | ICD-10-CM | POA: Diagnosis not present

## 2016-01-07 DIAGNOSIS — I4891 Unspecified atrial fibrillation: Secondary | ICD-10-CM | POA: Diagnosis not present

## 2016-01-07 DIAGNOSIS — E1165 Type 2 diabetes mellitus with hyperglycemia: Secondary | ICD-10-CM | POA: Diagnosis not present

## 2016-01-28 DIAGNOSIS — J069 Acute upper respiratory infection, unspecified: Secondary | ICD-10-CM | POA: Diagnosis not present

## 2016-01-28 DIAGNOSIS — I4891 Unspecified atrial fibrillation: Secondary | ICD-10-CM | POA: Diagnosis not present

## 2016-02-09 DIAGNOSIS — M159 Polyosteoarthritis, unspecified: Secondary | ICD-10-CM | POA: Diagnosis not present

## 2016-02-09 DIAGNOSIS — I10 Essential (primary) hypertension: Secondary | ICD-10-CM | POA: Diagnosis not present

## 2016-02-09 DIAGNOSIS — I2510 Atherosclerotic heart disease of native coronary artery without angina pectoris: Secondary | ICD-10-CM | POA: Diagnosis not present

## 2016-02-09 DIAGNOSIS — E119 Type 2 diabetes mellitus without complications: Secondary | ICD-10-CM | POA: Diagnosis not present

## 2016-02-17 DIAGNOSIS — Z789 Other specified health status: Secondary | ICD-10-CM | POA: Diagnosis not present

## 2016-02-17 DIAGNOSIS — I4891 Unspecified atrial fibrillation: Secondary | ICD-10-CM | POA: Diagnosis not present

## 2016-02-17 DIAGNOSIS — E1165 Type 2 diabetes mellitus with hyperglycemia: Secondary | ICD-10-CM | POA: Diagnosis not present

## 2016-02-17 DIAGNOSIS — J209 Acute bronchitis, unspecified: Secondary | ICD-10-CM | POA: Diagnosis not present

## 2016-02-17 DIAGNOSIS — J449 Chronic obstructive pulmonary disease, unspecified: Secondary | ICD-10-CM | POA: Diagnosis not present

## 2016-02-17 DIAGNOSIS — I10 Essential (primary) hypertension: Secondary | ICD-10-CM | POA: Diagnosis not present

## 2016-02-26 DIAGNOSIS — J449 Chronic obstructive pulmonary disease, unspecified: Secondary | ICD-10-CM | POA: Diagnosis not present

## 2016-02-26 DIAGNOSIS — E7800 Pure hypercholesterolemia, unspecified: Secondary | ICD-10-CM | POA: Diagnosis not present

## 2016-02-26 DIAGNOSIS — I4891 Unspecified atrial fibrillation: Secondary | ICD-10-CM | POA: Diagnosis not present

## 2016-02-27 DIAGNOSIS — I10 Essential (primary) hypertension: Secondary | ICD-10-CM | POA: Diagnosis not present

## 2016-02-27 DIAGNOSIS — M159 Polyosteoarthritis, unspecified: Secondary | ICD-10-CM | POA: Diagnosis not present

## 2016-02-27 DIAGNOSIS — E119 Type 2 diabetes mellitus without complications: Secondary | ICD-10-CM | POA: Diagnosis not present

## 2016-02-27 DIAGNOSIS — I2510 Atherosclerotic heart disease of native coronary artery without angina pectoris: Secondary | ICD-10-CM | POA: Diagnosis not present

## 2016-03-18 DIAGNOSIS — Z23 Encounter for immunization: Secondary | ICD-10-CM | POA: Diagnosis not present

## 2016-03-25 DIAGNOSIS — K219 Gastro-esophageal reflux disease without esophagitis: Secondary | ICD-10-CM | POA: Diagnosis not present

## 2016-03-25 DIAGNOSIS — I4891 Unspecified atrial fibrillation: Secondary | ICD-10-CM | POA: Diagnosis not present

## 2016-03-25 DIAGNOSIS — N182 Chronic kidney disease, stage 2 (mild): Secondary | ICD-10-CM | POA: Diagnosis not present

## 2016-03-25 DIAGNOSIS — E1122 Type 2 diabetes mellitus with diabetic chronic kidney disease: Secondary | ICD-10-CM | POA: Diagnosis not present

## 2016-04-09 DIAGNOSIS — M159 Polyosteoarthritis, unspecified: Secondary | ICD-10-CM | POA: Diagnosis not present

## 2016-04-09 DIAGNOSIS — I2510 Atherosclerotic heart disease of native coronary artery without angina pectoris: Secondary | ICD-10-CM | POA: Diagnosis not present

## 2016-04-09 DIAGNOSIS — E119 Type 2 diabetes mellitus without complications: Secondary | ICD-10-CM | POA: Diagnosis not present

## 2016-04-09 DIAGNOSIS — I10 Essential (primary) hypertension: Secondary | ICD-10-CM | POA: Diagnosis not present

## 2016-04-23 DIAGNOSIS — E119 Type 2 diabetes mellitus without complications: Secondary | ICD-10-CM | POA: Diagnosis not present

## 2016-04-23 DIAGNOSIS — M159 Polyosteoarthritis, unspecified: Secondary | ICD-10-CM | POA: Diagnosis not present

## 2016-04-23 DIAGNOSIS — I10 Essential (primary) hypertension: Secondary | ICD-10-CM | POA: Diagnosis not present

## 2016-04-23 DIAGNOSIS — I2510 Atherosclerotic heart disease of native coronary artery without angina pectoris: Secondary | ICD-10-CM | POA: Diagnosis not present

## 2016-04-26 DIAGNOSIS — E7800 Pure hypercholesterolemia, unspecified: Secondary | ICD-10-CM | POA: Diagnosis not present

## 2016-04-26 DIAGNOSIS — I4891 Unspecified atrial fibrillation: Secondary | ICD-10-CM | POA: Diagnosis not present

## 2016-04-26 DIAGNOSIS — E1122 Type 2 diabetes mellitus with diabetic chronic kidney disease: Secondary | ICD-10-CM | POA: Diagnosis not present

## 2016-04-26 DIAGNOSIS — Z299 Encounter for prophylactic measures, unspecified: Secondary | ICD-10-CM | POA: Diagnosis not present

## 2016-04-26 DIAGNOSIS — N182 Chronic kidney disease, stage 2 (mild): Secondary | ICD-10-CM | POA: Diagnosis not present

## 2016-04-26 DIAGNOSIS — I10 Essential (primary) hypertension: Secondary | ICD-10-CM | POA: Diagnosis not present

## 2016-04-29 DIAGNOSIS — J449 Chronic obstructive pulmonary disease, unspecified: Secondary | ICD-10-CM | POA: Diagnosis not present

## 2016-04-29 DIAGNOSIS — Z789 Other specified health status: Secondary | ICD-10-CM | POA: Diagnosis not present

## 2016-05-17 DIAGNOSIS — J209 Acute bronchitis, unspecified: Secondary | ICD-10-CM | POA: Diagnosis not present

## 2016-05-17 DIAGNOSIS — J449 Chronic obstructive pulmonary disease, unspecified: Secondary | ICD-10-CM | POA: Diagnosis not present

## 2016-05-17 DIAGNOSIS — I10 Essential (primary) hypertension: Secondary | ICD-10-CM | POA: Diagnosis not present

## 2016-05-17 DIAGNOSIS — I4891 Unspecified atrial fibrillation: Secondary | ICD-10-CM | POA: Diagnosis not present

## 2016-05-17 DIAGNOSIS — Z789 Other specified health status: Secondary | ICD-10-CM | POA: Diagnosis not present

## 2016-06-15 DIAGNOSIS — I2510 Atherosclerotic heart disease of native coronary artery without angina pectoris: Secondary | ICD-10-CM | POA: Diagnosis not present

## 2016-06-15 DIAGNOSIS — E119 Type 2 diabetes mellitus without complications: Secondary | ICD-10-CM | POA: Diagnosis not present

## 2016-06-15 DIAGNOSIS — I10 Essential (primary) hypertension: Secondary | ICD-10-CM | POA: Diagnosis not present

## 2016-06-15 DIAGNOSIS — M159 Polyosteoarthritis, unspecified: Secondary | ICD-10-CM | POA: Diagnosis not present

## 2016-07-05 DIAGNOSIS — R2681 Unsteadiness on feet: Secondary | ICD-10-CM | POA: Diagnosis not present

## 2016-07-05 DIAGNOSIS — Z713 Dietary counseling and surveillance: Secondary | ICD-10-CM | POA: Diagnosis not present

## 2016-07-05 DIAGNOSIS — Z6836 Body mass index (BMI) 36.0-36.9, adult: Secondary | ICD-10-CM | POA: Diagnosis not present

## 2016-07-05 DIAGNOSIS — I4891 Unspecified atrial fibrillation: Secondary | ICD-10-CM | POA: Diagnosis not present

## 2016-07-07 DIAGNOSIS — R269 Unspecified abnormalities of gait and mobility: Secondary | ICD-10-CM | POA: Diagnosis not present

## 2016-07-13 DIAGNOSIS — R269 Unspecified abnormalities of gait and mobility: Secondary | ICD-10-CM | POA: Diagnosis not present

## 2016-07-28 DIAGNOSIS — Z87891 Personal history of nicotine dependence: Secondary | ICD-10-CM | POA: Diagnosis not present

## 2016-07-28 DIAGNOSIS — Z6836 Body mass index (BMI) 36.0-36.9, adult: Secondary | ICD-10-CM | POA: Diagnosis not present

## 2016-07-28 DIAGNOSIS — J069 Acute upper respiratory infection, unspecified: Secondary | ICD-10-CM | POA: Diagnosis not present

## 2016-07-28 DIAGNOSIS — R350 Frequency of micturition: Secondary | ICD-10-CM | POA: Diagnosis not present

## 2016-07-28 DIAGNOSIS — M48061 Spinal stenosis, lumbar region without neurogenic claudication: Secondary | ICD-10-CM | POA: Diagnosis not present

## 2016-07-28 DIAGNOSIS — M549 Dorsalgia, unspecified: Secondary | ICD-10-CM | POA: Diagnosis not present

## 2016-07-28 DIAGNOSIS — I4891 Unspecified atrial fibrillation: Secondary | ICD-10-CM | POA: Diagnosis not present

## 2016-08-02 DIAGNOSIS — E119 Type 2 diabetes mellitus without complications: Secondary | ICD-10-CM | POA: Diagnosis not present

## 2016-08-02 DIAGNOSIS — I2510 Atherosclerotic heart disease of native coronary artery without angina pectoris: Secondary | ICD-10-CM | POA: Diagnosis not present

## 2016-08-02 DIAGNOSIS — M159 Polyosteoarthritis, unspecified: Secondary | ICD-10-CM | POA: Diagnosis not present

## 2016-08-02 DIAGNOSIS — I10 Essential (primary) hypertension: Secondary | ICD-10-CM | POA: Diagnosis not present

## 2016-08-11 DIAGNOSIS — N182 Chronic kidney disease, stage 2 (mild): Secondary | ICD-10-CM | POA: Diagnosis not present

## 2016-08-11 DIAGNOSIS — E1122 Type 2 diabetes mellitus with diabetic chronic kidney disease: Secondary | ICD-10-CM | POA: Diagnosis not present

## 2016-08-11 DIAGNOSIS — Z6836 Body mass index (BMI) 36.0-36.9, adult: Secondary | ICD-10-CM | POA: Diagnosis not present

## 2016-08-11 DIAGNOSIS — Z299 Encounter for prophylactic measures, unspecified: Secondary | ICD-10-CM | POA: Diagnosis not present

## 2016-08-11 DIAGNOSIS — I4891 Unspecified atrial fibrillation: Secondary | ICD-10-CM | POA: Diagnosis not present

## 2016-08-30 DIAGNOSIS — Z23 Encounter for immunization: Secondary | ICD-10-CM | POA: Diagnosis not present

## 2016-09-13 DIAGNOSIS — E119 Type 2 diabetes mellitus without complications: Secondary | ICD-10-CM | POA: Diagnosis not present

## 2016-09-13 DIAGNOSIS — M159 Polyosteoarthritis, unspecified: Secondary | ICD-10-CM | POA: Diagnosis not present

## 2016-09-13 DIAGNOSIS — I2510 Atherosclerotic heart disease of native coronary artery without angina pectoris: Secondary | ICD-10-CM | POA: Diagnosis not present

## 2016-09-13 DIAGNOSIS — I10 Essential (primary) hypertension: Secondary | ICD-10-CM | POA: Diagnosis not present

## 2016-09-16 DIAGNOSIS — Z6836 Body mass index (BMI) 36.0-36.9, adult: Secondary | ICD-10-CM | POA: Diagnosis not present

## 2016-09-16 DIAGNOSIS — E1122 Type 2 diabetes mellitus with diabetic chronic kidney disease: Secondary | ICD-10-CM | POA: Diagnosis not present

## 2016-09-16 DIAGNOSIS — I10 Essential (primary) hypertension: Secondary | ICD-10-CM | POA: Diagnosis not present

## 2016-09-16 DIAGNOSIS — J069 Acute upper respiratory infection, unspecified: Secondary | ICD-10-CM | POA: Diagnosis not present

## 2016-09-16 DIAGNOSIS — Z299 Encounter for prophylactic measures, unspecified: Secondary | ICD-10-CM | POA: Diagnosis not present

## 2016-09-16 DIAGNOSIS — I4891 Unspecified atrial fibrillation: Secondary | ICD-10-CM | POA: Diagnosis not present

## 2016-09-20 DIAGNOSIS — Z87891 Personal history of nicotine dependence: Secondary | ICD-10-CM | POA: Diagnosis not present

## 2016-09-20 DIAGNOSIS — Z6836 Body mass index (BMI) 36.0-36.9, adult: Secondary | ICD-10-CM | POA: Diagnosis not present

## 2016-09-20 DIAGNOSIS — N182 Chronic kidney disease, stage 2 (mild): Secondary | ICD-10-CM | POA: Diagnosis not present

## 2016-09-20 DIAGNOSIS — E1122 Type 2 diabetes mellitus with diabetic chronic kidney disease: Secondary | ICD-10-CM | POA: Diagnosis not present

## 2016-09-20 DIAGNOSIS — I10 Essential (primary) hypertension: Secondary | ICD-10-CM | POA: Diagnosis not present

## 2016-09-20 DIAGNOSIS — J069 Acute upper respiratory infection, unspecified: Secondary | ICD-10-CM | POA: Diagnosis not present

## 2016-09-20 DIAGNOSIS — Z299 Encounter for prophylactic measures, unspecified: Secondary | ICD-10-CM | POA: Diagnosis not present

## 2016-10-08 DIAGNOSIS — Z1211 Encounter for screening for malignant neoplasm of colon: Secondary | ICD-10-CM | POA: Diagnosis not present

## 2016-10-08 DIAGNOSIS — Z6836 Body mass index (BMI) 36.0-36.9, adult: Secondary | ICD-10-CM | POA: Diagnosis not present

## 2016-10-08 DIAGNOSIS — E7800 Pure hypercholesterolemia, unspecified: Secondary | ICD-10-CM | POA: Diagnosis not present

## 2016-10-08 DIAGNOSIS — Z0000 Encounter for general adult medical examination without abnormal findings: Secondary | ICD-10-CM | POA: Diagnosis not present

## 2016-10-08 DIAGNOSIS — Z79899 Other long term (current) drug therapy: Secondary | ICD-10-CM | POA: Diagnosis not present

## 2016-10-08 DIAGNOSIS — Z1389 Encounter for screening for other disorder: Secondary | ICD-10-CM | POA: Diagnosis not present

## 2016-10-08 DIAGNOSIS — Z299 Encounter for prophylactic measures, unspecified: Secondary | ICD-10-CM | POA: Diagnosis not present

## 2016-10-08 DIAGNOSIS — I4891 Unspecified atrial fibrillation: Secondary | ICD-10-CM | POA: Diagnosis not present

## 2016-10-08 DIAGNOSIS — Z7189 Other specified counseling: Secondary | ICD-10-CM | POA: Diagnosis not present

## 2016-11-08 DIAGNOSIS — R05 Cough: Secondary | ICD-10-CM | POA: Diagnosis not present

## 2016-11-08 DIAGNOSIS — I4891 Unspecified atrial fibrillation: Secondary | ICD-10-CM | POA: Diagnosis not present

## 2016-11-08 DIAGNOSIS — R0602 Shortness of breath: Secondary | ICD-10-CM | POA: Diagnosis not present

## 2016-11-08 DIAGNOSIS — J069 Acute upper respiratory infection, unspecified: Secondary | ICD-10-CM | POA: Diagnosis not present

## 2016-11-08 DIAGNOSIS — R062 Wheezing: Secondary | ICD-10-CM | POA: Diagnosis not present

## 2016-11-11 DIAGNOSIS — M159 Polyosteoarthritis, unspecified: Secondary | ICD-10-CM | POA: Diagnosis not present

## 2016-11-11 DIAGNOSIS — E119 Type 2 diabetes mellitus without complications: Secondary | ICD-10-CM | POA: Diagnosis not present

## 2016-11-11 DIAGNOSIS — I10 Essential (primary) hypertension: Secondary | ICD-10-CM | POA: Diagnosis not present

## 2016-11-11 DIAGNOSIS — I2510 Atherosclerotic heart disease of native coronary artery without angina pectoris: Secondary | ICD-10-CM | POA: Diagnosis not present

## 2016-12-01 DIAGNOSIS — I2510 Atherosclerotic heart disease of native coronary artery without angina pectoris: Secondary | ICD-10-CM | POA: Diagnosis not present

## 2016-12-01 DIAGNOSIS — E119 Type 2 diabetes mellitus without complications: Secondary | ICD-10-CM | POA: Diagnosis not present

## 2016-12-01 DIAGNOSIS — M159 Polyosteoarthritis, unspecified: Secondary | ICD-10-CM | POA: Diagnosis not present

## 2016-12-01 DIAGNOSIS — I10 Essential (primary) hypertension: Secondary | ICD-10-CM | POA: Diagnosis not present

## 2016-12-13 DIAGNOSIS — I4891 Unspecified atrial fibrillation: Secondary | ICD-10-CM | POA: Diagnosis not present

## 2016-12-13 DIAGNOSIS — M79604 Pain in right leg: Secondary | ICD-10-CM | POA: Diagnosis not present

## 2016-12-13 DIAGNOSIS — M25551 Pain in right hip: Secondary | ICD-10-CM | POA: Diagnosis not present

## 2016-12-13 DIAGNOSIS — M79651 Pain in right thigh: Secondary | ICD-10-CM | POA: Diagnosis not present

## 2016-12-13 DIAGNOSIS — Z713 Dietary counseling and surveillance: Secondary | ICD-10-CM | POA: Diagnosis not present

## 2016-12-13 DIAGNOSIS — E1122 Type 2 diabetes mellitus with diabetic chronic kidney disease: Secondary | ICD-10-CM | POA: Diagnosis not present

## 2016-12-13 DIAGNOSIS — J479 Bronchiectasis, uncomplicated: Secondary | ICD-10-CM | POA: Diagnosis not present

## 2016-12-13 DIAGNOSIS — N182 Chronic kidney disease, stage 2 (mild): Secondary | ICD-10-CM | POA: Diagnosis not present

## 2016-12-13 DIAGNOSIS — M79606 Pain in leg, unspecified: Secondary | ICD-10-CM | POA: Diagnosis not present

## 2016-12-13 DIAGNOSIS — Z299 Encounter for prophylactic measures, unspecified: Secondary | ICD-10-CM | POA: Diagnosis not present

## 2016-12-13 DIAGNOSIS — J449 Chronic obstructive pulmonary disease, unspecified: Secondary | ICD-10-CM | POA: Diagnosis not present

## 2016-12-13 DIAGNOSIS — Z6835 Body mass index (BMI) 35.0-35.9, adult: Secondary | ICD-10-CM | POA: Diagnosis not present

## 2016-12-13 DIAGNOSIS — Z96641 Presence of right artificial hip joint: Secondary | ICD-10-CM | POA: Diagnosis not present

## 2016-12-20 DIAGNOSIS — I10 Essential (primary) hypertension: Secondary | ICD-10-CM | POA: Diagnosis not present

## 2016-12-20 DIAGNOSIS — R509 Fever, unspecified: Secondary | ICD-10-CM | POA: Diagnosis not present

## 2016-12-20 DIAGNOSIS — I4891 Unspecified atrial fibrillation: Secondary | ICD-10-CM | POA: Diagnosis not present

## 2016-12-20 DIAGNOSIS — N182 Chronic kidney disease, stage 2 (mild): Secondary | ICD-10-CM | POA: Diagnosis not present

## 2016-12-20 DIAGNOSIS — Z299 Encounter for prophylactic measures, unspecified: Secondary | ICD-10-CM | POA: Diagnosis not present

## 2016-12-20 DIAGNOSIS — Z713 Dietary counseling and surveillance: Secondary | ICD-10-CM | POA: Diagnosis not present

## 2016-12-20 DIAGNOSIS — J111 Influenza due to unidentified influenza virus with other respiratory manifestations: Secondary | ICD-10-CM | POA: Diagnosis not present

## 2016-12-20 DIAGNOSIS — Z6835 Body mass index (BMI) 35.0-35.9, adult: Secondary | ICD-10-CM | POA: Diagnosis not present

## 2016-12-20 DIAGNOSIS — E1122 Type 2 diabetes mellitus with diabetic chronic kidney disease: Secondary | ICD-10-CM | POA: Diagnosis not present

## 2016-12-28 DIAGNOSIS — I2510 Atherosclerotic heart disease of native coronary artery without angina pectoris: Secondary | ICD-10-CM | POA: Diagnosis not present

## 2016-12-28 DIAGNOSIS — I10 Essential (primary) hypertension: Secondary | ICD-10-CM | POA: Diagnosis not present

## 2016-12-28 DIAGNOSIS — M159 Polyosteoarthritis, unspecified: Secondary | ICD-10-CM | POA: Diagnosis not present

## 2016-12-28 DIAGNOSIS — E119 Type 2 diabetes mellitus without complications: Secondary | ICD-10-CM | POA: Diagnosis not present

## 2016-12-29 DIAGNOSIS — I10 Essential (primary) hypertension: Secondary | ICD-10-CM | POA: Diagnosis not present

## 2016-12-29 DIAGNOSIS — Z299 Encounter for prophylactic measures, unspecified: Secondary | ICD-10-CM | POA: Diagnosis not present

## 2016-12-29 DIAGNOSIS — I4891 Unspecified atrial fibrillation: Secondary | ICD-10-CM | POA: Diagnosis not present

## 2016-12-29 DIAGNOSIS — R634 Abnormal weight loss: Secondary | ICD-10-CM | POA: Diagnosis not present

## 2016-12-29 DIAGNOSIS — Z6833 Body mass index (BMI) 33.0-33.9, adult: Secondary | ICD-10-CM | POA: Diagnosis not present

## 2016-12-29 DIAGNOSIS — E1122 Type 2 diabetes mellitus with diabetic chronic kidney disease: Secondary | ICD-10-CM | POA: Diagnosis not present

## 2016-12-29 DIAGNOSIS — N182 Chronic kidney disease, stage 2 (mild): Secondary | ICD-10-CM | POA: Diagnosis not present

## 2016-12-29 DIAGNOSIS — Z713 Dietary counseling and surveillance: Secondary | ICD-10-CM | POA: Diagnosis not present

## 2017-01-12 DIAGNOSIS — N182 Chronic kidney disease, stage 2 (mild): Secondary | ICD-10-CM | POA: Diagnosis not present

## 2017-01-12 DIAGNOSIS — Z299 Encounter for prophylactic measures, unspecified: Secondary | ICD-10-CM | POA: Diagnosis not present

## 2017-01-12 DIAGNOSIS — Z713 Dietary counseling and surveillance: Secondary | ICD-10-CM | POA: Diagnosis not present

## 2017-01-12 DIAGNOSIS — E7800 Pure hypercholesterolemia, unspecified: Secondary | ICD-10-CM | POA: Diagnosis not present

## 2017-01-12 DIAGNOSIS — I4891 Unspecified atrial fibrillation: Secondary | ICD-10-CM | POA: Diagnosis not present

## 2017-01-12 DIAGNOSIS — B354 Tinea corporis: Secondary | ICD-10-CM | POA: Diagnosis not present

## 2017-01-12 DIAGNOSIS — Z6833 Body mass index (BMI) 33.0-33.9, adult: Secondary | ICD-10-CM | POA: Diagnosis not present

## 2017-01-12 DIAGNOSIS — E1122 Type 2 diabetes mellitus with diabetic chronic kidney disease: Secondary | ICD-10-CM | POA: Diagnosis not present

## 2017-01-12 DIAGNOSIS — I10 Essential (primary) hypertension: Secondary | ICD-10-CM | POA: Diagnosis not present

## 2017-02-09 DIAGNOSIS — N400 Enlarged prostate without lower urinary tract symptoms: Secondary | ICD-10-CM | POA: Diagnosis not present

## 2017-02-09 DIAGNOSIS — Z6833 Body mass index (BMI) 33.0-33.9, adult: Secondary | ICD-10-CM | POA: Diagnosis not present

## 2017-02-09 DIAGNOSIS — Z299 Encounter for prophylactic measures, unspecified: Secondary | ICD-10-CM | POA: Diagnosis not present

## 2017-02-09 DIAGNOSIS — I10 Essential (primary) hypertension: Secondary | ICD-10-CM | POA: Diagnosis not present

## 2017-02-09 DIAGNOSIS — E1122 Type 2 diabetes mellitus with diabetic chronic kidney disease: Secondary | ICD-10-CM | POA: Diagnosis not present

## 2017-02-09 DIAGNOSIS — K219 Gastro-esophageal reflux disease without esophagitis: Secondary | ICD-10-CM | POA: Diagnosis not present

## 2017-02-09 DIAGNOSIS — E7800 Pure hypercholesterolemia, unspecified: Secondary | ICD-10-CM | POA: Diagnosis not present

## 2017-02-09 DIAGNOSIS — J479 Bronchiectasis, uncomplicated: Secondary | ICD-10-CM | POA: Diagnosis not present

## 2017-02-09 DIAGNOSIS — I4891 Unspecified atrial fibrillation: Secondary | ICD-10-CM | POA: Diagnosis not present

## 2017-02-09 DIAGNOSIS — Z789 Other specified health status: Secondary | ICD-10-CM | POA: Diagnosis not present

## 2017-02-09 DIAGNOSIS — N182 Chronic kidney disease, stage 2 (mild): Secondary | ICD-10-CM | POA: Diagnosis not present

## 2017-02-09 DIAGNOSIS — J449 Chronic obstructive pulmonary disease, unspecified: Secondary | ICD-10-CM | POA: Diagnosis not present

## 2017-02-14 DIAGNOSIS — I2510 Atherosclerotic heart disease of native coronary artery without angina pectoris: Secondary | ICD-10-CM | POA: Diagnosis not present

## 2017-02-14 DIAGNOSIS — E119 Type 2 diabetes mellitus without complications: Secondary | ICD-10-CM | POA: Diagnosis not present

## 2017-02-14 DIAGNOSIS — I10 Essential (primary) hypertension: Secondary | ICD-10-CM | POA: Diagnosis not present

## 2017-02-14 DIAGNOSIS — M159 Polyosteoarthritis, unspecified: Secondary | ICD-10-CM | POA: Diagnosis not present

## 2017-02-23 DIAGNOSIS — G4733 Obstructive sleep apnea (adult) (pediatric): Secondary | ICD-10-CM | POA: Diagnosis not present

## 2017-02-23 DIAGNOSIS — J449 Chronic obstructive pulmonary disease, unspecified: Secondary | ICD-10-CM | POA: Diagnosis not present

## 2017-02-23 DIAGNOSIS — Z299 Encounter for prophylactic measures, unspecified: Secondary | ICD-10-CM | POA: Diagnosis not present

## 2017-02-23 DIAGNOSIS — I10 Essential (primary) hypertension: Secondary | ICD-10-CM | POA: Diagnosis not present

## 2017-02-23 DIAGNOSIS — I4891 Unspecified atrial fibrillation: Secondary | ICD-10-CM | POA: Diagnosis not present

## 2017-02-23 DIAGNOSIS — I2510 Atherosclerotic heart disease of native coronary artery without angina pectoris: Secondary | ICD-10-CM | POA: Diagnosis not present

## 2017-02-23 DIAGNOSIS — E1165 Type 2 diabetes mellitus with hyperglycemia: Secondary | ICD-10-CM | POA: Diagnosis not present

## 2017-02-23 DIAGNOSIS — B354 Tinea corporis: Secondary | ICD-10-CM | POA: Diagnosis not present

## 2017-02-23 DIAGNOSIS — Z6833 Body mass index (BMI) 33.0-33.9, adult: Secondary | ICD-10-CM | POA: Diagnosis not present

## 2017-03-10 DIAGNOSIS — I10 Essential (primary) hypertension: Secondary | ICD-10-CM | POA: Diagnosis not present

## 2017-03-10 DIAGNOSIS — Z299 Encounter for prophylactic measures, unspecified: Secondary | ICD-10-CM | POA: Diagnosis not present

## 2017-03-10 DIAGNOSIS — J449 Chronic obstructive pulmonary disease, unspecified: Secondary | ICD-10-CM | POA: Diagnosis not present

## 2017-03-10 DIAGNOSIS — E7800 Pure hypercholesterolemia, unspecified: Secondary | ICD-10-CM | POA: Diagnosis not present

## 2017-03-10 DIAGNOSIS — I2510 Atherosclerotic heart disease of native coronary artery without angina pectoris: Secondary | ICD-10-CM | POA: Diagnosis not present

## 2017-03-10 DIAGNOSIS — E1165 Type 2 diabetes mellitus with hyperglycemia: Secondary | ICD-10-CM | POA: Diagnosis not present

## 2017-03-10 DIAGNOSIS — I4891 Unspecified atrial fibrillation: Secondary | ICD-10-CM | POA: Diagnosis not present

## 2017-03-10 DIAGNOSIS — M7662 Achilles tendinitis, left leg: Secondary | ICD-10-CM | POA: Diagnosis not present

## 2017-03-10 DIAGNOSIS — Z6833 Body mass index (BMI) 33.0-33.9, adult: Secondary | ICD-10-CM | POA: Diagnosis not present

## 2017-03-16 DIAGNOSIS — I2510 Atherosclerotic heart disease of native coronary artery without angina pectoris: Secondary | ICD-10-CM | POA: Diagnosis not present

## 2017-03-16 DIAGNOSIS — M159 Polyosteoarthritis, unspecified: Secondary | ICD-10-CM | POA: Diagnosis not present

## 2017-03-16 DIAGNOSIS — I10 Essential (primary) hypertension: Secondary | ICD-10-CM | POA: Diagnosis not present

## 2017-03-16 DIAGNOSIS — E119 Type 2 diabetes mellitus without complications: Secondary | ICD-10-CM | POA: Diagnosis not present

## 2017-03-23 DIAGNOSIS — I10 Essential (primary) hypertension: Secondary | ICD-10-CM | POA: Diagnosis not present

## 2017-03-23 DIAGNOSIS — J479 Bronchiectasis, uncomplicated: Secondary | ICD-10-CM | POA: Diagnosis not present

## 2017-03-23 DIAGNOSIS — K219 Gastro-esophageal reflux disease without esophagitis: Secondary | ICD-10-CM | POA: Diagnosis not present

## 2017-03-23 DIAGNOSIS — E1165 Type 2 diabetes mellitus with hyperglycemia: Secondary | ICD-10-CM | POA: Diagnosis not present

## 2017-03-23 DIAGNOSIS — I4891 Unspecified atrial fibrillation: Secondary | ICD-10-CM | POA: Diagnosis not present

## 2017-03-23 DIAGNOSIS — N400 Enlarged prostate without lower urinary tract symptoms: Secondary | ICD-10-CM | POA: Diagnosis not present

## 2017-03-23 DIAGNOSIS — G4733 Obstructive sleep apnea (adult) (pediatric): Secondary | ICD-10-CM | POA: Diagnosis not present

## 2017-03-23 DIAGNOSIS — J449 Chronic obstructive pulmonary disease, unspecified: Secondary | ICD-10-CM | POA: Diagnosis not present

## 2017-03-23 DIAGNOSIS — E7800 Pure hypercholesterolemia, unspecified: Secondary | ICD-10-CM | POA: Diagnosis not present

## 2017-03-23 DIAGNOSIS — Z299 Encounter for prophylactic measures, unspecified: Secondary | ICD-10-CM | POA: Diagnosis not present

## 2017-03-23 DIAGNOSIS — I2510 Atherosclerotic heart disease of native coronary artery without angina pectoris: Secondary | ICD-10-CM | POA: Diagnosis not present

## 2017-03-23 DIAGNOSIS — Z6833 Body mass index (BMI) 33.0-33.9, adult: Secondary | ICD-10-CM | POA: Diagnosis not present

## 2017-03-30 DIAGNOSIS — E1151 Type 2 diabetes mellitus with diabetic peripheral angiopathy without gangrene: Secondary | ICD-10-CM | POA: Diagnosis not present

## 2017-03-30 DIAGNOSIS — L110 Acquired keratosis follicularis: Secondary | ICD-10-CM | POA: Diagnosis not present

## 2017-03-30 DIAGNOSIS — E1140 Type 2 diabetes mellitus with diabetic neuropathy, unspecified: Secondary | ICD-10-CM | POA: Diagnosis not present

## 2017-03-30 DIAGNOSIS — B351 Tinea unguium: Secondary | ICD-10-CM | POA: Diagnosis not present

## 2017-03-31 DIAGNOSIS — I10 Essential (primary) hypertension: Secondary | ICD-10-CM | POA: Diagnosis not present

## 2017-03-31 DIAGNOSIS — I2510 Atherosclerotic heart disease of native coronary artery without angina pectoris: Secondary | ICD-10-CM | POA: Diagnosis not present

## 2017-03-31 DIAGNOSIS — J069 Acute upper respiratory infection, unspecified: Secondary | ICD-10-CM | POA: Diagnosis not present

## 2017-03-31 DIAGNOSIS — Z6833 Body mass index (BMI) 33.0-33.9, adult: Secondary | ICD-10-CM | POA: Diagnosis not present

## 2017-03-31 DIAGNOSIS — Z299 Encounter for prophylactic measures, unspecified: Secondary | ICD-10-CM | POA: Diagnosis not present

## 2017-03-31 DIAGNOSIS — E7800 Pure hypercholesterolemia, unspecified: Secondary | ICD-10-CM | POA: Diagnosis not present

## 2017-03-31 DIAGNOSIS — I4891 Unspecified atrial fibrillation: Secondary | ICD-10-CM | POA: Diagnosis not present

## 2017-04-27 DIAGNOSIS — J449 Chronic obstructive pulmonary disease, unspecified: Secondary | ICD-10-CM | POA: Diagnosis not present

## 2017-04-27 DIAGNOSIS — Z299 Encounter for prophylactic measures, unspecified: Secondary | ICD-10-CM | POA: Diagnosis not present

## 2017-04-27 DIAGNOSIS — J069 Acute upper respiratory infection, unspecified: Secondary | ICD-10-CM | POA: Diagnosis not present

## 2017-04-27 DIAGNOSIS — Z6833 Body mass index (BMI) 33.0-33.9, adult: Secondary | ICD-10-CM | POA: Diagnosis not present

## 2017-04-27 DIAGNOSIS — J479 Bronchiectasis, uncomplicated: Secondary | ICD-10-CM | POA: Diagnosis not present

## 2017-04-27 DIAGNOSIS — I4891 Unspecified atrial fibrillation: Secondary | ICD-10-CM | POA: Diagnosis not present

## 2017-05-16 DIAGNOSIS — Z713 Dietary counseling and surveillance: Secondary | ICD-10-CM | POA: Diagnosis not present

## 2017-05-16 DIAGNOSIS — J449 Chronic obstructive pulmonary disease, unspecified: Secondary | ICD-10-CM | POA: Diagnosis not present

## 2017-05-16 DIAGNOSIS — E7800 Pure hypercholesterolemia, unspecified: Secondary | ICD-10-CM | POA: Diagnosis not present

## 2017-05-16 DIAGNOSIS — Z299 Encounter for prophylactic measures, unspecified: Secondary | ICD-10-CM | POA: Diagnosis not present

## 2017-05-16 DIAGNOSIS — Z6833 Body mass index (BMI) 33.0-33.9, adult: Secondary | ICD-10-CM | POA: Diagnosis not present

## 2017-05-16 DIAGNOSIS — I10 Essential (primary) hypertension: Secondary | ICD-10-CM | POA: Diagnosis not present

## 2017-05-16 DIAGNOSIS — I4891 Unspecified atrial fibrillation: Secondary | ICD-10-CM | POA: Diagnosis not present

## 2017-05-16 DIAGNOSIS — N400 Enlarged prostate without lower urinary tract symptoms: Secondary | ICD-10-CM | POA: Diagnosis not present

## 2017-05-16 DIAGNOSIS — I2510 Atherosclerotic heart disease of native coronary artery without angina pectoris: Secondary | ICD-10-CM | POA: Diagnosis not present

## 2017-05-16 DIAGNOSIS — Z789 Other specified health status: Secondary | ICD-10-CM | POA: Diagnosis not present

## 2017-05-16 DIAGNOSIS — J069 Acute upper respiratory infection, unspecified: Secondary | ICD-10-CM | POA: Diagnosis not present

## 2017-06-08 DIAGNOSIS — Z6833 Body mass index (BMI) 33.0-33.9, adult: Secondary | ICD-10-CM | POA: Diagnosis not present

## 2017-06-08 DIAGNOSIS — I4891 Unspecified atrial fibrillation: Secondary | ICD-10-CM | POA: Diagnosis not present

## 2017-06-08 DIAGNOSIS — E1165 Type 2 diabetes mellitus with hyperglycemia: Secondary | ICD-10-CM | POA: Diagnosis not present

## 2017-06-08 DIAGNOSIS — J449 Chronic obstructive pulmonary disease, unspecified: Secondary | ICD-10-CM | POA: Diagnosis not present

## 2017-06-08 DIAGNOSIS — K219 Gastro-esophageal reflux disease without esophagitis: Secondary | ICD-10-CM | POA: Diagnosis not present

## 2017-06-08 DIAGNOSIS — E7800 Pure hypercholesterolemia, unspecified: Secondary | ICD-10-CM | POA: Diagnosis not present

## 2017-06-08 DIAGNOSIS — Z299 Encounter for prophylactic measures, unspecified: Secondary | ICD-10-CM | POA: Diagnosis not present

## 2017-06-08 DIAGNOSIS — R05 Cough: Secondary | ICD-10-CM | POA: Diagnosis not present

## 2017-06-08 DIAGNOSIS — I10 Essential (primary) hypertension: Secondary | ICD-10-CM | POA: Diagnosis not present

## 2017-06-22 DIAGNOSIS — E1140 Type 2 diabetes mellitus with diabetic neuropathy, unspecified: Secondary | ICD-10-CM | POA: Diagnosis not present

## 2017-06-22 DIAGNOSIS — B351 Tinea unguium: Secondary | ICD-10-CM | POA: Diagnosis not present

## 2017-06-22 DIAGNOSIS — L110 Acquired keratosis follicularis: Secondary | ICD-10-CM | POA: Diagnosis not present

## 2017-06-22 DIAGNOSIS — E1151 Type 2 diabetes mellitus with diabetic peripheral angiopathy without gangrene: Secondary | ICD-10-CM | POA: Diagnosis not present

## 2017-06-23 DIAGNOSIS — I2510 Atherosclerotic heart disease of native coronary artery without angina pectoris: Secondary | ICD-10-CM | POA: Diagnosis not present

## 2017-06-23 DIAGNOSIS — I10 Essential (primary) hypertension: Secondary | ICD-10-CM | POA: Diagnosis not present

## 2017-06-23 DIAGNOSIS — E119 Type 2 diabetes mellitus without complications: Secondary | ICD-10-CM | POA: Diagnosis not present

## 2017-06-23 DIAGNOSIS — M159 Polyosteoarthritis, unspecified: Secondary | ICD-10-CM | POA: Diagnosis not present

## 2017-07-04 DIAGNOSIS — L821 Other seborrheic keratosis: Secondary | ICD-10-CM | POA: Diagnosis not present

## 2017-07-04 DIAGNOSIS — L570 Actinic keratosis: Secondary | ICD-10-CM | POA: Diagnosis not present

## 2017-07-04 DIAGNOSIS — D485 Neoplasm of uncertain behavior of skin: Secondary | ICD-10-CM | POA: Diagnosis not present

## 2017-07-06 DIAGNOSIS — E7800 Pure hypercholesterolemia, unspecified: Secondary | ICD-10-CM | POA: Diagnosis not present

## 2017-07-06 DIAGNOSIS — Z6833 Body mass index (BMI) 33.0-33.9, adult: Secondary | ICD-10-CM | POA: Diagnosis not present

## 2017-07-06 DIAGNOSIS — Z713 Dietary counseling and surveillance: Secondary | ICD-10-CM | POA: Diagnosis not present

## 2017-07-06 DIAGNOSIS — I872 Venous insufficiency (chronic) (peripheral): Secondary | ICD-10-CM | POA: Diagnosis not present

## 2017-07-06 DIAGNOSIS — E1165 Type 2 diabetes mellitus with hyperglycemia: Secondary | ICD-10-CM | POA: Diagnosis not present

## 2017-07-06 DIAGNOSIS — S80829A Blister (nonthermal), unspecified lower leg, initial encounter: Secondary | ICD-10-CM | POA: Diagnosis not present

## 2017-07-06 DIAGNOSIS — Z299 Encounter for prophylactic measures, unspecified: Secondary | ICD-10-CM | POA: Diagnosis not present

## 2017-07-06 DIAGNOSIS — I10 Essential (primary) hypertension: Secondary | ICD-10-CM | POA: Diagnosis not present

## 2017-07-06 DIAGNOSIS — I4891 Unspecified atrial fibrillation: Secondary | ICD-10-CM | POA: Diagnosis not present

## 2017-07-19 DIAGNOSIS — E1165 Type 2 diabetes mellitus with hyperglycemia: Secondary | ICD-10-CM | POA: Diagnosis not present

## 2017-07-19 DIAGNOSIS — Z23 Encounter for immunization: Secondary | ICD-10-CM | POA: Diagnosis not present

## 2017-07-19 DIAGNOSIS — R600 Localized edema: Secondary | ICD-10-CM | POA: Diagnosis not present

## 2017-07-19 DIAGNOSIS — Z713 Dietary counseling and surveillance: Secondary | ICD-10-CM | POA: Diagnosis not present

## 2017-07-19 DIAGNOSIS — T148XXA Other injury of unspecified body region, initial encounter: Secondary | ICD-10-CM | POA: Diagnosis not present

## 2017-07-19 DIAGNOSIS — Z299 Encounter for prophylactic measures, unspecified: Secondary | ICD-10-CM | POA: Diagnosis not present

## 2017-07-19 DIAGNOSIS — Z6833 Body mass index (BMI) 33.0-33.9, adult: Secondary | ICD-10-CM | POA: Diagnosis not present

## 2017-07-19 DIAGNOSIS — I10 Essential (primary) hypertension: Secondary | ICD-10-CM | POA: Diagnosis not present

## 2017-07-19 DIAGNOSIS — J449 Chronic obstructive pulmonary disease, unspecified: Secondary | ICD-10-CM | POA: Diagnosis not present

## 2017-07-19 DIAGNOSIS — I4891 Unspecified atrial fibrillation: Secondary | ICD-10-CM | POA: Diagnosis not present

## 2017-07-21 DIAGNOSIS — I2510 Atherosclerotic heart disease of native coronary artery without angina pectoris: Secondary | ICD-10-CM | POA: Diagnosis not present

## 2017-07-21 DIAGNOSIS — J449 Chronic obstructive pulmonary disease, unspecified: Secondary | ICD-10-CM | POA: Diagnosis not present

## 2017-07-21 DIAGNOSIS — I10 Essential (primary) hypertension: Secondary | ICD-10-CM | POA: Diagnosis not present

## 2017-07-21 DIAGNOSIS — I4891 Unspecified atrial fibrillation: Secondary | ICD-10-CM | POA: Diagnosis not present

## 2017-07-21 DIAGNOSIS — Z713 Dietary counseling and surveillance: Secondary | ICD-10-CM | POA: Diagnosis not present

## 2017-07-21 DIAGNOSIS — T148XXA Other injury of unspecified body region, initial encounter: Secondary | ICD-10-CM | POA: Diagnosis not present

## 2017-07-21 DIAGNOSIS — Z299 Encounter for prophylactic measures, unspecified: Secondary | ICD-10-CM | POA: Diagnosis not present

## 2017-07-21 DIAGNOSIS — K219 Gastro-esophageal reflux disease without esophagitis: Secondary | ICD-10-CM | POA: Diagnosis not present

## 2017-07-21 DIAGNOSIS — Z6833 Body mass index (BMI) 33.0-33.9, adult: Secondary | ICD-10-CM | POA: Diagnosis not present

## 2017-07-22 DIAGNOSIS — E119 Type 2 diabetes mellitus without complications: Secondary | ICD-10-CM | POA: Diagnosis not present

## 2017-07-22 DIAGNOSIS — I2510 Atherosclerotic heart disease of native coronary artery without angina pectoris: Secondary | ICD-10-CM | POA: Diagnosis not present

## 2017-07-22 DIAGNOSIS — I10 Essential (primary) hypertension: Secondary | ICD-10-CM | POA: Diagnosis not present

## 2017-07-22 DIAGNOSIS — M159 Polyosteoarthritis, unspecified: Secondary | ICD-10-CM | POA: Diagnosis not present

## 2017-07-28 DIAGNOSIS — M79671 Pain in right foot: Secondary | ICD-10-CM | POA: Diagnosis not present

## 2017-07-28 DIAGNOSIS — M79672 Pain in left foot: Secondary | ICD-10-CM | POA: Diagnosis not present

## 2017-07-28 DIAGNOSIS — B353 Tinea pedis: Secondary | ICD-10-CM | POA: Diagnosis not present

## 2017-08-18 DIAGNOSIS — S90111A Contusion of right great toe without damage to nail, initial encounter: Secondary | ICD-10-CM | POA: Diagnosis not present

## 2017-08-18 DIAGNOSIS — S90112A Contusion of left great toe without damage to nail, initial encounter: Secondary | ICD-10-CM | POA: Diagnosis not present

## 2017-08-18 DIAGNOSIS — M79674 Pain in right toe(s): Secondary | ICD-10-CM | POA: Diagnosis not present

## 2017-08-18 DIAGNOSIS — M79675 Pain in left toe(s): Secondary | ICD-10-CM | POA: Diagnosis not present

## 2017-08-26 DIAGNOSIS — I872 Venous insufficiency (chronic) (peripheral): Secondary | ICD-10-CM | POA: Diagnosis not present

## 2017-08-26 DIAGNOSIS — J449 Chronic obstructive pulmonary disease, unspecified: Secondary | ICD-10-CM | POA: Diagnosis not present

## 2017-08-26 DIAGNOSIS — Z87891 Personal history of nicotine dependence: Secondary | ICD-10-CM | POA: Diagnosis not present

## 2017-08-26 DIAGNOSIS — I4891 Unspecified atrial fibrillation: Secondary | ICD-10-CM | POA: Diagnosis not present

## 2017-08-26 DIAGNOSIS — Z6833 Body mass index (BMI) 33.0-33.9, adult: Secondary | ICD-10-CM | POA: Diagnosis not present

## 2017-08-26 DIAGNOSIS — E7800 Pure hypercholesterolemia, unspecified: Secondary | ICD-10-CM | POA: Diagnosis not present

## 2017-08-26 DIAGNOSIS — I10 Essential (primary) hypertension: Secondary | ICD-10-CM | POA: Diagnosis not present

## 2017-08-26 DIAGNOSIS — Z299 Encounter for prophylactic measures, unspecified: Secondary | ICD-10-CM | POA: Diagnosis not present

## 2017-08-30 DIAGNOSIS — I10 Essential (primary) hypertension: Secondary | ICD-10-CM | POA: Diagnosis not present

## 2017-08-30 DIAGNOSIS — M159 Polyosteoarthritis, unspecified: Secondary | ICD-10-CM | POA: Diagnosis not present

## 2017-08-30 DIAGNOSIS — I2510 Atherosclerotic heart disease of native coronary artery without angina pectoris: Secondary | ICD-10-CM | POA: Diagnosis not present

## 2017-08-30 DIAGNOSIS — E119 Type 2 diabetes mellitus without complications: Secondary | ICD-10-CM | POA: Diagnosis not present

## 2017-09-14 DIAGNOSIS — B351 Tinea unguium: Secondary | ICD-10-CM | POA: Diagnosis not present

## 2017-09-14 DIAGNOSIS — E1140 Type 2 diabetes mellitus with diabetic neuropathy, unspecified: Secondary | ICD-10-CM | POA: Diagnosis not present

## 2017-09-14 DIAGNOSIS — E1151 Type 2 diabetes mellitus with diabetic peripheral angiopathy without gangrene: Secondary | ICD-10-CM | POA: Diagnosis not present

## 2017-09-14 DIAGNOSIS — L110 Acquired keratosis follicularis: Secondary | ICD-10-CM | POA: Diagnosis not present

## 2017-09-15 DIAGNOSIS — I2510 Atherosclerotic heart disease of native coronary artery without angina pectoris: Secondary | ICD-10-CM | POA: Diagnosis not present

## 2017-09-15 DIAGNOSIS — Z6833 Body mass index (BMI) 33.0-33.9, adult: Secondary | ICD-10-CM | POA: Diagnosis not present

## 2017-09-15 DIAGNOSIS — Z299 Encounter for prophylactic measures, unspecified: Secondary | ICD-10-CM | POA: Diagnosis not present

## 2017-09-15 DIAGNOSIS — J069 Acute upper respiratory infection, unspecified: Secondary | ICD-10-CM | POA: Diagnosis not present

## 2017-09-15 DIAGNOSIS — E1165 Type 2 diabetes mellitus with hyperglycemia: Secondary | ICD-10-CM | POA: Diagnosis not present

## 2017-09-15 DIAGNOSIS — I4891 Unspecified atrial fibrillation: Secondary | ICD-10-CM | POA: Diagnosis not present

## 2017-09-29 DIAGNOSIS — E119 Type 2 diabetes mellitus without complications: Secondary | ICD-10-CM | POA: Diagnosis not present

## 2017-09-29 DIAGNOSIS — M159 Polyosteoarthritis, unspecified: Secondary | ICD-10-CM | POA: Diagnosis not present

## 2017-09-29 DIAGNOSIS — I10 Essential (primary) hypertension: Secondary | ICD-10-CM | POA: Diagnosis not present

## 2017-09-29 DIAGNOSIS — I2510 Atherosclerotic heart disease of native coronary artery without angina pectoris: Secondary | ICD-10-CM | POA: Diagnosis not present

## 2017-10-05 DIAGNOSIS — Z299 Encounter for prophylactic measures, unspecified: Secondary | ICD-10-CM | POA: Diagnosis not present

## 2017-10-05 DIAGNOSIS — I4891 Unspecified atrial fibrillation: Secondary | ICD-10-CM | POA: Diagnosis not present

## 2017-10-05 DIAGNOSIS — Z713 Dietary counseling and surveillance: Secondary | ICD-10-CM | POA: Diagnosis not present

## 2017-10-05 DIAGNOSIS — I10 Essential (primary) hypertension: Secondary | ICD-10-CM | POA: Diagnosis not present

## 2017-10-05 DIAGNOSIS — Z6835 Body mass index (BMI) 35.0-35.9, adult: Secondary | ICD-10-CM | POA: Diagnosis not present

## 2017-10-19 DIAGNOSIS — E1165 Type 2 diabetes mellitus with hyperglycemia: Secondary | ICD-10-CM | POA: Diagnosis not present

## 2017-10-19 DIAGNOSIS — E7800 Pure hypercholesterolemia, unspecified: Secondary | ICD-10-CM | POA: Diagnosis not present

## 2017-10-19 DIAGNOSIS — Z299 Encounter for prophylactic measures, unspecified: Secondary | ICD-10-CM | POA: Diagnosis not present

## 2017-10-19 DIAGNOSIS — Z125 Encounter for screening for malignant neoplasm of prostate: Secondary | ICD-10-CM | POA: Diagnosis not present

## 2017-10-19 DIAGNOSIS — R5383 Other fatigue: Secondary | ICD-10-CM | POA: Diagnosis not present

## 2017-10-19 DIAGNOSIS — Z1211 Encounter for screening for malignant neoplasm of colon: Secondary | ICD-10-CM | POA: Diagnosis not present

## 2017-10-19 DIAGNOSIS — Z0000 Encounter for general adult medical examination without abnormal findings: Secondary | ICD-10-CM | POA: Diagnosis not present

## 2017-10-19 DIAGNOSIS — Z1331 Encounter for screening for depression: Secondary | ICD-10-CM | POA: Diagnosis not present

## 2017-10-19 DIAGNOSIS — Z87891 Personal history of nicotine dependence: Secondary | ICD-10-CM | POA: Diagnosis not present

## 2017-10-19 DIAGNOSIS — Z1339 Encounter for screening examination for other mental health and behavioral disorders: Secondary | ICD-10-CM | POA: Diagnosis not present

## 2017-10-19 DIAGNOSIS — Z7189 Other specified counseling: Secondary | ICD-10-CM | POA: Diagnosis not present

## 2017-10-19 DIAGNOSIS — Z6834 Body mass index (BMI) 34.0-34.9, adult: Secondary | ICD-10-CM | POA: Diagnosis not present

## 2017-10-19 DIAGNOSIS — Z79899 Other long term (current) drug therapy: Secondary | ICD-10-CM | POA: Diagnosis not present

## 2017-11-22 DIAGNOSIS — E119 Type 2 diabetes mellitus without complications: Secondary | ICD-10-CM | POA: Diagnosis not present

## 2017-11-22 DIAGNOSIS — M159 Polyosteoarthritis, unspecified: Secondary | ICD-10-CM | POA: Diagnosis not present

## 2017-11-22 DIAGNOSIS — I2510 Atherosclerotic heart disease of native coronary artery without angina pectoris: Secondary | ICD-10-CM | POA: Diagnosis not present

## 2017-11-22 DIAGNOSIS — I10 Essential (primary) hypertension: Secondary | ICD-10-CM | POA: Diagnosis not present

## 2017-11-25 DIAGNOSIS — I4891 Unspecified atrial fibrillation: Secondary | ICD-10-CM | POA: Diagnosis not present

## 2017-11-25 DIAGNOSIS — J449 Chronic obstructive pulmonary disease, unspecified: Secondary | ICD-10-CM | POA: Diagnosis not present

## 2017-11-25 DIAGNOSIS — M549 Dorsalgia, unspecified: Secondary | ICD-10-CM | POA: Diagnosis not present

## 2017-11-25 DIAGNOSIS — Z6835 Body mass index (BMI) 35.0-35.9, adult: Secondary | ICD-10-CM | POA: Diagnosis not present

## 2017-11-25 DIAGNOSIS — Z299 Encounter for prophylactic measures, unspecified: Secondary | ICD-10-CM | POA: Diagnosis not present

## 2017-11-25 DIAGNOSIS — I10 Essential (primary) hypertension: Secondary | ICD-10-CM | POA: Diagnosis not present

## 2017-11-25 DIAGNOSIS — I2510 Atherosclerotic heart disease of native coronary artery without angina pectoris: Secondary | ICD-10-CM | POA: Diagnosis not present

## 2017-12-01 DIAGNOSIS — E1151 Type 2 diabetes mellitus with diabetic peripheral angiopathy without gangrene: Secondary | ICD-10-CM | POA: Diagnosis not present

## 2017-12-01 DIAGNOSIS — L110 Acquired keratosis follicularis: Secondary | ICD-10-CM | POA: Diagnosis not present

## 2017-12-01 DIAGNOSIS — B351 Tinea unguium: Secondary | ICD-10-CM | POA: Diagnosis not present

## 2017-12-01 DIAGNOSIS — E1140 Type 2 diabetes mellitus with diabetic neuropathy, unspecified: Secondary | ICD-10-CM | POA: Diagnosis not present

## 2017-12-02 DIAGNOSIS — I10 Essential (primary) hypertension: Secondary | ICD-10-CM | POA: Diagnosis not present

## 2017-12-02 DIAGNOSIS — J479 Bronchiectasis, uncomplicated: Secondary | ICD-10-CM | POA: Diagnosis not present

## 2017-12-02 DIAGNOSIS — J441 Chronic obstructive pulmonary disease with (acute) exacerbation: Secondary | ICD-10-CM | POA: Diagnosis not present

## 2017-12-02 DIAGNOSIS — J449 Chronic obstructive pulmonary disease, unspecified: Secondary | ICD-10-CM | POA: Diagnosis not present

## 2017-12-02 DIAGNOSIS — Z6835 Body mass index (BMI) 35.0-35.9, adult: Secondary | ICD-10-CM | POA: Diagnosis not present

## 2017-12-02 DIAGNOSIS — Z299 Encounter for prophylactic measures, unspecified: Secondary | ICD-10-CM | POA: Diagnosis not present

## 2017-12-13 DIAGNOSIS — Z6835 Body mass index (BMI) 35.0-35.9, adult: Secondary | ICD-10-CM | POA: Diagnosis not present

## 2017-12-13 DIAGNOSIS — I10 Essential (primary) hypertension: Secondary | ICD-10-CM | POA: Diagnosis not present

## 2017-12-13 DIAGNOSIS — E1165 Type 2 diabetes mellitus with hyperglycemia: Secondary | ICD-10-CM | POA: Diagnosis not present

## 2017-12-13 DIAGNOSIS — I2510 Atherosclerotic heart disease of native coronary artery without angina pectoris: Secondary | ICD-10-CM | POA: Diagnosis not present

## 2017-12-13 DIAGNOSIS — I4891 Unspecified atrial fibrillation: Secondary | ICD-10-CM | POA: Diagnosis not present

## 2017-12-13 DIAGNOSIS — J449 Chronic obstructive pulmonary disease, unspecified: Secondary | ICD-10-CM | POA: Diagnosis not present

## 2017-12-13 DIAGNOSIS — Z299 Encounter for prophylactic measures, unspecified: Secondary | ICD-10-CM | POA: Diagnosis not present

## 2017-12-20 DIAGNOSIS — M159 Polyosteoarthritis, unspecified: Secondary | ICD-10-CM | POA: Diagnosis not present

## 2017-12-20 DIAGNOSIS — I2510 Atherosclerotic heart disease of native coronary artery without angina pectoris: Secondary | ICD-10-CM | POA: Diagnosis not present

## 2017-12-20 DIAGNOSIS — E119 Type 2 diabetes mellitus without complications: Secondary | ICD-10-CM | POA: Diagnosis not present

## 2017-12-20 DIAGNOSIS — I10 Essential (primary) hypertension: Secondary | ICD-10-CM | POA: Diagnosis not present

## 2018-01-02 DIAGNOSIS — B353 Tinea pedis: Secondary | ICD-10-CM | POA: Diagnosis not present

## 2018-01-02 DIAGNOSIS — L280 Lichen simplex chronicus: Secondary | ICD-10-CM | POA: Diagnosis not present

## 2018-01-02 DIAGNOSIS — L570 Actinic keratosis: Secondary | ICD-10-CM | POA: Diagnosis not present

## 2018-02-08 DIAGNOSIS — I10 Essential (primary) hypertension: Secondary | ICD-10-CM | POA: Diagnosis not present

## 2018-02-08 DIAGNOSIS — I2510 Atherosclerotic heart disease of native coronary artery without angina pectoris: Secondary | ICD-10-CM | POA: Diagnosis not present

## 2018-02-08 DIAGNOSIS — E119 Type 2 diabetes mellitus without complications: Secondary | ICD-10-CM | POA: Diagnosis not present

## 2018-02-08 DIAGNOSIS — M159 Polyosteoarthritis, unspecified: Secondary | ICD-10-CM | POA: Diagnosis not present

## 2018-02-09 DIAGNOSIS — Z6835 Body mass index (BMI) 35.0-35.9, adult: Secondary | ICD-10-CM | POA: Diagnosis not present

## 2018-02-09 DIAGNOSIS — Z299 Encounter for prophylactic measures, unspecified: Secondary | ICD-10-CM | POA: Diagnosis not present

## 2018-02-09 DIAGNOSIS — J449 Chronic obstructive pulmonary disease, unspecified: Secondary | ICD-10-CM | POA: Diagnosis not present

## 2018-02-09 DIAGNOSIS — I4891 Unspecified atrial fibrillation: Secondary | ICD-10-CM | POA: Diagnosis not present

## 2018-02-09 DIAGNOSIS — E1165 Type 2 diabetes mellitus with hyperglycemia: Secondary | ICD-10-CM | POA: Diagnosis not present

## 2018-02-20 DIAGNOSIS — T50901A Poisoning by unspecified drugs, medicaments and biological substances, accidental (unintentional), initial encounter: Secondary | ICD-10-CM | POA: Diagnosis not present

## 2018-02-20 DIAGNOSIS — L03119 Cellulitis of unspecified part of limb: Secondary | ICD-10-CM | POA: Diagnosis not present

## 2018-02-20 DIAGNOSIS — J449 Chronic obstructive pulmonary disease, unspecified: Secondary | ICD-10-CM | POA: Diagnosis not present

## 2018-02-20 DIAGNOSIS — T364X1A Poisoning by tetracyclines, accidental (unintentional), initial encounter: Secondary | ICD-10-CM | POA: Diagnosis not present

## 2018-02-20 DIAGNOSIS — M1990 Unspecified osteoarthritis, unspecified site: Secondary | ICD-10-CM | POA: Diagnosis not present

## 2018-02-20 DIAGNOSIS — Z7901 Long term (current) use of anticoagulants: Secondary | ICD-10-CM | POA: Diagnosis not present

## 2018-02-20 DIAGNOSIS — Z7984 Long term (current) use of oral hypoglycemic drugs: Secondary | ICD-10-CM | POA: Diagnosis not present

## 2018-02-20 DIAGNOSIS — I10 Essential (primary) hypertension: Secondary | ICD-10-CM | POA: Diagnosis not present

## 2018-02-20 DIAGNOSIS — Z79899 Other long term (current) drug therapy: Secondary | ICD-10-CM | POA: Diagnosis not present

## 2018-02-20 DIAGNOSIS — Z6835 Body mass index (BMI) 35.0-35.9, adult: Secondary | ICD-10-CM | POA: Diagnosis not present

## 2018-02-20 DIAGNOSIS — K219 Gastro-esophageal reflux disease without esophagitis: Secondary | ICD-10-CM | POA: Diagnosis not present

## 2018-02-20 DIAGNOSIS — I4891 Unspecified atrial fibrillation: Secondary | ICD-10-CM | POA: Diagnosis not present

## 2018-02-20 DIAGNOSIS — Z299 Encounter for prophylactic measures, unspecified: Secondary | ICD-10-CM | POA: Diagnosis not present

## 2018-02-20 DIAGNOSIS — E119 Type 2 diabetes mellitus without complications: Secondary | ICD-10-CM | POA: Diagnosis not present

## 2018-02-22 DIAGNOSIS — B351 Tinea unguium: Secondary | ICD-10-CM | POA: Diagnosis not present

## 2018-02-22 DIAGNOSIS — E1151 Type 2 diabetes mellitus with diabetic peripheral angiopathy without gangrene: Secondary | ICD-10-CM | POA: Diagnosis not present

## 2018-02-22 DIAGNOSIS — L110 Acquired keratosis follicularis: Secondary | ICD-10-CM | POA: Diagnosis not present

## 2018-02-22 DIAGNOSIS — E1140 Type 2 diabetes mellitus with diabetic neuropathy, unspecified: Secondary | ICD-10-CM | POA: Diagnosis not present

## 2018-02-27 DIAGNOSIS — Z713 Dietary counseling and surveillance: Secondary | ICD-10-CM | POA: Diagnosis not present

## 2018-02-27 DIAGNOSIS — Z6835 Body mass index (BMI) 35.0-35.9, adult: Secondary | ICD-10-CM | POA: Diagnosis not present

## 2018-02-27 DIAGNOSIS — I10 Essential (primary) hypertension: Secondary | ICD-10-CM | POA: Diagnosis not present

## 2018-02-27 DIAGNOSIS — I4891 Unspecified atrial fibrillation: Secondary | ICD-10-CM | POA: Diagnosis not present

## 2018-02-27 DIAGNOSIS — Z299 Encounter for prophylactic measures, unspecified: Secondary | ICD-10-CM | POA: Diagnosis not present

## 2018-03-16 DIAGNOSIS — I10 Essential (primary) hypertension: Secondary | ICD-10-CM | POA: Diagnosis not present

## 2018-03-16 DIAGNOSIS — I2510 Atherosclerotic heart disease of native coronary artery without angina pectoris: Secondary | ICD-10-CM | POA: Diagnosis not present

## 2018-03-16 DIAGNOSIS — M159 Polyosteoarthritis, unspecified: Secondary | ICD-10-CM | POA: Diagnosis not present

## 2018-03-16 DIAGNOSIS — E119 Type 2 diabetes mellitus without complications: Secondary | ICD-10-CM | POA: Diagnosis not present

## 2018-04-14 DIAGNOSIS — E7800 Pure hypercholesterolemia, unspecified: Secondary | ICD-10-CM | POA: Diagnosis not present

## 2018-04-14 DIAGNOSIS — I4891 Unspecified atrial fibrillation: Secondary | ICD-10-CM | POA: Diagnosis not present

## 2018-04-14 DIAGNOSIS — I10 Essential (primary) hypertension: Secondary | ICD-10-CM | POA: Diagnosis not present

## 2018-04-14 DIAGNOSIS — Z299 Encounter for prophylactic measures, unspecified: Secondary | ICD-10-CM | POA: Diagnosis not present

## 2018-04-14 DIAGNOSIS — Z6836 Body mass index (BMI) 36.0-36.9, adult: Secondary | ICD-10-CM | POA: Diagnosis not present

## 2018-05-10 DIAGNOSIS — E1151 Type 2 diabetes mellitus with diabetic peripheral angiopathy without gangrene: Secondary | ICD-10-CM | POA: Diagnosis not present

## 2018-05-10 DIAGNOSIS — Z299 Encounter for prophylactic measures, unspecified: Secondary | ICD-10-CM | POA: Diagnosis not present

## 2018-05-10 DIAGNOSIS — E1140 Type 2 diabetes mellitus with diabetic neuropathy, unspecified: Secondary | ICD-10-CM | POA: Diagnosis not present

## 2018-05-10 DIAGNOSIS — L110 Acquired keratosis follicularis: Secondary | ICD-10-CM | POA: Diagnosis not present

## 2018-05-10 DIAGNOSIS — E1165 Type 2 diabetes mellitus with hyperglycemia: Secondary | ICD-10-CM | POA: Diagnosis not present

## 2018-05-10 DIAGNOSIS — B351 Tinea unguium: Secondary | ICD-10-CM | POA: Diagnosis not present

## 2018-05-10 DIAGNOSIS — J449 Chronic obstructive pulmonary disease, unspecified: Secondary | ICD-10-CM | POA: Diagnosis not present

## 2018-05-10 DIAGNOSIS — Z6835 Body mass index (BMI) 35.0-35.9, adult: Secondary | ICD-10-CM | POA: Diagnosis not present

## 2018-05-10 DIAGNOSIS — I10 Essential (primary) hypertension: Secondary | ICD-10-CM | POA: Diagnosis not present

## 2018-05-10 DIAGNOSIS — I4891 Unspecified atrial fibrillation: Secondary | ICD-10-CM | POA: Diagnosis not present

## 2018-05-15 DIAGNOSIS — E1165 Type 2 diabetes mellitus with hyperglycemia: Secondary | ICD-10-CM | POA: Diagnosis not present

## 2018-05-15 DIAGNOSIS — M7552 Bursitis of left shoulder: Secondary | ICD-10-CM | POA: Diagnosis not present

## 2018-05-15 DIAGNOSIS — Z299 Encounter for prophylactic measures, unspecified: Secondary | ICD-10-CM | POA: Diagnosis not present

## 2018-05-15 DIAGNOSIS — I10 Essential (primary) hypertension: Secondary | ICD-10-CM | POA: Diagnosis not present

## 2018-05-15 DIAGNOSIS — I4891 Unspecified atrial fibrillation: Secondary | ICD-10-CM | POA: Diagnosis not present

## 2018-05-15 DIAGNOSIS — Z6835 Body mass index (BMI) 35.0-35.9, adult: Secondary | ICD-10-CM | POA: Diagnosis not present

## 2018-06-08 DIAGNOSIS — M159 Polyosteoarthritis, unspecified: Secondary | ICD-10-CM | POA: Diagnosis not present

## 2018-06-08 DIAGNOSIS — E119 Type 2 diabetes mellitus without complications: Secondary | ICD-10-CM | POA: Diagnosis not present

## 2018-06-08 DIAGNOSIS — I2510 Atherosclerotic heart disease of native coronary artery without angina pectoris: Secondary | ICD-10-CM | POA: Diagnosis not present

## 2018-06-08 DIAGNOSIS — I10 Essential (primary) hypertension: Secondary | ICD-10-CM | POA: Diagnosis not present

## 2018-06-09 DIAGNOSIS — I10 Essential (primary) hypertension: Secondary | ICD-10-CM | POA: Diagnosis not present

## 2018-06-09 DIAGNOSIS — Z6835 Body mass index (BMI) 35.0-35.9, adult: Secondary | ICD-10-CM | POA: Diagnosis not present

## 2018-06-09 DIAGNOSIS — I4891 Unspecified atrial fibrillation: Secondary | ICD-10-CM | POA: Diagnosis not present

## 2018-06-09 DIAGNOSIS — Z713 Dietary counseling and surveillance: Secondary | ICD-10-CM | POA: Diagnosis not present

## 2018-06-09 DIAGNOSIS — E1165 Type 2 diabetes mellitus with hyperglycemia: Secondary | ICD-10-CM | POA: Diagnosis not present

## 2018-06-09 DIAGNOSIS — Z299 Encounter for prophylactic measures, unspecified: Secondary | ICD-10-CM | POA: Diagnosis not present

## 2018-06-30 DIAGNOSIS — R0602 Shortness of breath: Secondary | ICD-10-CM | POA: Diagnosis not present

## 2018-06-30 DIAGNOSIS — J479 Bronchiectasis, uncomplicated: Secondary | ICD-10-CM | POA: Diagnosis not present

## 2018-06-30 DIAGNOSIS — Z6835 Body mass index (BMI) 35.0-35.9, adult: Secondary | ICD-10-CM | POA: Diagnosis not present

## 2018-06-30 DIAGNOSIS — I10 Essential (primary) hypertension: Secondary | ICD-10-CM | POA: Diagnosis not present

## 2018-06-30 DIAGNOSIS — I4891 Unspecified atrial fibrillation: Secondary | ICD-10-CM | POA: Diagnosis not present

## 2018-06-30 DIAGNOSIS — Z299 Encounter for prophylactic measures, unspecified: Secondary | ICD-10-CM | POA: Diagnosis not present

## 2018-06-30 DIAGNOSIS — J948 Other specified pleural conditions: Secondary | ICD-10-CM | POA: Diagnosis not present

## 2018-07-06 DIAGNOSIS — E119 Type 2 diabetes mellitus without complications: Secondary | ICD-10-CM | POA: Diagnosis not present

## 2018-07-06 DIAGNOSIS — I10 Essential (primary) hypertension: Secondary | ICD-10-CM | POA: Diagnosis not present

## 2018-07-06 DIAGNOSIS — M159 Polyosteoarthritis, unspecified: Secondary | ICD-10-CM | POA: Diagnosis not present

## 2018-07-06 DIAGNOSIS — I2510 Atherosclerotic heart disease of native coronary artery without angina pectoris: Secondary | ICD-10-CM | POA: Diagnosis not present

## 2018-07-11 DIAGNOSIS — I4891 Unspecified atrial fibrillation: Secondary | ICD-10-CM | POA: Diagnosis not present

## 2018-07-11 DIAGNOSIS — I10 Essential (primary) hypertension: Secondary | ICD-10-CM | POA: Diagnosis not present

## 2018-07-11 DIAGNOSIS — Z6835 Body mass index (BMI) 35.0-35.9, adult: Secondary | ICD-10-CM | POA: Diagnosis not present

## 2018-07-11 DIAGNOSIS — Z23 Encounter for immunization: Secondary | ICD-10-CM | POA: Diagnosis not present

## 2018-07-12 DIAGNOSIS — J479 Bronchiectasis, uncomplicated: Secondary | ICD-10-CM | POA: Diagnosis not present

## 2018-07-12 DIAGNOSIS — J9611 Chronic respiratory failure with hypoxia: Secondary | ICD-10-CM | POA: Diagnosis not present

## 2018-07-12 DIAGNOSIS — J324 Chronic pansinusitis: Secondary | ICD-10-CM | POA: Diagnosis not present

## 2018-07-12 DIAGNOSIS — R05 Cough: Secondary | ICD-10-CM | POA: Diagnosis not present

## 2018-07-24 DIAGNOSIS — R918 Other nonspecific abnormal finding of lung field: Secondary | ICD-10-CM | POA: Diagnosis not present

## 2018-07-26 DIAGNOSIS — L110 Acquired keratosis follicularis: Secondary | ICD-10-CM | POA: Diagnosis not present

## 2018-07-26 DIAGNOSIS — E1140 Type 2 diabetes mellitus with diabetic neuropathy, unspecified: Secondary | ICD-10-CM | POA: Diagnosis not present

## 2018-07-26 DIAGNOSIS — B351 Tinea unguium: Secondary | ICD-10-CM | POA: Diagnosis not present

## 2018-07-26 DIAGNOSIS — E1151 Type 2 diabetes mellitus with diabetic peripheral angiopathy without gangrene: Secondary | ICD-10-CM | POA: Diagnosis not present

## 2018-08-03 DIAGNOSIS — M159 Polyosteoarthritis, unspecified: Secondary | ICD-10-CM | POA: Diagnosis not present

## 2018-08-03 DIAGNOSIS — I2510 Atherosclerotic heart disease of native coronary artery without angina pectoris: Secondary | ICD-10-CM | POA: Diagnosis not present

## 2018-08-03 DIAGNOSIS — E119 Type 2 diabetes mellitus without complications: Secondary | ICD-10-CM | POA: Diagnosis not present

## 2018-08-03 DIAGNOSIS — I10 Essential (primary) hypertension: Secondary | ICD-10-CM | POA: Diagnosis not present

## 2018-08-07 DIAGNOSIS — I4891 Unspecified atrial fibrillation: Secondary | ICD-10-CM | POA: Diagnosis not present

## 2018-08-07 DIAGNOSIS — Z789 Other specified health status: Secondary | ICD-10-CM | POA: Diagnosis not present

## 2018-08-07 DIAGNOSIS — J069 Acute upper respiratory infection, unspecified: Secondary | ICD-10-CM | POA: Diagnosis not present

## 2018-08-07 DIAGNOSIS — I10 Essential (primary) hypertension: Secondary | ICD-10-CM | POA: Diagnosis not present

## 2018-08-07 DIAGNOSIS — Z6834 Body mass index (BMI) 34.0-34.9, adult: Secondary | ICD-10-CM | POA: Diagnosis not present

## 2018-08-07 DIAGNOSIS — Z299 Encounter for prophylactic measures, unspecified: Secondary | ICD-10-CM | POA: Diagnosis not present

## 2018-08-31 DIAGNOSIS — J479 Bronchiectasis, uncomplicated: Secondary | ICD-10-CM | POA: Diagnosis not present

## 2018-08-31 DIAGNOSIS — R0609 Other forms of dyspnea: Secondary | ICD-10-CM | POA: Diagnosis not present

## 2018-08-31 DIAGNOSIS — J9611 Chronic respiratory failure with hypoxia: Secondary | ICD-10-CM | POA: Diagnosis not present

## 2018-08-31 DIAGNOSIS — J432 Centrilobular emphysema: Secondary | ICD-10-CM | POA: Diagnosis not present

## 2018-08-31 DIAGNOSIS — J411 Mucopurulent chronic bronchitis: Secondary | ICD-10-CM | POA: Diagnosis not present

## 2018-09-01 DIAGNOSIS — I2510 Atherosclerotic heart disease of native coronary artery without angina pectoris: Secondary | ICD-10-CM | POA: Diagnosis not present

## 2018-09-01 DIAGNOSIS — E119 Type 2 diabetes mellitus without complications: Secondary | ICD-10-CM | POA: Diagnosis not present

## 2018-09-01 DIAGNOSIS — M159 Polyosteoarthritis, unspecified: Secondary | ICD-10-CM | POA: Diagnosis not present

## 2018-09-01 DIAGNOSIS — I10 Essential (primary) hypertension: Secondary | ICD-10-CM | POA: Diagnosis not present

## 2018-09-05 DIAGNOSIS — Z6834 Body mass index (BMI) 34.0-34.9, adult: Secondary | ICD-10-CM | POA: Diagnosis not present

## 2018-09-05 DIAGNOSIS — J479 Bronchiectasis, uncomplicated: Secondary | ICD-10-CM | POA: Diagnosis not present

## 2018-09-05 DIAGNOSIS — I4891 Unspecified atrial fibrillation: Secondary | ICD-10-CM | POA: Diagnosis not present

## 2018-09-05 DIAGNOSIS — I10 Essential (primary) hypertension: Secondary | ICD-10-CM | POA: Diagnosis not present

## 2018-09-05 DIAGNOSIS — J449 Chronic obstructive pulmonary disease, unspecified: Secondary | ICD-10-CM | POA: Diagnosis not present

## 2018-09-05 DIAGNOSIS — Z299 Encounter for prophylactic measures, unspecified: Secondary | ICD-10-CM | POA: Diagnosis not present

## 2018-09-29 DIAGNOSIS — M159 Polyosteoarthritis, unspecified: Secondary | ICD-10-CM | POA: Diagnosis not present

## 2018-09-29 DIAGNOSIS — I2510 Atherosclerotic heart disease of native coronary artery without angina pectoris: Secondary | ICD-10-CM | POA: Diagnosis not present

## 2018-09-29 DIAGNOSIS — E119 Type 2 diabetes mellitus without complications: Secondary | ICD-10-CM | POA: Diagnosis not present

## 2018-09-29 DIAGNOSIS — I10 Essential (primary) hypertension: Secondary | ICD-10-CM | POA: Diagnosis not present

## 2018-10-04 DIAGNOSIS — M79671 Pain in right foot: Secondary | ICD-10-CM | POA: Diagnosis not present

## 2018-10-04 DIAGNOSIS — E1151 Type 2 diabetes mellitus with diabetic peripheral angiopathy without gangrene: Secondary | ICD-10-CM | POA: Diagnosis not present

## 2018-10-04 DIAGNOSIS — B351 Tinea unguium: Secondary | ICD-10-CM | POA: Diagnosis not present

## 2018-10-04 DIAGNOSIS — E1165 Type 2 diabetes mellitus with hyperglycemia: Secondary | ICD-10-CM | POA: Diagnosis not present

## 2018-10-04 DIAGNOSIS — Z6835 Body mass index (BMI) 35.0-35.9, adult: Secondary | ICD-10-CM | POA: Diagnosis not present

## 2018-10-04 DIAGNOSIS — M79672 Pain in left foot: Secondary | ICD-10-CM | POA: Diagnosis not present

## 2018-10-04 DIAGNOSIS — E1140 Type 2 diabetes mellitus with diabetic neuropathy, unspecified: Secondary | ICD-10-CM | POA: Diagnosis not present

## 2018-10-04 DIAGNOSIS — L110 Acquired keratosis follicularis: Secondary | ICD-10-CM | POA: Diagnosis not present

## 2018-10-04 DIAGNOSIS — I10 Essential (primary) hypertension: Secondary | ICD-10-CM | POA: Diagnosis not present

## 2018-10-04 DIAGNOSIS — Z299 Encounter for prophylactic measures, unspecified: Secondary | ICD-10-CM | POA: Diagnosis not present

## 2018-10-04 DIAGNOSIS — I4891 Unspecified atrial fibrillation: Secondary | ICD-10-CM | POA: Diagnosis not present

## 2018-10-20 DIAGNOSIS — Z7189 Other specified counseling: Secondary | ICD-10-CM | POA: Diagnosis not present

## 2018-10-20 DIAGNOSIS — R5383 Other fatigue: Secondary | ICD-10-CM | POA: Diagnosis not present

## 2018-10-20 DIAGNOSIS — Z1211 Encounter for screening for malignant neoplasm of colon: Secondary | ICD-10-CM | POA: Diagnosis not present

## 2018-10-20 DIAGNOSIS — I4891 Unspecified atrial fibrillation: Secondary | ICD-10-CM | POA: Diagnosis not present

## 2018-10-20 DIAGNOSIS — Z1339 Encounter for screening examination for other mental health and behavioral disorders: Secondary | ICD-10-CM | POA: Diagnosis not present

## 2018-10-20 DIAGNOSIS — Z1331 Encounter for screening for depression: Secondary | ICD-10-CM | POA: Diagnosis not present

## 2018-10-20 DIAGNOSIS — Z299 Encounter for prophylactic measures, unspecified: Secondary | ICD-10-CM | POA: Diagnosis not present

## 2018-10-20 DIAGNOSIS — Z6835 Body mass index (BMI) 35.0-35.9, adult: Secondary | ICD-10-CM | POA: Diagnosis not present

## 2018-10-20 DIAGNOSIS — E7800 Pure hypercholesterolemia, unspecified: Secondary | ICD-10-CM | POA: Diagnosis not present

## 2018-10-20 DIAGNOSIS — E1142 Type 2 diabetes mellitus with diabetic polyneuropathy: Secondary | ICD-10-CM | POA: Diagnosis not present

## 2018-10-20 DIAGNOSIS — Z0000 Encounter for general adult medical examination without abnormal findings: Secondary | ICD-10-CM | POA: Diagnosis not present

## 2018-10-23 DIAGNOSIS — Z125 Encounter for screening for malignant neoplasm of prostate: Secondary | ICD-10-CM | POA: Diagnosis not present

## 2018-10-23 DIAGNOSIS — R5383 Other fatigue: Secondary | ICD-10-CM | POA: Diagnosis not present

## 2018-10-23 DIAGNOSIS — E7800 Pure hypercholesterolemia, unspecified: Secondary | ICD-10-CM | POA: Diagnosis not present

## 2018-10-23 DIAGNOSIS — Z79899 Other long term (current) drug therapy: Secondary | ICD-10-CM | POA: Diagnosis not present

## 2018-10-27 DIAGNOSIS — I2510 Atherosclerotic heart disease of native coronary artery without angina pectoris: Secondary | ICD-10-CM | POA: Diagnosis not present

## 2018-10-27 DIAGNOSIS — M159 Polyosteoarthritis, unspecified: Secondary | ICD-10-CM | POA: Diagnosis not present

## 2018-10-27 DIAGNOSIS — E119 Type 2 diabetes mellitus without complications: Secondary | ICD-10-CM | POA: Diagnosis not present

## 2018-10-27 DIAGNOSIS — I10 Essential (primary) hypertension: Secondary | ICD-10-CM | POA: Diagnosis not present

## 2018-10-29 ENCOUNTER — Inpatient Hospital Stay (HOSPITAL_COMMUNITY)
Admission: AD | Admit: 2018-10-29 | Payer: Medicare Other | Source: Other Acute Inpatient Hospital | Admitting: Internal Medicine

## 2018-10-29 DIAGNOSIS — S72001A Fracture of unspecified part of neck of right femur, initial encounter for closed fracture: Secondary | ICD-10-CM | POA: Diagnosis not present

## 2018-10-29 DIAGNOSIS — J9589 Other postprocedural complications and disorders of respiratory system, not elsewhere classified: Secondary | ICD-10-CM | POA: Diagnosis not present

## 2018-10-29 DIAGNOSIS — I10 Essential (primary) hypertension: Secondary | ICD-10-CM | POA: Diagnosis not present

## 2018-10-29 DIAGNOSIS — S72042A Displaced fracture of base of neck of left femur, initial encounter for closed fracture: Secondary | ICD-10-CM | POA: Diagnosis not present

## 2018-10-29 DIAGNOSIS — I959 Hypotension, unspecified: Secondary | ICD-10-CM | POA: Diagnosis not present

## 2018-10-29 DIAGNOSIS — S199XXA Unspecified injury of neck, initial encounter: Secondary | ICD-10-CM | POA: Diagnosis not present

## 2018-10-29 DIAGNOSIS — A419 Sepsis, unspecified organism: Secondary | ICD-10-CM | POA: Diagnosis not present

## 2018-10-29 DIAGNOSIS — W19XXXA Unspecified fall, initial encounter: Secondary | ICD-10-CM | POA: Diagnosis not present

## 2018-10-29 DIAGNOSIS — J690 Pneumonitis due to inhalation of food and vomit: Secondary | ICD-10-CM | POA: Diagnosis not present

## 2018-10-29 DIAGNOSIS — R000 Tachycardia, unspecified: Secondary | ICD-10-CM | POA: Diagnosis not present

## 2018-10-29 DIAGNOSIS — E119 Type 2 diabetes mellitus without complications: Secondary | ICD-10-CM | POA: Diagnosis not present

## 2018-10-29 DIAGNOSIS — J9621 Acute and chronic respiratory failure with hypoxia: Secondary | ICD-10-CM | POA: Diagnosis not present

## 2018-10-29 DIAGNOSIS — R51 Headache: Secondary | ICD-10-CM | POA: Diagnosis not present

## 2018-10-29 DIAGNOSIS — S0990XA Unspecified injury of head, initial encounter: Secondary | ICD-10-CM | POA: Diagnosis not present

## 2018-10-29 DIAGNOSIS — M542 Cervicalgia: Secondary | ICD-10-CM | POA: Diagnosis not present

## 2018-10-29 DIAGNOSIS — S72002A Fracture of unspecified part of neck of left femur, initial encounter for closed fracture: Secondary | ICD-10-CM | POA: Diagnosis not present

## 2018-10-29 DIAGNOSIS — R52 Pain, unspecified: Secondary | ICD-10-CM | POA: Diagnosis not present

## 2018-10-29 DIAGNOSIS — Z01818 Encounter for other preprocedural examination: Secondary | ICD-10-CM | POA: Diagnosis not present

## 2018-10-29 DIAGNOSIS — I482 Chronic atrial fibrillation, unspecified: Secondary | ICD-10-CM | POA: Diagnosis not present

## 2018-10-29 DIAGNOSIS — Z794 Long term (current) use of insulin: Secondary | ICD-10-CM | POA: Diagnosis not present

## 2018-10-29 DIAGNOSIS — R0689 Other abnormalities of breathing: Secondary | ICD-10-CM | POA: Diagnosis not present

## 2018-10-30 DIAGNOSIS — Z96662 Presence of left artificial ankle joint: Secondary | ICD-10-CM | POA: Diagnosis not present

## 2018-10-30 DIAGNOSIS — S72052A Unspecified fracture of head of left femur, initial encounter for closed fracture: Secondary | ICD-10-CM | POA: Diagnosis not present

## 2018-10-30 DIAGNOSIS — Z7901 Long term (current) use of anticoagulants: Secondary | ICD-10-CM | POA: Diagnosis not present

## 2018-10-30 DIAGNOSIS — A419 Sepsis, unspecified organism: Secondary | ICD-10-CM | POA: Diagnosis not present

## 2018-10-30 DIAGNOSIS — Z91041 Radiographic dye allergy status: Secondary | ICD-10-CM | POA: Diagnosis not present

## 2018-10-30 DIAGNOSIS — I110 Hypertensive heart disease with heart failure: Secondary | ICD-10-CM | POA: Diagnosis present

## 2018-10-30 DIAGNOSIS — J90 Pleural effusion, not elsewhere classified: Secondary | ICD-10-CM | POA: Diagnosis not present

## 2018-10-30 DIAGNOSIS — S72002A Fracture of unspecified part of neck of left femur, initial encounter for closed fracture: Secondary | ICD-10-CM | POA: Diagnosis present

## 2018-10-30 DIAGNOSIS — J690 Pneumonitis due to inhalation of food and vomit: Secondary | ICD-10-CM | POA: Diagnosis not present

## 2018-10-30 DIAGNOSIS — Z882 Allergy status to sulfonamides status: Secondary | ICD-10-CM | POA: Diagnosis not present

## 2018-10-30 DIAGNOSIS — Z79899 Other long term (current) drug therapy: Secondary | ICD-10-CM | POA: Diagnosis not present

## 2018-10-30 DIAGNOSIS — J9621 Acute and chronic respiratory failure with hypoxia: Secondary | ICD-10-CM | POA: Diagnosis present

## 2018-10-30 DIAGNOSIS — J9589 Other postprocedural complications and disorders of respiratory system, not elsewhere classified: Secondary | ICD-10-CM | POA: Diagnosis not present

## 2018-10-30 DIAGNOSIS — E7800 Pure hypercholesterolemia, unspecified: Secondary | ICD-10-CM | POA: Diagnosis present

## 2018-10-30 DIAGNOSIS — Z96641 Presence of right artificial hip joint: Secondary | ICD-10-CM | POA: Diagnosis present

## 2018-10-30 DIAGNOSIS — I509 Heart failure, unspecified: Secondary | ICD-10-CM | POA: Diagnosis present

## 2018-10-30 DIAGNOSIS — K219 Gastro-esophageal reflux disease without esophagitis: Secondary | ICD-10-CM | POA: Diagnosis present

## 2018-10-30 DIAGNOSIS — I482 Chronic atrial fibrillation, unspecified: Secondary | ICD-10-CM | POA: Diagnosis present

## 2018-10-30 DIAGNOSIS — J449 Chronic obstructive pulmonary disease, unspecified: Secondary | ICD-10-CM | POA: Diagnosis present

## 2018-10-30 DIAGNOSIS — Z471 Aftercare following joint replacement surgery: Secondary | ICD-10-CM | POA: Diagnosis not present

## 2018-10-30 DIAGNOSIS — Z7984 Long term (current) use of oral hypoglycemic drugs: Secondary | ICD-10-CM | POA: Diagnosis not present

## 2018-10-30 DIAGNOSIS — E119 Type 2 diabetes mellitus without complications: Secondary | ICD-10-CM | POA: Diagnosis present

## 2018-10-30 DIAGNOSIS — Z902 Acquired absence of lung [part of]: Secondary | ICD-10-CM | POA: Diagnosis not present

## 2018-10-31 DIAGNOSIS — S72002A Fracture of unspecified part of neck of left femur, initial encounter for closed fracture: Secondary | ICD-10-CM | POA: Diagnosis not present

## 2018-11-02 ENCOUNTER — Inpatient Hospital Stay (HOSPITAL_COMMUNITY)
Admission: AD | Admit: 2018-11-02 | Discharge: 2018-11-10 | DRG: 208 | Disposition: A | Discharge status: Skilled Nursing Facility | Payer: Medicare Other | Source: Skilled Nursing Facility | Attending: Family Medicine | Admitting: Family Medicine

## 2018-11-02 ENCOUNTER — Inpatient Hospital Stay (HOSPITAL_COMMUNITY): Payer: Medicare Other

## 2018-11-02 DIAGNOSIS — J690 Pneumonitis due to inhalation of food and vomit: Secondary | ICD-10-CM | POA: Diagnosis present

## 2018-11-02 DIAGNOSIS — S72002A Fracture of unspecified part of neck of left femur, initial encounter for closed fracture: Secondary | ICD-10-CM | POA: Diagnosis not present

## 2018-11-02 DIAGNOSIS — E785 Hyperlipidemia, unspecified: Secondary | ICD-10-CM | POA: Diagnosis present

## 2018-11-02 DIAGNOSIS — J9691 Respiratory failure, unspecified with hypoxia: Secondary | ICD-10-CM

## 2018-11-02 DIAGNOSIS — Z7901 Long term (current) use of anticoagulants: Secondary | ICD-10-CM

## 2018-11-02 DIAGNOSIS — Z96642 Presence of left artificial hip joint: Secondary | ICD-10-CM | POA: Diagnosis not present

## 2018-11-02 DIAGNOSIS — Z9119 Patient's noncompliance with other medical treatment and regimen: Secondary | ICD-10-CM

## 2018-11-02 DIAGNOSIS — M109 Gout, unspecified: Secondary | ICD-10-CM | POA: Diagnosis present

## 2018-11-02 DIAGNOSIS — G4733 Obstructive sleep apnea (adult) (pediatric): Secondary | ICD-10-CM | POA: Diagnosis present

## 2018-11-02 DIAGNOSIS — N400 Enlarged prostate without lower urinary tract symptoms: Secondary | ICD-10-CM | POA: Diagnosis present

## 2018-11-02 DIAGNOSIS — M2550 Pain in unspecified joint: Secondary | ICD-10-CM | POA: Diagnosis not present

## 2018-11-02 DIAGNOSIS — K219 Gastro-esophageal reflux disease without esophagitis: Secondary | ICD-10-CM | POA: Diagnosis present

## 2018-11-02 DIAGNOSIS — N179 Acute kidney failure, unspecified: Secondary | ICD-10-CM | POA: Diagnosis present

## 2018-11-02 DIAGNOSIS — N183 Chronic kidney disease, stage 3 unspecified: Secondary | ICD-10-CM

## 2018-11-02 DIAGNOSIS — Z7401 Bed confinement status: Secondary | ICD-10-CM | POA: Diagnosis not present

## 2018-11-02 DIAGNOSIS — I4821 Permanent atrial fibrillation: Secondary | ICD-10-CM | POA: Diagnosis not present

## 2018-11-02 DIAGNOSIS — J9621 Acute and chronic respiratory failure with hypoxia: Secondary | ICD-10-CM | POA: Diagnosis not present

## 2018-11-02 DIAGNOSIS — I358 Other nonrheumatic aortic valve disorders: Secondary | ICD-10-CM | POA: Diagnosis not present

## 2018-11-02 DIAGNOSIS — J9601 Acute respiratory failure with hypoxia: Secondary | ICD-10-CM

## 2018-11-02 DIAGNOSIS — J449 Chronic obstructive pulmonary disease, unspecified: Secondary | ICD-10-CM | POA: Diagnosis not present

## 2018-11-02 DIAGNOSIS — J9690 Respiratory failure, unspecified, unspecified whether with hypoxia or hypercapnia: Secondary | ICD-10-CM | POA: Diagnosis not present

## 2018-11-02 DIAGNOSIS — I4891 Unspecified atrial fibrillation: Secondary | ICD-10-CM | POA: Diagnosis not present

## 2018-11-02 DIAGNOSIS — Z818 Family history of other mental and behavioral disorders: Secondary | ICD-10-CM

## 2018-11-02 DIAGNOSIS — A419 Sepsis, unspecified organism: Secondary | ICD-10-CM | POA: Diagnosis not present

## 2018-11-02 DIAGNOSIS — J9602 Acute respiratory failure with hypercapnia: Secondary | ICD-10-CM | POA: Diagnosis present

## 2018-11-02 DIAGNOSIS — Z8249 Family history of ischemic heart disease and other diseases of the circulatory system: Secondary | ICD-10-CM

## 2018-11-02 DIAGNOSIS — D696 Thrombocytopenia, unspecified: Secondary | ICD-10-CM | POA: Diagnosis not present

## 2018-11-02 DIAGNOSIS — I4819 Other persistent atrial fibrillation: Secondary | ICD-10-CM | POA: Diagnosis present

## 2018-11-02 DIAGNOSIS — I10 Essential (primary) hypertension: Secondary | ICD-10-CM | POA: Diagnosis present

## 2018-11-02 DIAGNOSIS — Z833 Family history of diabetes mellitus: Secondary | ICD-10-CM | POA: Diagnosis not present

## 2018-11-02 DIAGNOSIS — F0390 Unspecified dementia without behavioral disturbance: Secondary | ICD-10-CM | POA: Diagnosis present

## 2018-11-02 DIAGNOSIS — G9340 Encephalopathy, unspecified: Secondary | ICD-10-CM | POA: Diagnosis present

## 2018-11-02 DIAGNOSIS — R1310 Dysphagia, unspecified: Secondary | ICD-10-CM | POA: Diagnosis not present

## 2018-11-02 DIAGNOSIS — E1151 Type 2 diabetes mellitus with diabetic peripheral angiopathy without gangrene: Secondary | ICD-10-CM | POA: Diagnosis present

## 2018-11-02 DIAGNOSIS — R918 Other nonspecific abnormal finding of lung field: Secondary | ICD-10-CM | POA: Diagnosis not present

## 2018-11-02 DIAGNOSIS — S0003XA Contusion of scalp, initial encounter: Secondary | ICD-10-CM | POA: Diagnosis not present

## 2018-11-02 DIAGNOSIS — E1122 Type 2 diabetes mellitus with diabetic chronic kidney disease: Secondary | ICD-10-CM | POA: Diagnosis present

## 2018-11-02 DIAGNOSIS — E8770 Fluid overload, unspecified: Secondary | ICD-10-CM | POA: Diagnosis present

## 2018-11-02 DIAGNOSIS — Z96643 Presence of artificial hip joint, bilateral: Secondary | ICD-10-CM | POA: Diagnosis present

## 2018-11-02 DIAGNOSIS — E7800 Pure hypercholesterolemia, unspecified: Secondary | ICD-10-CM | POA: Diagnosis present

## 2018-11-02 DIAGNOSIS — Z79899 Other long term (current) drug therapy: Secondary | ICD-10-CM | POA: Diagnosis not present

## 2018-11-02 DIAGNOSIS — Z4682 Encounter for fitting and adjustment of non-vascular catheter: Secondary | ICD-10-CM | POA: Diagnosis not present

## 2018-11-02 DIAGNOSIS — J9610 Chronic respiratory failure, unspecified whether with hypoxia or hypercapnia: Secondary | ICD-10-CM | POA: Diagnosis not present

## 2018-11-02 DIAGNOSIS — J479 Bronchiectasis, uncomplicated: Secondary | ICD-10-CM

## 2018-11-02 DIAGNOSIS — R0602 Shortness of breath: Secondary | ICD-10-CM | POA: Diagnosis not present

## 2018-11-02 DIAGNOSIS — Z4659 Encounter for fitting and adjustment of other gastrointestinal appliance and device: Secondary | ICD-10-CM

## 2018-11-02 DIAGNOSIS — R0902 Hypoxemia: Secondary | ICD-10-CM | POA: Diagnosis not present

## 2018-11-02 DIAGNOSIS — R1312 Dysphagia, oropharyngeal phase: Secondary | ICD-10-CM | POA: Diagnosis not present

## 2018-11-02 DIAGNOSIS — G253 Myoclonus: Secondary | ICD-10-CM | POA: Diagnosis not present

## 2018-11-02 DIAGNOSIS — E119 Type 2 diabetes mellitus without complications: Secondary | ICD-10-CM | POA: Diagnosis not present

## 2018-11-02 DIAGNOSIS — Z7984 Long term (current) use of oral hypoglycemic drugs: Secondary | ICD-10-CM

## 2018-11-02 DIAGNOSIS — I517 Cardiomegaly: Secondary | ICD-10-CM | POA: Diagnosis not present

## 2018-11-02 DIAGNOSIS — H547 Unspecified visual loss: Secondary | ICD-10-CM | POA: Diagnosis present

## 2018-11-02 LAB — POCT I-STAT 3, ART BLOOD GAS (G3+)
Acid-base deficit: 7 mmol/L — ABNORMAL HIGH (ref 0.0–2.0)
Bicarbonate: 20.2 mmol/L (ref 20.0–28.0)
O2 Saturation: 100 %
PCO2 ART: 44.5 mmHg (ref 32.0–48.0)
Patient temperature: 98.6
TCO2: 22 mmol/L (ref 22–32)
pH, Arterial: 7.266 — ABNORMAL LOW (ref 7.350–7.450)
pO2, Arterial: 358 mmHg — ABNORMAL HIGH (ref 83.0–108.0)

## 2018-11-02 LAB — COMPREHENSIVE METABOLIC PANEL
ALT: 85 U/L — ABNORMAL HIGH (ref 0–44)
AST: 143 U/L — ABNORMAL HIGH (ref 15–41)
Albumin: 2.4 g/dL — ABNORMAL LOW (ref 3.5–5.0)
Alkaline Phosphatase: 55 U/L (ref 38–126)
Anion gap: 14 (ref 5–15)
BUN: 42 mg/dL — ABNORMAL HIGH (ref 8–23)
CO2: 18 mmol/L — ABNORMAL LOW (ref 22–32)
Calcium: 7.9 mg/dL — ABNORMAL LOW (ref 8.9–10.3)
Chloride: 106 mmol/L (ref 98–111)
Creatinine, Ser: 2.51 mg/dL — ABNORMAL HIGH (ref 0.61–1.24)
GFR calc Af Amer: 28 mL/min — ABNORMAL LOW (ref >60)
GFR calc non Af Amer: 24 mL/min — ABNORMAL LOW (ref >60)
Glucose, Bld: 270 mg/dL — ABNORMAL HIGH (ref 70–99)
Potassium: 5.1 mmol/L (ref 3.5–5.1)
Sodium: 138 mmol/L (ref 135–145)
Total Bilirubin: 1.4 mg/dL — ABNORMAL HIGH (ref 0.3–1.2)
Total Protein: 5.4 g/dL — ABNORMAL LOW (ref 6.5–8.1)

## 2018-11-02 LAB — CBC
HCT: 26.4 % — ABNORMAL LOW (ref 39.0–52.0)
Hemoglobin: 8.4 g/dL — ABNORMAL LOW (ref 13.0–17.0)
MCH: 29.1 pg (ref 26.0–34.0)
MCHC: 31.8 g/dL (ref 30.0–36.0)
MCV: 91.3 fL (ref 80.0–100.0)
NRBC: 0.2 % (ref 0.0–0.2)
Platelets: 81 10*3/uL — ABNORMAL LOW (ref 150–400)
RBC: 2.89 MIL/uL — ABNORMAL LOW (ref 4.22–5.81)
RDW: 17.4 % — ABNORMAL HIGH (ref 11.5–15.5)
WBC: 21.6 10*3/uL — ABNORMAL HIGH (ref 4.0–10.5)

## 2018-11-02 LAB — PROTIME-INR
INR: 1.52
PROTHROMBIN TIME: 18.1 s — AB (ref 11.4–15.2)

## 2018-11-02 LAB — TRIGLYCERIDES: Triglycerides: 140 mg/dL (ref <150)

## 2018-11-02 LAB — PHOSPHORUS: Phosphorus: 4.8 mg/dL — ABNORMAL HIGH (ref 2.5–4.6)

## 2018-11-02 LAB — MRSA PCR SCREENING: MRSA BY PCR: NEGATIVE

## 2018-11-02 LAB — GLUCOSE, CAPILLARY
Glucose-Capillary: 237 mg/dL — ABNORMAL HIGH (ref 70–99)
Glucose-Capillary: 270 mg/dL — ABNORMAL HIGH (ref 70–99)

## 2018-11-02 LAB — MAGNESIUM: Magnesium: 1.8 mg/dL (ref 1.7–2.4)

## 2018-11-02 MED ORDER — PROPOFOL 1000 MG/100ML IV EMUL: 5.0000 ug/kg/min | INTRAVENOUS | Status: DC

## 2018-11-02 MED ORDER — FENTANYL CITRATE (PF) 100 MCG/2ML IJ SOLN
50.0000 ug | INTRAMUSCULAR | Status: DC | PRN
  Administered 2018-11-03 (×3): 50 ug via INTRAVENOUS
  Filled 2018-11-02 (×3): qty 2

## 2018-11-02 MED ORDER — INSULIN ASPART 100 UNIT/ML ~~LOC~~ SOLN
0.0000 [IU] | SUBCUTANEOUS | Status: DC
  Administered 2018-11-02: 8 [IU] via SUBCUTANEOUS
  Administered 2018-11-02 – 2018-11-03 (×2): 5 [IU] via SUBCUTANEOUS
  Administered 2018-11-03: 2 [IU] via SUBCUTANEOUS
  Administered 2018-11-03: 5 [IU] via SUBCUTANEOUS
  Administered 2018-11-03: 3 [IU] via SUBCUTANEOUS
  Administered 2018-11-03: 5 [IU] via SUBCUTANEOUS
  Administered 2018-11-03: 3 [IU] via SUBCUTANEOUS
  Administered 2018-11-04: 8 [IU] via SUBCUTANEOUS
  Administered 2018-11-04 (×3): 5 [IU] via SUBCUTANEOUS
  Administered 2018-11-04 – 2018-11-05 (×3): 8 [IU] via SUBCUTANEOUS
  Administered 2018-11-05: 3 [IU] via SUBCUTANEOUS
  Administered 2018-11-05 (×3): 5 [IU] via SUBCUTANEOUS
  Administered 2018-11-06 (×5): 2 [IU] via SUBCUTANEOUS
  Administered 2018-11-06: 3 [IU] via SUBCUTANEOUS
  Administered 2018-11-07 – 2018-11-08 (×9): 2 [IU] via SUBCUTANEOUS
  Administered 2018-11-08: 5 [IU] via SUBCUTANEOUS
  Administered 2018-11-08 – 2018-11-09 (×2): 2 [IU] via SUBCUTANEOUS
  Administered 2018-11-09: 3 [IU] via SUBCUTANEOUS
  Administered 2018-11-09: 5 [IU] via SUBCUTANEOUS
  Administered 2018-11-09: 8 [IU] via SUBCUTANEOUS
  Administered 2018-11-10: 2 [IU] via SUBCUTANEOUS
  Administered 2018-11-10: 3 [IU] via SUBCUTANEOUS
  Administered 2018-11-10: 2 [IU] via SUBCUTANEOUS
  Administered 2018-11-10: 3 [IU] via SUBCUTANEOUS

## 2018-11-02 MED ORDER — FENTANYL CITRATE (PF) 100 MCG/2ML IJ SOLN: 50.0000 ug | INTRAMUSCULAR | Status: DC | PRN

## 2018-11-02 MED ORDER — SIMVASTATIN 20 MG PO TABS
40.0000 mg | ORAL_TABLET | Freq: Every day | ORAL | Status: DC
  Administered 2018-11-03 – 2018-11-04 (×2): 40 mg
  Filled 2018-11-02 (×5): qty 2

## 2018-11-02 MED ORDER — ARFORMOTEROL TARTRATE 15 MCG/2ML IN NEBU
15.0000 ug | INHALATION_SOLUTION | Freq: Two times a day (BID) | RESPIRATORY_TRACT | Status: DC
  Administered 2018-11-02 – 2018-11-10 (×15): 15 ug via RESPIRATORY_TRACT
  Filled 2018-11-02 (×20): qty 2

## 2018-11-02 MED ORDER — PANTOPRAZOLE SODIUM 40 MG IV SOLR
40.0000 mg | Freq: Every day | INTRAVENOUS | Status: DC
  Administered 2018-11-02 – 2018-11-03 (×2): 40 mg via INTRAVENOUS
  Filled 2018-11-02 (×2): qty 40

## 2018-11-02 MED ORDER — METRONIDAZOLE IN NACL 5-0.79 MG/ML-% IV SOLN
500.0000 mg | Freq: Three times a day (TID) | INTRAVENOUS | Status: DC
  Administered 2018-11-02 – 2018-11-03 (×2): 500 mg via INTRAVENOUS
  Filled 2018-11-02 (×3): qty 100

## 2018-11-02 MED ORDER — PROPOFOL 1000 MG/100ML IV EMUL
0.0000 ug/kg/min | INTRAVENOUS | Status: DC
  Administered 2018-11-02: 15 ug/kg/min via INTRAVENOUS

## 2018-11-02 MED ORDER — NOREPINEPHRINE-SODIUM CHLORIDE 4-0.9 MG/250ML-% IV SOLN
0.0000 ug/min | INTRAVENOUS | Status: DC
  Administered 2018-11-02: 3 ug/min via INTRAVENOUS

## 2018-11-02 MED ORDER — PHENYLEPHRINE HCL-NACL 10-0.9 MG/250ML-% IV SOLN
0.0000 ug/min | INTRAVENOUS | Status: DC
  Filled 2018-11-02: qty 250

## 2018-11-02 MED ORDER — IPRATROPIUM-ALBUTEROL 0.5-2.5 (3) MG/3ML IN SOLN
3.0000 mL | Freq: Four times a day (QID) | RESPIRATORY_TRACT | Status: DC
  Administered 2018-11-02 – 2018-11-08 (×21): 3 mL via RESPIRATORY_TRACT
  Filled 2018-11-02 (×22): qty 3

## 2018-11-02 MED ORDER — DONEPEZIL HCL 5 MG PO TABS
5.0000 mg | ORAL_TABLET | Freq: Every day | ORAL | Status: DC
  Administered 2018-11-02 – 2018-11-08 (×4): 5 mg via ORAL
  Filled 2018-11-02 (×7): qty 1

## 2018-11-02 MED ORDER — SODIUM CHLORIDE 0.9 % IV SOLN
2.0000 g | INTRAVENOUS | Status: AC
  Administered 2018-11-03 – 2018-11-08 (×7): 2 g via INTRAVENOUS
  Filled 2018-11-02 (×10): qty 20

## 2018-11-02 MED ORDER — FINASTERIDE 5 MG PO TABS
5.0000 mg | ORAL_TABLET | Freq: Every day | ORAL | Status: DC
  Administered 2018-11-02 – 2018-11-10 (×6): 5 mg via ORAL
  Filled 2018-11-02 (×8): qty 1

## 2018-11-02 MED ORDER — AMIODARONE HCL IN DEXTROSE 360-4.14 MG/200ML-% IV SOLN
60.0000 mg/h | INTRAVENOUS | Status: AC
  Administered 2018-11-02: 60 mg/h via INTRAVENOUS
  Filled 2018-11-02: qty 200

## 2018-11-02 MED ORDER — SODIUM CHLORIDE 0.9 % IV SOLN
500.0000 mg | INTRAVENOUS | Status: DC
  Administered 2018-11-02 – 2018-11-03 (×2): 500 mg via INTRAVENOUS
  Filled 2018-11-02 (×2): qty 500

## 2018-11-02 MED ORDER — BUDESONIDE 0.25 MG/2ML IN SUSP
0.2500 mg | Freq: Two times a day (BID) | RESPIRATORY_TRACT | Status: DC
  Administered 2018-11-02 – 2018-11-10 (×16): 0.25 mg via RESPIRATORY_TRACT
  Filled 2018-11-02 (×19): qty 2

## 2018-11-02 MED ORDER — AMIODARONE HCL IN DEXTROSE 360-4.14 MG/200ML-% IV SOLN
30.0000 mg/h | INTRAVENOUS | Status: DC
  Administered 2018-11-03 – 2018-11-04 (×3): 30 mg/h via INTRAVENOUS
  Filled 2018-11-02 (×3): qty 200

## 2018-11-02 MED ORDER — ALBUTEROL SULFATE (2.5 MG/3ML) 0.083% IN NEBU: 2.5000 mg | INHALATION_SOLUTION | RESPIRATORY_TRACT | Status: DC | PRN

## 2018-11-02 MED ORDER — SODIUM CHLORIDE 0.9 % IV SOLN
INTRAVENOUS | Status: DC
  Administered 2018-11-02 – 2018-11-04 (×3): via INTRAVENOUS

## 2018-11-02 NOTE — H&P (Signed)
NAME:  [name] [name], MRN:  [id number], DOB:  Oct 04, 1941, LOS: 0 ADMISSION DATE:  11/02/2018, CONSULTATION DATE:  11/02/18 REFERRING MD:  Benson Norway  CHIEF COMPLAINT:  Hip Fx   Brief History   [name] [name] is a 78 y.o. male who was admitted to [facility] on 1/12 with left hip fx, underwent surgical fixation 1/14.  Had probable aspiration and required re-intubation shortly following extubation post op.  Transferred to [facility] 1/16 for further eval and management.  History of present illness   Pt is encephelopathic; therefore, this HPI is obtained from chart review. [name] [name] is a 78 y.o. male who has a PMH as outlined below (see "past medical history").  He presented to [facility] ED 1/12 after a fall in his driveway.  He complained of left hip pain and was found to have a left femoral neck fracture.  He was held in ED there while awaiting at bed at [facility].  There was no bed availability at [facility]; therefore, he was admitted to [facility] and underwent surgical fixation 1/14.  Per his wife, he was extubated in the OR; however, required reintubation shortly thereafter due to hypoxia and increased WOB.  There was talk of possible extubation 1/16; however, wife states they were told that he might have aspirated or be developing PNA; therefore, he remained intubated and was transferred to [facility] later that evening.  Past Medical History  COPD, bronchiectasis s/p lobectomy LLL, A.fib, HTN, HLD, GERD.  Significant Hospital Events   1/12 > presented to [facility] 1/14 > left hip surgery 1/16 > transferred to [facility].  Consults:  Ortho  Procedures:  1/14 > left hip surgical fixation (no documentation sent, so unable to tell what the procedure was).  Significant Diagnostic Tests:  CT head 1/12 > neg. CXR 1/16 >   Micro Data:  Blood 1/16 >  Sputum 1/16 >   Antimicrobials:  Zosyn 1/16 >    Interim history/subjective:  Sedated, comfortable.  Objective:  SpO2 98 %.    Vent Mode: PRVC FiO2 (%):  [100 %] 100 % Set  Rate:  [14 bmp] 14 bmp Vt Set:  [550 mL] 550 mL PEEP:  [5 cmH20] 5 cmH20 Plateau Pressure:  [22 cmH20] 22 cmH20  No intake or output data in the 24 hours ending 11/02/18 1917 There were no vitals filed for this visit.  Examination: General: Adult male, in NAD. Neuro: Sedated, does not follow commands. HEENT: [place]/AT. Sclerae anicteric.  ETT in place. Cardiovascular: IRIR, no M/R/G.  Lungs: Respirations even and unlabored.  CTA bilaterally, absent left base (hx lobectomy). Abdomen: BS x 4, soft, NT/ND.  Musculoskeletal: Left hip dressing C/D/I.  No peripheral edema.  Skin: Intact, warm, no rashes.  Assessment & Plan:   Acute hypoxic respiratory failure. - Full vent support. - Assess ABG. - Bronchial hygiene. - Follow CXR.  Presumed aspiration. - Empiric unasyn. - Follow cultures.  Left hip fx - s/p surgical fixation 1/14 (no documentation sent). - Day team to consult ortho. - PT / OT consults once extubated.  Hx COPD. - DuoNebs / Albuterol.  Hx A.fib (on coumadin), HTN, HLD. - Heparin in lieu of preadmission coumadin. - Continue amiodarone for now. - Continue preadmission simvastatin. - Hold preadmission diltiazem  Hx DM. - SSI. - Hold preadmission glipizide, metformin.   Best Practice:  Diet: NPO. Pain/Anxiety/Delirium protocol (if indicated): Propofol gtt / Fentanyl PRN.  RASS goal 0 to -1. VAP protocol (if indicated): In place. DVT prophylaxis: SCD's /  Heparin gtt/ GI prophylaxis: PPI. Glucose control: SSI. Mobility: Bedrest. Code Status: Full. Family Communication: Wife updated. Disposition: ICU.  Labs   CBC: No results for input(s): WBC, NEUTROABS, HGB, HCT, MCV, PLT in the last 168 hours. Basic Metabolic Panel: No results for input(s): NA, K, CL, CO2, GLUCOSE, BUN, CREATININE, CALCIUM, MG, PHOS in the last 168 hours. GFR: CrCl cannot be calculated (Patient's most recent lab result is older than the maximum 21 days allowed.). No results for  input(s): PROCALCITON, WBC, LATICACIDVEN in the last 168 hours. Liver Function Tests: No results for input(s): AST, ALT, ALKPHOS, BILITOT, PROT, ALBUMIN in the last 168 hours. No results for input(s): LIPASE, AMYLASE in the last 168 hours. No results for input(s): AMMONIA in the last 168 hours. ABG No results found for: PHART, PCO2ART, PO2ART, HCO3, TCO2, ACIDBASEDEF, O2SAT  Coagulation Profile: No results for input(s): INR, PROTIME in the last 168 hours. Cardiac Enzymes: No results for input(s): CKTOTAL, CKMB, CKMBINDEX, TROPONINI in the last 168 hours. HbA1C: No results found for: HGBA1C CBG: Recent Labs  Lab 11/02/18 1912  GLUCAP 237*    Review of Systems:   Unable to obtain as pt is encephalopathic.  Past medical history  He,  has a past medical history of Arthritis, Bronchiectasis, Chronic bronchitis, COPD (chronic obstructive pulmonary disease), Essential hypertension, benign, GERD (gastroesophageal reflux disease), History of gout, Hypercholesteremia, Impaired vision, Left atrial enlargement, Memory difficulties, Nephrolithiasis, Obesity, OSA (obstructive sleep apnea), Persistent atrial fibrillation, Recurrent upper respiratory infection (URI), and Type 2 diabetes mellitus.   Surgical History    Past Surgical History:  Procedure Laterality Date  . CARDIAC CATHETERIZATION  6/09   Normal coronaries  . CARDIOVERSION  08/2012   successfully restored to NSR  . CARDIOVERSION  08/26/2012   Procedure: CARDIOVERSION;  Surgeon: Thompson Grayer, MD;  Location: [facility] OR;  Service: Cardiovascular;  Laterality: N/A;  . INCISE AND DRAIN ABCESS  2006   Posterior neck  . Eva SURGERY  2010  . LUNG LOBECTOMY  1959   Left  . REPLACEMENT DISC ANTERIOR LUMBAR SPINE  2011   "put rods in" (08/23/2012)  . TONSILLECTOMY  ~ 1946  . TOTAL HIP ARTHROPLASTY  08/23/2011   Procedure: TOTAL HIP ARTHROPLASTY;  Surgeon: Rudean Haskell, MD;  Location: [place];  Service: Orthopedics;  Laterality: Right;   . URETEROLITHOTOMY     stone removed from ureter     Social History   reports that he has never smoked. He has never used smokeless tobacco. He reports current alcohol use. He reports that he does not use drugs.   Family history   His family history includes Dementia in his father.   Allergies Allergies  Allergen Reactions  . Contrast Media [Iodinated Diagnostic Agents] Rash  . Penicillins Rash  . Quinidine Rash  . Quinine Derivatives Rash    quinidine     Home meds  Prior to Admission medications   Medication Sig Start Date End Date Taking? Authorizing Provider  albuterol (PROVENTIL HFA;VENTOLIN HFA) 108 (90 BASE) MCG/ACT inhaler Inhale 2 puffs into the lungs every 6 (six) hours as needed. For shortness of breath     [provider]  diltiazem (CARDIZEM CD) 180 MG 24 hr capsule Take 1 capsule (180 mg total) by mouth daily. 09/04/12   Allred, Jeneen Rinks, MD  donepezil (ARICEPT) 10 MG tablet Take 10 mg by mouth at bedtime.      [provider]  finasteride (PROSCAR) 5 MG tablet Take 5 mg by  mouth at bedtime.     [provider]  furosemide (LASIX) 20 MG tablet Take 20 mg by mouth daily.  11/27/12   [provider]  glipiZIDE (GLUCOTROL XL) 5 MG 24 hr tablet Take 5 mg by mouth daily.      [provider]  HYDROcodone-acetaminophen (NORCO) 10-325 MG per tablet Take 1 tablet by mouth every 6 (six) hours as needed. For pain.    [provider]  lansoprazole (PREVACID) 15 MG capsule Take 15 mg by mouth 2 (two) times daily.      [provider]  lisinopril (PRINIVIL,ZESTRIL) 10 MG tablet Take 10 mg by mouth at bedtime.     [provider]  loratadine (CLARITIN) 10 MG tablet Take 10 mg by mouth daily.    [provider]  metFORMIN (GLUCOPHAGE) 500 MG tablet Take 250 mg by mouth 2 (two) times daily with a meal.     [provider]  Rivaroxaban (XARELTO) 20 MG TABS Take 1 tablet (20 mg total) by mouth daily  at 8 pm. 11/15/12   Allred, Jeneen Rinks, MD  simvastatin (ZOCOR) 40 MG tablet Take 40 mg by mouth at bedtime.      [provider]  solifenacin (VESICARE) 10 MG tablet Take 10 mg by mouth at bedtime.    [provider]    Critical care time: 35 min.    Montey Hora, [facility] Pager: [phone].  If no answer, [phone] 11/02/2018, 7:17 PM

## 2018-11-02 NOTE — Progress Notes (Signed)
[place] Progress Note Patient Name: [name] [name] DOB: [birth year] MRN: [id number]   Date of Service  11/02/2018  HPI/Events of Note  Afib with good LV function. EF 60-65 %.  eICU Interventions  No indication for pressor currently. Neosynephrine infusion ONLY if MAP persistently < 65 or SBP persistently < 90. Neosynephrine favored due to Afib hx and good LV function.        Kerry Kass Ogan 11/02/2018, 11:33 PM

## 2018-11-02 NOTE — Progress Notes (Signed)
ANTICOAGULATION CONSULT NOTE - Initial Consult  Pharmacy Consult:  Heparin Indication: atrial fibrillation  Allergies  Allergen Reactions  . Contrast Media [Iodinated Diagnostic Agents] Rash  . Penicillins Rash  . Quinidine Rash  . Quinine Derivatives Rash    quinidine    Patient Measurements: Height: 6\' 5"  (195.6 cm) Weight: 278 lb 6.4 oz (126.3 kg) IBW/kg (Calculated) : 89.1 Heparin Dosing Weight: 112 kg  Vital Signs: Temp: 98 F (36.7 C) (01/16 1948) Temp Source: Oral (01/16 1948) BP: 105/79 (01/16 2000) Pulse Rate: 86 (01/16 2000)  Labs: No results for input(s): HGB, HCT, PLT, APTT, LABPROT, INR, HEPARINUNFRC, HEPRLOWMOCWT, CREATININE, CKTOTAL, CKMB, TROPONINI in the last 72 hours.  CrCl cannot be calculated (Patient's most recent lab result is older than the maximum 21 days allowed.).   Medical History: Past Medical History:  Diagnosis Date  . Arthritis   . Bronchiectasis    L lung lobe removed  . Chronic bronchitis   . COPD (chronic obstructive pulmonary disease)   . Essential hypertension, benign   . GERD (gastroesophageal reflux disease)   . History of gout   . Hypercholesteremia   . Impaired vision    Wears contacts or glasses  . Left atrial enlargement    LA size 84mm by echo  . Memory difficulties    Forgetfullness, taking Aricept preventatively, father had Alzheimers  . Nephrolithiasis   . Obesity   . OSA (obstructive sleep apnea)   . Persistent atrial fibrillation   . Recurrent upper respiratory infection (URI)   . Type 2 diabetes mellitus     Assessment: 82 YOM presented to [facility] s/p fall and underwent fixation for hip fracture on 10/31/18.  Patient was on Coumadin for Afib and also started on IV heparin at 1560 units/hr at 1250 prior to transfer.  Pharmacy consulted to continue IV heparin while here and hold Coumadin for now.  Goal of Therapy:  Heparin level 0.3-0.7 units/ml Monitor platelets by anticoagulation protocol: Yes    Plan:   Continue heparin gtt at 1560 units/hr Check heparin level at 2100 Daily heparin level and CBC   Erica Richwine D. Mina Marble, PharmD, BCPS, [place] 11/02/2018, 9:04 PM

## 2018-11-03 ENCOUNTER — Inpatient Hospital Stay (HOSPITAL_COMMUNITY): Payer: Medicare Other

## 2018-11-03 ENCOUNTER — Other Ambulatory Visit: Payer: Self-pay

## 2018-11-03 ENCOUNTER — Encounter (HOSPITAL_COMMUNITY): Payer: Self-pay

## 2018-11-03 DIAGNOSIS — J9621 Acute and chronic respiratory failure with hypoxia: Secondary | ICD-10-CM

## 2018-11-03 DIAGNOSIS — I358 Other nonrheumatic aortic valve disorders: Secondary | ICD-10-CM | POA: Diagnosis not present

## 2018-11-03 DIAGNOSIS — G253 Myoclonus: Secondary | ICD-10-CM

## 2018-11-03 DIAGNOSIS — I4891 Unspecified atrial fibrillation: Secondary | ICD-10-CM | POA: Diagnosis not present

## 2018-11-03 DIAGNOSIS — I517 Cardiomegaly: Secondary | ICD-10-CM | POA: Diagnosis not present

## 2018-11-03 LAB — CBC
HCT: 35.6 % — ABNORMAL LOW (ref 39.0–52.0)
Hemoglobin: 10.9 g/dL — ABNORMAL LOW (ref 13.0–17.0)
MCH: 27.1 pg (ref 26.0–34.0)
MCHC: 30.6 g/dL (ref 30.0–36.0)
MCV: 88.6 fL (ref 80.0–100.0)
Platelets: 105 10*3/uL — ABNORMAL LOW (ref 150–400)
RBC: 4.02 MIL/uL — ABNORMAL LOW (ref 4.22–5.81)
RDW: 17.4 % — AB (ref 11.5–15.5)
WBC: 17.2 10*3/uL — ABNORMAL HIGH (ref 4.0–10.5)
nRBC: 0.4 % — ABNORMAL HIGH (ref 0.0–0.2)

## 2018-11-03 LAB — BLOOD GAS, ARTERIAL
Acid-base deficit: 7.4 mmol/L — ABNORMAL HIGH (ref 0.0–2.0)
Bicarbonate: 17.5 mmol/L — ABNORMAL LOW (ref 20.0–28.0)
Drawn by: 41422
FIO2: 40
MECHVT: 670 mL
O2 Saturation: 94.8 %
PEEP/CPAP: 5 cmH2O
Patient temperature: 98.2
RATE: 18 resp/min
pCO2 arterial: 33.9 mmHg (ref 32.0–48.0)
pH, Arterial: 7.331 — ABNORMAL LOW (ref 7.350–7.450)
pO2, Arterial: 79.9 mmHg — ABNORMAL LOW (ref 83.0–108.0)

## 2018-11-03 LAB — BASIC METABOLIC PANEL WITH GFR
Anion gap: 12 (ref 5–15)
BUN: 48 mg/dL — ABNORMAL HIGH (ref 8–23)
CO2: 17 mmol/L — ABNORMAL LOW (ref 22–32)
Calcium: 7.6 mg/dL — ABNORMAL LOW (ref 8.9–10.3)
Chloride: 108 mmol/L (ref 98–111)
Creatinine, Ser: 2.36 mg/dL — ABNORMAL HIGH (ref 0.61–1.24)
GFR calc Af Amer: 30 mL/min — ABNORMAL LOW
GFR calc non Af Amer: 26 mL/min — ABNORMAL LOW
Glucose, Bld: 241 mg/dL — ABNORMAL HIGH (ref 70–99)
Potassium: 4.5 mmol/L (ref 3.5–5.1)
Sodium: 137 mmol/L (ref 135–145)

## 2018-11-03 LAB — GLUCOSE, CAPILLARY
GLUCOSE-CAPILLARY: 230 mg/dL — AB (ref 70–99)
Glucose-Capillary: 228 mg/dL — ABNORMAL HIGH (ref 70–99)
Glucose-Capillary: 186 mg/dL — ABNORMAL HIGH (ref 70–99)
Glucose-Capillary: 149 mg/dL — ABNORMAL HIGH (ref 70–99)
Glucose-Capillary: 174 mg/dL — ABNORMAL HIGH (ref 70–99)
Glucose-Capillary: 203 mg/dL — ABNORMAL HIGH (ref 70–99)

## 2018-11-03 LAB — MAGNESIUM: Magnesium: 1.7 mg/dL (ref 1.7–2.4)

## 2018-11-03 LAB — HEPARIN LEVEL (UNFRACTIONATED): Heparin Unfractionated: 0.53 IU/mL (ref 0.30–0.70)

## 2018-11-03 LAB — PHOSPHORUS: Phosphorus: 4.6 mg/dL (ref 2.5–4.6)

## 2018-11-03 MED ORDER — HEPARIN (PORCINE) 25000 UT/250ML-% IV SOLN
2250.0000 [IU]/h | INTRAVENOUS | Status: DC
  Administered 2018-11-03 (×2): 1550 [IU]/h via INTRAVENOUS
  Administered 2018-11-04: 1750 [IU]/h via INTRAVENOUS
  Administered 2018-11-05 – 2018-11-08 (×8): 2100 [IU]/h via INTRAVENOUS
  Filled 2018-11-03 (×13): qty 250

## 2018-11-03 MED ORDER — MAGNESIUM SULFATE 2 GM/50ML IV SOLN
2.0000 g | Freq: Once | INTRAVENOUS | Status: AC
  Administered 2018-11-03: 2 g via INTRAVENOUS
  Filled 2018-11-03: qty 50

## 2018-11-03 MED ORDER — INSULIN GLARGINE 100 UNIT/ML ~~LOC~~ SOLN
5.0000 [IU] | Freq: Every day | SUBCUTANEOUS | Status: DC
  Administered 2018-11-03 – 2018-11-05 (×3): 5 [IU] via SUBCUTANEOUS
  Filled 2018-11-03 (×3): qty 0.05

## 2018-11-03 MED ORDER — LORAZEPAM 2 MG/ML IJ SOLN
0.5000 mg | INTRAMUSCULAR | Status: DC | PRN
  Administered 2018-11-03: 1 mg via INTRAVENOUS

## 2018-11-03 MED ORDER — CHLORHEXIDINE GLUCONATE 0.12% ORAL RINSE (MEDLINE KIT)
15.0000 mL | Freq: Two times a day (BID) | OROMUCOSAL | Status: DC
  Administered 2018-11-03 – 2018-11-05 (×7): 15 mL via OROMUCOSAL

## 2018-11-03 MED ORDER — PRO-STAT SUGAR FREE PO LIQD
60.0000 mL | Freq: Two times a day (BID) | ORAL | Status: DC
  Administered 2018-11-03 – 2018-11-05 (×5): 60 mL
  Filled 2018-11-03 (×5): qty 60

## 2018-11-03 MED ORDER — LORAZEPAM 2 MG/ML IJ SOLN
INTRAMUSCULAR | Status: AC
  Administered 2018-11-03: 1 mg via INTRAVENOUS
  Filled 2018-11-03: qty 1

## 2018-11-03 MED ORDER — VITAL HIGH PROTEIN PO LIQD
1000.0000 mL | ORAL | Status: DC
  Administered 2018-11-03 – 2018-11-05 (×4): 1000 mL

## 2018-11-03 MED ORDER — ORAL CARE MOUTH RINSE
15.0000 mL | OROMUCOSAL | Status: DC
  Administered 2018-11-03 – 2018-11-06 (×30): 15 mL via OROMUCOSAL

## 2018-11-03 MED FILL — Norepinephrine-Dextrose IV Solution 4 MG/250ML-5%: INTRAVENOUS | Qty: 250 | Status: AC

## 2018-11-03 MED FILL — Heparin Sod (Porcine) in NaCl IV Soln 25000 Unit/250ML-0.45%: INTRAVENOUS | Qty: 250 | Status: AC

## 2018-11-03 NOTE — Progress Notes (Signed)
ANTICOAGULATION CONSULT NOTE  Pharmacy Consult:  Heparin Indication: atrial fibrillation  Allergies  Allergen Reactions  . Contrast Media [Iodinated Diagnostic Agents] Rash  . Penicillins Rash  . Quinidine Rash  . Quinine Derivatives Rash    quinidine    Patient Measurements: Height: 6\' 3"  (190.5 cm) Weight: 280 lb 3.3 oz (127.1 kg) IBW/kg (Calculated) : 84.5 Heparin Dosing Weight: 112 kg  Vital Signs: Temp: 98.2 F (36.8 C) (01/17 0407) Temp Source: Oral (01/17 0407) BP: 100/66 (01/17 0545) Pulse Rate: 80 (01/17 0545)  Labs: Recent Labs    11/02/18 2034 11/02/18 2148 11/03/18 0540  HGB 8.4*  --   --   HCT 26.4*  --   --   PLT 81*  --   --   LABPROT 18.1*  --   --   INR 1.52  --   --   HEPARINUNFRC  --   --  0.53  CREATININE  --  2.51*  --     Estimated Creatinine Clearance: 35.9 mL/min (A) (by C-G formula based on SCr of 2.51 mg/dL (H)).   Assessment: 78 y.o. male with Afib, Coumadin on hold, for heparin  Goal of Therapy:  Heparin level 0.3-0.7 units/ml Monitor platelets by anticoagulation protocol: Yes   Plan:   Continue Heparin 1550 units/hr  Phillis Knack, PharmD, BCPS  11/03/2018, 6:29 AM

## 2018-11-03 NOTE — Progress Notes (Signed)
Initial Nutrition Assessment  DOCUMENTATION CODES:   Obesity unspecified  INTERVENTION:  - If patient to remain intubated >/= 24 hours, recommend Vital High Protein @ 50 ml/hr with 60 ml Prostat BID. This regimen will provide 1600 kcal, 165 grams of protein (93% estimated protein need), and 1003 ml free water.   NUTRITION DIAGNOSIS:   Inadequate oral intake related to inability to eat as evidenced by NPO status.  GOAL:   Provide needs based on ASPEN/SCCM guidelines  MONITOR:   Vent status, Weight trends, Labs, Skin, I & O's  REASON FOR ASSESSMENT:   Ventilator  ASSESSMENT:   78 y.o. male who was admitted to [facility] on 1/12 with left hip fx, underwent surgical fixation 1/14.  Had probable aspiration and required re-intubation shortly following extubation post op. Transferred to [facility] 1/16 for further eval and management.  Patient is intubated with OGT in place to LIS with <50 ml output this shift. Wife is at bedside and reports all information. She reports that prior to fall and L hip surgery patient had a very good appetite with no recent changes, no feelings of early satiety, discomfort/pain with eating or drinking, or feeling nauseated with PO intakes. She states "he has an iron stomach."  Current weight is 280 lb and wife denies any recent weight changes. She reports his UBW is 265-270 lb. No weight in chart since 2014. Estimated kcal need based on admission weight (117 kg) given this is close to UBW and patient currently receiving high rate IVF.   Patient is currently intubated on ventilator support MV: 15.3 L/min Temp (24hrs), Avg:98 F (36.7 C), Min:97.7 F (36.5 C), Max:98.2 F (36.8 C) Propofol: none BP: 113/72 and MAP: 82  Medications reviewed; sliding scale novolog, 40 mg IV protonix/day. Labs reviewed; CBGs: 228 and 186 mg/dl today, BUN: 48 mg/dl, creatinine: 2.36 mg/dl, Ca: 7.6 mg/dl, GFR: 26 ml/min. IVF; NS @ 100 ml/hr. Drip; heparin @ 1150 units/hr, amiodarone  @ 30 mg/hr.     NUTRITION - FOCUSED PHYSICAL EXAM:  Completed; no muscle and no fat wasting.  Diet Order:   Diet Order            Diet NPO time specified  Diet effective now              EDUCATION NEEDS:   No education needs have been identified at this time  Skin:  Skin Assessment: Skin Integrity Issues: Skin Integrity Issues:: Incisions Incisions: L hip (11/02/18)  Last BM:  PTA/unknown  Height:   Ht Readings from Last 1 Encounters:  11/02/18 6\' 3"  (1.905 m)    Weight:   Wt Readings from Last 1 Encounters:  11/03/18 127.1 kg    Ideal Body Weight:  89.09 kg  BMI:  Body mass index is 35.02 kg/m.  Estimated Nutritional Needs:   Kcal:  9470-9628 kcal  Protein:  >/= 178 grams (2 grams/kg IBW)  Fluid:  >/= 1.8 L/day     Jarome Matin, MS, RD, LDN, [facility] Inpatient Clinical Dietitian Pager # [phone] After hours/weekend pager # [phone]

## 2018-11-03 NOTE — Progress Notes (Signed)
[place] Progress Note Patient Name: [name] [name] DOB: Mar 07, 1941 MRN: [id number]   Date of Service  11/03/2018  HPI/Events of Note  Pt with myoclonic jerking type movements of his right arm to stimulation only with prompt resolution once the extremity is at rest. Pt alert and interactive during the movements. Movements were not extinguished by 1 mg of Ativan.  eICU Interventions  Check spot EEG, check ionized calcium, mg, Phosphorus, BMET, prn empiric Ativan pending results of EEG        Leocadio Heal U Hlee Fringer 11/03/2018, 4:19 AM

## 2018-11-03 NOTE — Procedures (Signed)
History: 78 year old male being evaluated for myoclonic jerking  Sedation: None,   Technique: This is a 21 channel routine scalp EEG performed at the bedside with bipolar and monopolar montages arranged in accordance to the international 10/20 system of electrode placement. One channel was dedicated to EKG recording.    Background: The background is moderately disorganized, with diffuse generalized irregular delta and theta activities, but there is a posterior dominant rhythm of 8 Hz which is seen.  In addition, there are frequent discharges with a shifting bifrontal predominance, anterior posterior lag, that attenuates with sleep and become much more prominent with arousal.  No epileptiform discharges are seen.  Photic stimulation: Physiologic driving is not performed  EEG Abnormalities: 1) triphasic waves 2) generalized irregular slow activity  Clinical Interpretation: This EEG is consistent with a generalized nonspecific cerebral dysfunction (encephalopathy). There was no seizure or seizure predisposition recorded on this study. Please note that lack of epileptiform activity on EEG does not preclude the possibility of epilepsy.   Roland Rack, MD [facility] [phone]  If 7pm- 7am, please page neurology on call as listed in [place].

## 2018-11-03 NOTE — Progress Notes (Signed)
Patient with a hip hemiarthroplasty done by Dr. Case at outside facility. I was contacted yesterday to be aware of patient to help with ortho care if needed during hospital stay [facility]. [facility] for management of [facility] failure. Ok for anticoagulation from our standpoint. Plan to WBAT one capable from a medical standpoint.   Please call with additional questions.   Marchia Bond, MD [phone] cell [facility] call group (if you don't reach me on my cell).

## 2018-11-03 NOTE — Progress Notes (Signed)
EEG Completed; Results Pending  

## 2018-11-03 NOTE — Progress Notes (Signed)
NUTRITION NOTE  Spoke with RN who reports that in rounds it was stated that it is okay to start TF for patient.   Will order TF via recommendations in note by this RD earlier this AM: Vital High Protein @ 50 ml/hr with 60 ml Prostat BID. This regimen will provide 1600 kcal, 165 grams of protein (93% estimated protein need), and 1003 ml free water.    Jarome Matin, MS, RD, LDN, [facility] Inpatient Clinical Dietitian Pager # [phone] After hours/weekend pager # [phone]

## 2018-11-03 NOTE — Progress Notes (Signed)
Spoke with Dr. Cheral Marker about the CT results with the possible communicating hydrocephalus. He reviewed the CT scan and he stated that he does not feel like the CT results show a hydrocephalus and that it appears to be mostly just atrophy. Will not consult neurology at this time.

## 2018-11-03 NOTE — Progress Notes (Signed)
NAME:  [name] [name], MRN:  [id number], DOB:  [birth year], LOS: 1 ADMISSION DATE:  11/02/2018, CONSULTATION DATE:  11/02/18 REFERRING MD:  Benson Norway  CHIEF COMPLAINT:  Left Hip Fx   Brief History   This is a 78 year old male with a history of Afib on coumain, COPD s/p LLL pneumonectomy, OSA, DM, HTN, HLD, GERD, gout, and memory difficulties with recent surgical fixation of the left femoral neck at [facility] who was extubated in the OR but required re-intubation for hypoxia and increased WOB. Transferred to [facility] for further management.   History of present illness   Pt is encephelopathic; therefore, this HPI is obtained from chart review. [name] [name] is a 78 y.o. male who has a PMH as outlined below (see "past medical history").  He presented to [facility] ED 1/12 after a fall in his driveway.  He complained of left hip pain and was found to have a left femoral neck fracture.  He was held in ED there while awaiting at bed at [facility].  There was no bed availability at [facility]; therefore, he was admitted to [facility] and underwent surgical fixation 1/14.  Per his wife, he was extubated in the OR; however, required reintubation shortly thereafter due to hypoxia and increased WOB.  There was talk of possible extubation 1/16; however, wife states they were told that he might have aspirated or be developing PNA; therefore, he remained intubated and was transferred to [facility] later that evening.  Past Medical History   Past Medical History:  Diagnosis Date  . Arthritis   . Bronchiectasis    L lung lobe removed  . Chronic bronchitis   . COPD (chronic obstructive pulmonary disease)   . Essential hypertension, benign   . GERD (gastroesophageal reflux disease)   . History of gout   . Hypercholesteremia   . Impaired vision    Wears contacts or glasses  . Left atrial enlargement    LA size 52mm by echo  . Memory difficulties    Forgetfullness, taking Aricept preventatively, father had Alzheimers  .  Nephrolithiasis   . Obesity   . OSA (obstructive sleep apnea)   . Persistent atrial fibrillation   . Recurrent upper respiratory infection (URI)   . Type 2 diabetes mellitus      Significant Hospital Events   1/12 > presented to [facility] 1/14 > left hip surgery 1/16 > transferred to [facility].  Consults:  Ortho  Procedures:  1/14 > left hip surgery  Significant Diagnostic Tests:  CT head 1/12 > negative  CXR  1/16 > Opacification of the left hemithorax  Micro Data:  Blood cultures 1/16 > Sputum cultures 1/16 >  Antimicrobials:  Zosyn 1/16 >   Rocephin 1/16 > Flagyl 1/16 > 1/17  Interim history/subjective:  Patient is currently intubated. No family at baseline.  Overnight the nurse noted a tremor of his right upper extremity, he was given 1 dose of ativan and his symptoms did not improve. Stat EEG was ordered and is still pending. Unclear what his baseline is.  Wife was at bedside, she reports that he has never had a history of seizures and that he started having right arm jerking after his fall. CT head at [facility] was negative.   Objective   Blood pressure 108/60, pulse 82, temperature 98.2 F (36.8 C), temperature source Oral, resp. rate (!) 21, height 6\' 3"  (1.905 m), weight 127.1 kg, SpO2 96 %.    Vent Mode: PRVC FiO2 (%):  [  40 %-100 %] 40 % Set Rate:  [14 bmp-18 bmp] 18 bmp Vt Set:  [550 mL-670 mL] 670 mL PEEP:  [5 cmH20-8 cmH20] 5 cmH20 Plateau Pressure:  [15 cmH20-23 cmH20] 23 cmH20   Intake/Output Summary (Last 24 hours) at 11/03/2018 0727 Last data filed at 11/03/2018 0600 Gross per 24 hour  Intake 1756.58 ml  Output 285 ml  Net 1471.58 ml   Filed Weights   11/02/18 2000 11/02/18 2300 11/03/18 0454  Weight: 126.3 kg 125 kg 127.1 kg    Examination: General: Intubated, sedated, not following commands HENT: Normocephalic, atraumatic Lungs: Bilateral rhonchi, decreased breath sounds over left lung base.  Cardiovascular: Irregular rhythm, regular rate, no  m/r/g Abdomen: soft, no guarding, constant tremor Extremities: Left hip in bandage, right lower extremity warm and dry, trace edema bilaterally Neuro: Sedated, right upper extremity tremor with movement GU: Foley in place  Resolved Hospital Problem list     Assessment & Plan:   Acute hypoxic and hypercapnic respiratory failure COPD, s/p LLL pneumonectomy, OSA Possible aspiration: Bronchiectasis: He is no longer requiring pressors, no current sedation however patient is only intermittiently following commands. Was placed on pressure support while I was in the room. It's possible that he still has some sedative medications in his system causing him to have a decreased mental status however I would like to see some improvement in his mental status prior to considering extubation. His leukocytosis has improved and he has remained afebrile.  PRVC, FiO2 40, PEEP 5, RR 18, TV 670 -CXR showed good placement, opacification of the left hemithorax -Weaned off propofol  -Received 50 mg fentanyl this morning -F/u EEG -Daily SBT -Sedation vacation daily -Continue duonebs -Continue azithromycin and rocephin -Discontinue flagyl -F/u sputum cultures -Daily CBC -Chest PT and Suctioning  Left hip surgery: -Performed at [facility], no records present, will need to obtain records to see what was done -Dr. Case is the orthopedic who performed the surgery, will contact him to see what follow up needs to be done  Acute vs chronic kidney injury: -Cr today was 2.36, was normal in 2013. Wife reported that she does not think he had any kidney issues. It's unclear if this is new or chronic. This may be related to a pre-renal cause and he is currently on maintenance fluids.  -Obtain labs from [facility], if this is new we will obtain urine Na, urine Cr, U/A and renal U/S -BMP in AM  Thrombocytopenia: -Platelets today was 105, 89 on admission. It appears that it was around this level in 2013. Will  continue to monitor. No signs of active bleeding, Hgb has been stable.  -Daily CBC  Afib: -Currently  rate controlled -Continue amiodarone gtt -Continue heparin gtt  H/o hypomagnesemia -Mag today is 1.7 -Replete as needed  BPH: -Patient is on proscar and vesicare at home.  -Currently has indwelling catheter -Had decreased urinary output  Diabetes mellitus: -On SSI -CBGs have been elevated around 240s -Add Lantus 5 units  -Frequent CBGs  Memory issues/ dementia -Continue aricept via OGT  Best practice:  Diet: NPO. Pain/Anxiety/Delirium protocol (if indicated): Propofol gtt / Fentanyl PRN.  RASS goal 0 to -1. VAP protocol (if indicated): In place. DVT prophylaxis: SCD's / Heparin gtt/ GI prophylaxis: PPI. Glucose control: SSI. Mobility: Bedrest. Code Status: Full. Family Communication: Discussed with wife Disposition: ICU   Labs   CBC: Recent Labs  Lab 11/02/18 2034 11/03/18 0540  WBC 21.6* 17.2*  HGB 8.4* 10.9*  HCT 26.4* 35.6*  MCV 91.3 88.6  PLT 81* 105*    Basic Metabolic Panel: Recent Labs  Lab 11/02/18 2148 11/03/18 0540  NA 138 137  K 5.1 4.5  CL 106 108  CO2 18* 17*  GLUCOSE 270* 241*  BUN 42* 48*  CREATININE 2.51* 2.36*  CALCIUM 7.9* 7.6*  MG 1.8 1.7  PHOS 4.8* 4.6   GFR: Estimated Creatinine Clearance: 38.2 mL/min (A) (by C-G formula based on SCr of 2.36 mg/dL (H)). Recent Labs  Lab 11/02/18 2034 11/03/18 0540  WBC 21.6* 17.2*    Liver Function Tests: Recent Labs  Lab 11/02/18 2148  AST 143*  ALT 85*  ALKPHOS 55  BILITOT 1.4*  PROT 5.4*  ALBUMIN 2.4*   No results for input(s): LIPASE, AMYLASE in the last 168 hours. No results for input(s): AMMONIA in the last 168 hours.  ABG    Component Value Date/Time   PHART 7.331 (L) 11/03/2018 0432   PCO2ART 33.9 11/03/2018 0432   PO2ART 79.9 (L) 11/03/2018 0432   HCO3 17.5 (L) 11/03/2018 0432   TCO2 22 11/02/2018 2008   ACIDBASEDEF 7.4 (H) 11/03/2018 0432   O2SAT 94.8  11/03/2018 0432     Coagulation Profile: Recent Labs  Lab 11/02/18 2034  INR 1.52    Cardiac Enzymes: No results for input(s): CKTOTAL, CKMB, CKMBINDEX, TROPONINI in the last 168 hours.  HbA1C: No results found for: HGBA1C  CBG: Recent Labs  Lab 11/02/18 1912 11/02/18 2342 11/03/18 0406  GLUCAP 237* 270* 228*    Review of Systems:   Patient is currently sedated and intubated, unable to obtain.  Past Medical History  He,  has a past medical history of Arthritis, Bronchiectasis, Chronic bronchitis, COPD (chronic obstructive pulmonary disease), Essential hypertension, benign, GERD (gastroesophageal reflux disease), History of gout, Hypercholesteremia, Impaired vision, Left atrial enlargement, Memory difficulties, Nephrolithiasis, Obesity, OSA (obstructive sleep apnea), Persistent atrial fibrillation, Recurrent upper respiratory infection (URI), and Type 2 diabetes mellitus.   Surgical History    Past Surgical History:  Procedure Laterality Date  . CARDIAC CATHETERIZATION  6/09   Normal coronaries  . CARDIOVERSION  08/2012   successfully restored to NSR  . CARDIOVERSION  08/26/2012   Procedure: CARDIOVERSION;  Surgeon: Thompson Grayer, MD;  Location: [facility] OR;  Service: Cardiovascular;  Laterality: N/A;  . INCISE AND DRAIN ABCESS  2006   Posterior neck  . [place] SURGERY  2010  . LUNG LOBECTOMY  1959   Left  . REPLACEMENT DISC ANTERIOR LUMBAR SPINE  2011   "put rods in" (08/23/2012)  . TONSILLECTOMY  ~ 1946  . TOTAL HIP ARTHROPLASTY  08/23/2011   Procedure: TOTAL HIP ARTHROPLASTY;  Surgeon: Rudean Haskell, MD;  Location: [place];  Service: Orthopedics;  Laterality: Right;  . URETEROLITHOTOMY     stone removed from ureter     Social History   reports that he has never smoked. He has never used smokeless tobacco. He reports current alcohol use. He reports that he does not use drugs.   Family History   His family history includes Dementia in his father.    Allergies Allergies  Allergen Reactions  . Contrast Media [Iodinated Diagnostic Agents] Rash  . Penicillins Rash  . Quinidine Rash  . Quinine Derivatives Rash    quinidine     Home Medications  Prior to Admission medications   Medication Sig Start Date End Date Taking? Authorizing Provider  albuterol (PROVENTIL HFA;VENTOLIN HFA) 108 (90 BASE) MCG/ACT inhaler Inhale 2 puffs into the lungs every  6 (six) hours as needed. For shortness of breath     [provider]  diltiazem (CARDIZEM CD) 180 MG 24 hr capsule Take 1 capsule (180 mg total) by mouth daily. 09/04/12   Allred, Jeneen Rinks, MD  donepezil (ARICEPT) 10 MG tablet Take 10 mg by mouth at bedtime.      [provider]  finasteride (PROSCAR) 5 MG tablet Take 5 mg by mouth at bedtime.     [provider]  furosemide (LASIX) 20 MG tablet Take 20 mg by mouth daily.  11/27/12   [provider]  glipiZIDE (GLUCOTROL XL) 5 MG 24 hr tablet Take 5 mg by mouth daily.      [provider]  HYDROcodone-acetaminophen (NORCO) 10-325 MG per tablet Take 1 tablet by mouth every 6 (six) hours as needed. For pain.    [provider]  lansoprazole (PREVACID) 15 MG capsule Take 15 mg by mouth 2 (two) times daily.      [provider]  lisinopril (PRINIVIL,ZESTRIL) 10 MG tablet Take 10 mg by mouth at bedtime.     [provider]  loratadine (CLARITIN) 10 MG tablet Take 10 mg by mouth daily.    [provider]  metFORMIN (GLUCOPHAGE) 500 MG tablet Take 250 mg by mouth 2 (two) times daily with a meal.     [provider]  Rivaroxaban (XARELTO) 20 MG TABS Take 1 tablet (20 mg total) by mouth daily at 8 pm. 11/15/12   Allred, Jeneen Rinks, MD  simvastatin (ZOCOR) 40 MG tablet Take 40 mg by mouth at bedtime.      [provider]  solifenacin (VESICARE) 10 MG tablet Take 10 mg by mouth at bedtime.    [provider]        Asencion Noble, M.D. PGY1 Pager  [phone] 11/03/2018 8:17 AM

## 2018-11-03 NOTE — Progress Notes (Signed)
Jerking/shivering of right arm when stimulated noticed by this RN, Dr. Lucile Shutters notified, 1 mg ativan given per MD order, watching for change in jerking before, during, and after dose given. No change noted in jerking movement after ativan given. MD observed at that time. Will continue to monitor closely.

## 2018-11-04 ENCOUNTER — Inpatient Hospital Stay (HOSPITAL_COMMUNITY): Payer: Medicare Other

## 2018-11-04 LAB — BASIC METABOLIC PANEL
Anion gap: 12 (ref 5–15)
BUN: 45 mg/dL — ABNORMAL HIGH (ref 8–23)
CO2: 21 mmol/L — ABNORMAL LOW (ref 22–32)
Calcium: 8.2 mg/dL — ABNORMAL LOW (ref 8.9–10.3)
Chloride: 108 mmol/L (ref 98–111)
Creatinine, Ser: 1.22 mg/dL (ref 0.61–1.24)
GFR calc Af Amer: >60 mL/min (ref >60)
GFR calc non Af Amer: 57 mL/min — ABNORMAL LOW (ref >60)
GLUCOSE: 291 mg/dL — AB (ref 70–99)
Potassium: 4.1 mmol/L (ref 3.5–5.1)
Sodium: 141 mmol/L (ref 135–145)

## 2018-11-04 LAB — COMPREHENSIVE METABOLIC PANEL
ALT: 127 U/L — ABNORMAL HIGH (ref 0–44)
AST: 94 U/L — AB (ref 15–41)
Albumin: 1.9 g/dL — ABNORMAL LOW (ref 3.5–5.0)
Alkaline Phosphatase: 59 U/L (ref 38–126)
Anion gap: 11 (ref 5–15)
BILIRUBIN TOTAL: 0.7 mg/dL (ref 0.3–1.2)
BUN: 50 mg/dL — ABNORMAL HIGH (ref 8–23)
CO2: 19 mmol/L — ABNORMAL LOW (ref 22–32)
Calcium: 7.8 mg/dL — ABNORMAL LOW (ref 8.9–10.3)
Chloride: 110 mmol/L (ref 98–111)
Creatinine, Ser: 1.44 mg/dL — ABNORMAL HIGH (ref 0.61–1.24)
GFR calc Af Amer: 54 mL/min — ABNORMAL LOW (ref >60)
GFR calc non Af Amer: 47 mL/min — ABNORMAL LOW (ref >60)
GLUCOSE: 263 mg/dL — AB (ref 70–99)
Potassium: 3.9 mmol/L (ref 3.5–5.1)
Sodium: 140 mmol/L (ref 135–145)
Total Protein: 5.1 g/dL — ABNORMAL LOW (ref 6.5–8.1)

## 2018-11-04 LAB — HEPARIN LEVEL (UNFRACTIONATED)
Heparin Unfractionated: 0.14 IU/mL — ABNORMAL LOW (ref 0.30–0.70)
Heparin Unfractionated: 0.12 IU/mL — ABNORMAL LOW (ref 0.30–0.70)

## 2018-11-04 LAB — GLUCOSE, CAPILLARY
Glucose-Capillary: 225 mg/dL — ABNORMAL HIGH (ref 70–99)
Glucose-Capillary: 235 mg/dL — ABNORMAL HIGH (ref 70–99)
Glucose-Capillary: 244 mg/dL — ABNORMAL HIGH (ref 70–99)
Glucose-Capillary: 251 mg/dL — ABNORMAL HIGH (ref 70–99)
Glucose-Capillary: 254 mg/dL — ABNORMAL HIGH (ref 70–99)
Glucose-Capillary: 255 mg/dL — ABNORMAL HIGH (ref 70–99)

## 2018-11-04 MED ORDER — FENTANYL CITRATE (PF) 100 MCG/2ML IJ SOLN
50.0000 ug | INTRAMUSCULAR | Status: DC | PRN
  Administered 2018-11-05 (×2): 50 ug via INTRAVENOUS
  Filled 2018-11-04 (×2): qty 2

## 2018-11-04 MED ORDER — FUROSEMIDE 10 MG/ML IJ SOLN
40.0000 mg | Freq: Every day | INTRAMUSCULAR | Status: AC
  Administered 2018-11-04 – 2018-11-06 (×3): 40 mg via INTRAVENOUS
  Filled 2018-11-04 (×3): qty 4

## 2018-11-04 MED ORDER — DILTIAZEM 12 MG/ML ORAL SUSPENSION
45.0000 mg | Freq: Four times a day (QID) | ORAL | Status: DC
  Administered 2018-11-04 – 2018-11-10 (×14): 45 mg
  Filled 2018-11-04 (×27): qty 6

## 2018-11-04 MED ORDER — FAMOTIDINE 40 MG/5ML PO SUSR
20.0000 mg | Freq: Every day | ORAL | Status: DC
  Administered 2018-11-04 – 2018-11-05 (×2): 20 mg
  Filled 2018-11-04 (×3): qty 2.5

## 2018-11-04 NOTE — Progress Notes (Signed)
[place] for Heparin Indication: atrial fibrillation  Allergies  Allergen Reactions  . Contrast Media [Iodinated Diagnostic Agents] Rash  . Penicillins Rash    Did it involve swelling of the face/tongue/throat, SOB, or low BP? Unknown Did it involve sudden or severe rash/hives, skin peeling, or any reaction on the inside of your mouth or nose? Unknown Did you need to seek medical attention at a hospital or doctor's office? Unknown When did it last happen? If all above answers are "NO", may proceed with cephalosporin use.   . Quinidine Rash  . Quinine Derivatives Rash    quinidine  . Sulfa Antibiotics Rash   Patient Measurements: Height: _0  (190.5 cm) Weight: 281 lb 4.9 oz (127.6 kg) IBW/kg (Calculated) : 84.5 Heparin Dosing Weight: 112 kg  Vital Signs: Temp: 99.2 F (37.3 C) (01/18 1518) Temp Source: Axillary (01/18 1518) BP: 129/79 (01/18 1500) Pulse Rate: 78 (01/18 1500)  Labs: Recent Labs    11/02/18 2034 11/02/18 2148 11/03/18 0540 11/04/18 0401 11/04/18 1425  HGB 8.4*  --  10.9*  --   --   HCT 26.4*  --  35.6*  --   --   PLT 81*  --  105*  --   --   LABPROT 18.1*  --   --   --   --   INR 1.52  --   --   --   --   HEPARINUNFRC  --   --  0.53 0.14* 0.12*  CREATININE  --  2.51* 2.36* 1.44*  --     Estimated Creatinine Clearance: 62.8 mL/min (A) (by C-G formula based on SCr of 1.44 mg/dL (H)).  Assessment: 51 yoM presented to [facility] s/p fall and underwent fixation for hip fracture on 10/31/18, required re-intubation in the PACU and was transferred to [facility] for further management. PMH s/f afib on warfarin PTA. Patient was started on IV heparin at 1560 units/hr at OSH prior to transfer. Pharmacy consulted to continue dosing IV heparin. Heparin level this afternoon is still below goal at 0.12 despite rate increase overnight. No infusion issues or interruptions confirmed with RN. CBC not drawn this AM. No bleeding  noted.  Goal of Therapy:  Heparin level 0.3-0.7 units/ml Monitor platelets by anticoagulation protocol: Yes   Plan:   Increase heparin gtt to 2100 units/hrr Re-check heparin level in 8 hours Daily heparin level and CBC Monitor for s/sx of bleeding  Narda Bonds, PharmD, BCPS Clinical Pharmacist Phone: [phone]

## 2018-11-04 NOTE — Progress Notes (Signed)
ANTICOAGULATION CONSULT NOTE  Pharmacy Consult:  Heparin Indication: atrial fibrillation  Allergies  Allergen Reactions  . Contrast Media [Iodinated Diagnostic Agents] Rash  . Penicillins Rash    Did it involve swelling of the face/tongue/throat, SOB, or low BP? Unknown Did it involve sudden or severe rash/hives, skin peeling, or any reaction on the inside of your mouth or nose? Unknown Did you need to seek medical attention at a hospital or doctor's office? Unknown When did it last happen? If all above answers are "NO", may proceed with cephalosporin use.   . Quinidine Rash  . Quinine Derivatives Rash    quinidine  . Sulfa Antibiotics Rash    Patient Measurements: Height: 6\' 3"  (190.5 cm) Weight: 281 lb 4.9 oz (127.6 kg) IBW/kg (Calculated) : 84.5 Heparin Dosing Weight: 112 kg  Vital Signs: Temp: 99.1 F (37.3 C) (01/18 0311) Temp Source: Oral (01/18 0311) BP: 137/84 (01/18 0600) Pulse Rate: 92 (01/18 0600)  Labs: Recent Labs    11/02/18 2034 11/02/18 2148 11/03/18 0540 11/04/18 0401  HGB 8.4*  --  10.9*  --   HCT 26.4*  --  35.6*  --   PLT 81*  --  105*  --   LABPROT 18.1*  --   --   --   INR 1.52  --   --   --   HEPARINUNFRC  --   --  0.53 0.14*  CREATININE  --  2.51* 2.36* 1.44*    Estimated Creatinine Clearance: 62.8 mL/min (A) (by C-G formula based on SCr of 1.44 mg/dL (H)).   Medical History: Past Medical History:  Diagnosis Date  . Arthritis   . Bronchiectasis    L lung lobe removed  . Chronic bronchitis ([place])   . COPD (chronic obstructive pulmonary disease) ([place])   . Essential hypertension, benign   . GERD (gastroesophageal reflux disease)   . History of gout   . Hypercholesteremia   . Impaired vision    Wears contacts or glasses  . Left atrial enlargement    LA size 28mm by echo  . Memory difficulties    Forgetfullness, taking Aricept preventatively, father had Alzheimers  . Nephrolithiasis   . Obesity   . OSA (obstructive sleep  apnea)   . Persistent atrial fibrillation   . Recurrent upper respiratory infection (URI)   . Type 2 diabetes mellitus ([place])     Assessment: 77 YOM presented to [facility] s/p fall and underwent fixation for hip fracture on 10/31/18.  Patient was on Coumadin for Afib and also started on IV heparin at 1560 units/hr at 1250 prior to transfer.  Pharmacy consulted to continue IV heparin while here and hold Coumadin for now.  1/18 AM update: heparin level low this AM, no issues per RN.   Goal of Therapy:  Heparin level 0.3-0.7 units/ml Monitor platelets by anticoagulation protocol: Yes   Plan:   Inc heparin to 1750 units/hr Re-check heparin level in 8 hours Daily heparin level and CBC  Narda Bonds, PharmD, BCPS Clinical Pharmacist Phone: [phone]

## 2018-11-04 NOTE — Progress Notes (Signed)
NAME:  [name] [name], MRN:  [id number], DOB:  Mar 20, 1941, LOS: 2 ADMISSION DATE:  11/02/2018, CONSULTATION DATE:  11/02/2018 REFERRING MD:  Benson Norway, CHIEF COMPLAINT:  Respiratory failure  HPI/course in hospital  78 year old man who underwent ORIF of L femoral neck fracture 1/14.  Required reintubation in PACU, so transferred for further management.  Concern for possible aspiration.  Past Medical History  He,  has a past medical history of Arthritis, Bronchiectasis, Chronic bronchitis ([place]), COPD (chronic obstructive pulmonary disease) ([place]), Essential hypertension, benign, GERD (gastroesophageal reflux disease), History of gout, Hypercholesteremia, Impaired vision, Left atrial enlargement, Memory difficulties, Nephrolithiasis, Obesity, OSA (obstructive sleep apnea), Persistent atrial fibrillation, Recurrent upper respiratory infection (URI), and Type 2 diabetes mellitus ([place]).  Past Surgical History:  Procedure Laterality Date  . CARDIAC CATHETERIZATION  6/09   Normal coronaries  . CARDIOVERSION  08/2012   successfully restored to NSR  . CARDIOVERSION  08/26/2012   Procedure: CARDIOVERSION;  Surgeon: Thompson Grayer, MD;  Location: [facility] OR;  Service: Cardiovascular;  Laterality: N/A;  . INCISE AND DRAIN ABCESS  2006   Posterior neck  . [place] SURGERY  2010  . LUNG LOBECTOMY  1959   Left  . REPLACEMENT DISC ANTERIOR LUMBAR SPINE  2011   "put rods in" (08/23/2012)  . TONSILLECTOMY  ~ 1946  . TOTAL HIP ARTHROPLASTY  08/23/2011   Procedure: TOTAL HIP ARTHROPLASTY;  Surgeon: Rudean [place], MD;  Location: [place];  Service: Orthopedics;  Laterality: Right;  . URETEROLITHOTOMY     stone removed from ureter    Interim history/subjective:  No overnight events.  More awake today.  Objective   Blood pressure 130/71, pulse 86, temperature 99.9 F (37.7 C), temperature source Axillary, resp. rate (!) 28, height 6\' 3"  (1.905 m), weight 127.6 kg, SpO2 99 %.    Vent Mode: CPAP;PSV FiO2  (%):  [40 %] 40 % Set Rate:  [18 bmp] 18 bmp Vt Set:  [670 mL] 670 mL PEEP:  [5 cmH20] 5 cmH20 Pressure Support:  [5 cmH20] 5 cmH20 Plateau Pressure:  [21 cmH20-24 cmH20] 21 cmH20   Intake/Output Summary (Last 24 hours) at 11/04/2018 0932 Last data filed at 11/04/2018 0900 Gross per 24 hour  Intake 4570.49 ml  Output 2175 ml  Net 2395.49 ml   Filed Weights   11/02/18 2300 11/03/18 0454 11/04/18 0411  Weight: 125 kg 127.1 kg 127.6 kg    Examination: Physical Exam  Constitutional: He appears well-developed and well-nourished. He appears lethargic. He is cooperative. He appears ill. No distress. He is intubated.  HENT:  ETT and OGT in place  Cardiovascular: Normal rate and normal heart sounds. An irregular rhythm present.  Extremities warm  Respiratory: Effort normal. No tachypnea. He is intubated. He has decreased breath sounds in the left middle field and the left lower field.  Tolerated 2h SBT.  GI: Soft. There is no abdominal tenderness.  Musculoskeletal:     Comments: L hip incision.  Neurological: He appears lethargic.  Follows commands weakly  Skin:  Edematous     Ancillary tests (personally reviewed)  CBC: Recent Labs  Lab 11/02/18 2034 11/03/18 0540  WBC 21.6* 17.2*  HGB 8.4* 10.9*  HCT 26.4* 35.6*  MCV 91.3 88.6  PLT 81* 105*    Basic Metabolic Panel: Recent Labs  Lab 11/02/18 2148 11/03/18 0540 11/04/18 0401  NA 138 137 140  K 5.1 4.5 3.9  CL 106 108 110  CO2 18* 17* 19*  GLUCOSE  270* 241* 263*  BUN 42* 48* 50*  CREATININE 2.51* 2.36* 1.44*  CALCIUM 7.9* 7.6* 7.8*  MG 1.8 1.7  --   PHOS 4.8* 4.6  --    GFR: Estimated Creatinine Clearance: 62.8 mL/min (A) (by C-G formula based on SCr of 1.44 mg/dL (H)). Recent Labs  Lab 11/02/18 2034 11/03/18 0540  WBC 21.6* 17.2*    Liver Function Tests: Recent Labs  Lab 11/02/18 2148 11/04/18 0401  AST 143* 94*  ALT 85* 127*  ALKPHOS 55 59  BILITOT 1.4* 0.7  PROT 5.4* 5.1*  ALBUMIN 2.4* 1.9*    No results for input(s): LIPASE, AMYLASE in the last 168 hours. No results for input(s): AMMONIA in the last 168 hours.  ABG    Component Value Date/Time   PHART 7.331 (L) 11/03/2018 0432   PCO2ART 33.9 11/03/2018 0432   PO2ART 79.9 (L) 11/03/2018 0432   HCO3 17.5 (L) 11/03/2018 0432   TCO2 22 11/02/2018 2008   ACIDBASEDEF 7.4 (H) 11/03/2018 0432   O2SAT 94.8 11/03/2018 0432     Coagulation Profile: Recent Labs  Lab 11/02/18 2034  INR 1.52   CBG: Recent Labs  Lab 11/03/18 1521 11/03/18 1935 11/03/18 2316 11/04/18 0350 11/04/18 0726  GLUCAP 174* 203* 230* 225* 235*   CXR: personally reviewed - persistently opacified LL lung field.  POC U/S left chest: + lung sliding. Consolidated lung L base with no effusion.  Assessment & Plan:   Critically ill due to acute respiratory failure requiring mechanical ventilation.  Acceptable lung mechanics but mental status, weakness and secretions preclude extubation. Volume overload Recent left hip fracture. Known COPD with bronchiectasis and OSA AF now rate controlled.  PLAN:  Continue full ventilatory support  Daily SBT Continue to minimize sedation Continue aggressive pulmonary toilette Diuresis, Stop maintenance fluids. Resume home diltiazem and Xarelto  Best practice:  Diet:  Tube feeds at goal Pain/Anxiety/Delirium protocol (if indicated): intermittent pain medications only. VAP protocol (if indicated):  Bundle in place DVT prophylaxis: iv heparin GI prophylaxis: pantoprozole Glucose control: SSI + basal Mobility:  Bedrest  Code Status: full Family Communication: wife updated at bedside. Disposition: ICU   Critical care time: 40 min including review of clinical data, examination of patient, review and adjustment of ventilation.    Kipp Brood, MD [facility] ICU Physician [place]  Pager: [phone] Mobile: [phone] After hours: [phone].  11/04/2018, 9:32 AM

## 2018-11-05 LAB — BASIC METABOLIC PANEL
Anion gap: 11 (ref 5–15)
BUN: 43 mg/dL — ABNORMAL HIGH (ref 8–23)
CO2: 23 mmol/L (ref 22–32)
Calcium: 8.2 mg/dL — ABNORMAL LOW (ref 8.9–10.3)
Chloride: 107 mmol/L (ref 98–111)
Creatinine, Ser: 1.12 mg/dL (ref 0.61–1.24)
GFR calc Af Amer: >60 mL/min (ref >60)
GFR calc non Af Amer: >60 mL/min (ref >60)
Glucose, Bld: 263 mg/dL — ABNORMAL HIGH (ref 70–99)
POTASSIUM: 3.5 mmol/L (ref 3.5–5.1)
Sodium: 141 mmol/L (ref 135–145)

## 2018-11-05 LAB — GLUCOSE, CAPILLARY
GLUCOSE-CAPILLARY: 219 mg/dL — AB (ref 70–99)
Glucose-Capillary: 216 mg/dL — ABNORMAL HIGH (ref 70–99)
Glucose-Capillary: 254 mg/dL — ABNORMAL HIGH (ref 70–99)
Glucose-Capillary: 211 mg/dL — ABNORMAL HIGH (ref 70–99)
Glucose-Capillary: 180 mg/dL — ABNORMAL HIGH (ref 70–99)

## 2018-11-05 LAB — HEPARIN LEVEL (UNFRACTIONATED)
Heparin Unfractionated: 0.33 IU/mL (ref 0.30–0.70)
Heparin Unfractionated: 0.36 IU/mL (ref 0.30–0.70)

## 2018-11-05 LAB — CULTURE, RESPIRATORY W GRAM STAIN: Culture: NORMAL

## 2018-11-05 LAB — CBC
HCT: 32.7 % — ABNORMAL LOW (ref 39.0–52.0)
Hemoglobin: 10.3 g/dL — ABNORMAL LOW (ref 13.0–17.0)
MCH: 27.2 pg (ref 26.0–34.0)
MCHC: 31.5 g/dL (ref 30.0–36.0)
MCV: 86.5 fL (ref 80.0–100.0)
Platelets: 122 K/uL — ABNORMAL LOW (ref 150–400)
RBC: 3.78 MIL/uL — ABNORMAL LOW (ref 4.22–5.81)
RDW: 17.5 % — ABNORMAL HIGH (ref 11.5–15.5)
WBC: 17.7 K/uL — ABNORMAL HIGH (ref 4.0–10.5)
nRBC: 1 % — ABNORMAL HIGH (ref 0.0–0.2)

## 2018-11-05 LAB — CULTURE, RESPIRATORY

## 2018-11-05 MED ORDER — WARFARIN - PHARMACIST DOSING INPATIENT
Freq: Every day | Status: DC
  Administered 2018-11-08 – 2018-11-09 (×2)

## 2018-11-05 MED ORDER — INSULIN GLARGINE 100 UNIT/ML ~~LOC~~ SOLN
7.0000 [IU] | Freq: Every day | SUBCUTANEOUS | Status: DC
  Administered 2018-11-06 – 2018-11-10 (×5): 7 [IU] via SUBCUTANEOUS
  Filled 2018-11-05 (×5): qty 0.07

## 2018-11-05 MED ORDER — WARFARIN SODIUM 5 MG PO TABS: 5.0000 mg | ORAL_TABLET | Freq: Every day | ORAL | Status: DC

## 2018-11-05 MED ORDER — WARFARIN SODIUM 7.5 MG PO TABS
7.5000 mg | ORAL_TABLET | ORAL | Status: DC
  Filled 2018-11-05: qty 1

## 2018-11-05 NOTE — Progress Notes (Signed)
[place] for Heparin Indication: atrial fibrillation  Allergies  Allergen Reactions  . Contrast Media [Iodinated Diagnostic Agents] Rash  . Penicillins Rash    Did it involve swelling of the face/tongue/throat, SOB, or low BP? Unknown Did it involve sudden or severe rash/hives, skin peeling, or any reaction on the inside of your mouth or nose? Unknown Did you need to seek medical attention at a hospital or doctor's office? Unknown When did it last happen? If all above answers are "NO", may proceed with cephalosporin use.   . Quinidine Rash  . Quinine Derivatives Rash    quinidine  . Sulfa Antibiotics Rash   Patient Measurements: Height: '6\' 3"'  (190.5 cm) Weight: 281 lb 1.4 oz (127.5 kg) IBW/kg (Calculated) : 84.5 Heparin Dosing Weight: 112 kg  Vital Signs: Temp: 99.5 F (37.5 C) (01/19 0730) Temp Source: Axillary (01/19 0730) BP: 119/75 (01/19 0844) Pulse Rate: 77 (01/19 0844)  Labs: Recent Labs    11/02/18 2034  11/03/18 0540 11/04/18 0401 11/04/18 1425 11/04/18 1618 11/05/18 0107 11/05/18 0926  HGB 8.4*  --  10.9*  --   --   --  10.3*  --   HCT 26.4*  --  35.6*  --   --   --  32.7*  --   PLT 81*  --  105*  --   --   --  122*  --   LABPROT 18.1*  --   --   --   --   --   --   --   INR 1.52  --   --   --   --   --   --   --   HEPARINUNFRC  --    < > 0.53 0.14* 0.12*  --  0.33 0.36  CREATININE  --    < > 2.36* 1.44*  --  1.22 1.12  --    < > = values in this interval not displayed.    Estimated Creatinine Clearance: 80.7 mL/min (by C-G formula based on SCr of 1.12 mg/dL).  Assessment: 22 yoM presented to [facility] s/p fall and underwent fixation for hip fracture on 10/31/18, required re-intubation in the PACU and was transferred to [facility] for further management. PMH s/f afib on warfarin PTA. Patient was started on IV heparin at 1560 units/hr at OSH prior to transfer. Pharmacy consulted to continue dosing IV heparin.  Heparin level this AM is within goal range at 0.36. Hgb stable 10.3, pltc up 122. No bleeding noted.  Goal of Therapy:  Heparin level 0.3-0.7 units/ml Monitor platelets by anticoagulation protocol: Yes   Plan:   Continue heparin gtt at 2100 units/hrr Daily heparin level and CBC Monitor for s/sx of bleeding  Erin N. Gerarda Fraction, PharmD, [place] PGY2 Infectious Diseases Pharmacy Resident Phone: [phone]

## 2018-11-05 NOTE — Progress Notes (Signed)
[place] for Heparin Indication: atrial fibrillation  Allergies  Allergen Reactions  . Contrast Media [Iodinated Diagnostic Agents] Rash  . Penicillins Rash    Did it involve swelling of the face/tongue/throat, SOB, or low BP? Unknown Did it involve sudden or severe rash/hives, skin peeling, or any reaction on the inside of your mouth or nose? Unknown Did you need to seek medical attention at a hospital or doctor's office? Unknown When did it last happen? If all above answers are "NO", may proceed with cephalosporin use.   . Quinidine Rash  . Quinine Derivatives Rash    quinidine  . Sulfa Antibiotics Rash   Patient Measurements: Height: 6\' 3"  (190.5 cm) Weight: 281 lb 1.4 oz (127.5 kg) IBW/kg (Calculated) : 84.5 Heparin Dosing Weight: 112 kg  Vital Signs: Temp: 99.5 F (37.5 C) (01/19 1540) Temp Source: Axillary (01/19 1540) BP: 141/77 (01/19 1800) Pulse Rate: 81 (01/19 1800)  Labs: Recent Labs    11/02/18 2034  11/03/18 0540 11/04/18 0401 11/04/18 1425 11/04/18 1618 11/05/18 0107 11/05/18 0926  HGB 8.4*  --  10.9*  --   --   --  10.3*  --   HCT 26.4*  --  35.6*  --   --   --  32.7*  --   PLT 81*  --  105*  --   --   --  122*  --   LABPROT 18.1*  --   --   --   --   --   --   --   INR 1.52  --   --   --   --   --   --   --   HEPARINUNFRC  --    < > 0.53 0.14* 0.12*  --  0.33 0.36  CREATININE  --    < > 2.36* 1.44*  --  1.22 1.12  --    < > = values in this interval not displayed.    Estimated Creatinine Clearance: 80.7 mL/min (by C-G formula based on SCr of 1.12 mg/dL).  Assessment: 29 yoM presented to [facility] s/p fall and underwent fixation for hip fracture on 10/31/18, required re-intubation in the PACU and was transferred to [facility] for further management. PMH s/f afib on warfarin PTA. Patient was started on IV heparin at 1560 units/hr at OSH prior to transfer. Pharmacy consulted to continue dosing IV heparin.  Heparin level this AM is within goal range at 0.36. Hgb stable 10.3, pltc up 122. No bleeding noted.  Pt now extubated. Per CCM okay to resume warfarin. PTA Dose = 7.5mg  Mon/Fri, 5mg  AODs. Last dose was 1/11 and last INR on 1/16 was 1.52.   Goal of Therapy:  Heparin level 0.3-0.7 units/ml Monitor platelets by anticoagulation protocol: Yes   Plan:   Continue heparin at 2100 units/hr as previously noted Warfarin 7.5mg  PO x1 tonight INR tomorrow  Arrie Senate, PharmD, BCPS Clinical Pharmacist [phone] Please check AMION for all [place] numbers 11/05/2018

## 2018-11-05 NOTE — Progress Notes (Addendum)
NAME:  [name] [name], MRN:  [id number], DOB:  [birth year], LOS: 3 ADMISSION DATE:  11/02/2018, CONSULTATION DATE:  11/02/18 REFERRING MD:  Benson Norway  CHIEF COMPLAINT:  Left Hip Fx   Brief History   This is a 78 year old male with a history of Afib on coumain, COPD s/p LLL pneumonectomy, OSA, DM, HTN, HLD, GERD, gout, and memory difficulties with recent surgical fixation of the left femoral neck at [facility] who was extubated in the OR but required re-intubation for hypoxia and increased WOB. Transferred to [facility] for further management.   History of present illness   Pt is encephelopathic; therefore, this HPI is obtained from chart review. [name] [name] is a 78 y.o. male who has a PMH as outlined below (see "past medical history").  He presented to [facility] ED 1/12 after a fall in his driveway.  He complained of left hip pain and was found to have a left femoral neck fracture.  He was held in ED there while awaiting at bed at [facility].  There was no bed availability at [facility]; therefore, he was admitted to [facility] and underwent surgical fixation 1/14.  Per his wife, he was extubated in the OR; however, required reintubation shortly thereafter due to hypoxia and increased WOB.  There was talk of possible extubation 1/16; however, wife states they were told that he might have aspirated or be developing PNA; therefore, he remained intubated and was transferred to [facility] later that evening.  Past Medical History   Past Medical History:  Diagnosis Date  . Arthritis   . Bronchiectasis    L lung lobe removed  . Chronic bronchitis ([place])   . COPD (chronic obstructive pulmonary disease) ([place])   . Essential hypertension, benign   . GERD (gastroesophageal reflux disease)   . History of gout   . Hypercholesteremia   . Impaired vision    Wears contacts or glasses  . Left atrial enlargement    LA size 4mm by echo  . Memory difficulties    Forgetfullness, taking Aricept preventatively, father had Alzheimers    . Nephrolithiasis   . Obesity   . OSA (obstructive sleep apnea)   . Persistent atrial fibrillation   . Recurrent upper respiratory infection (URI)   . Type 2 diabetes mellitus ([place])      Significant Hospital Events   1/12 > presented to [facility] 1/14 > left hip surgery 1/16 > transferred to [facility].  Consults:  Ortho  Procedures:  1/14 > left hip surgery  Significant Diagnostic Tests:  CT head 1/12 > negative  CXR  1/16 > Opacification of the left hemithorax  Micro Data:  Blood cultures 1/16 >  NGTD Sputum cultures 1/16 > Reincubated for better growth, abundant gram positive cocci and gram negative rods  Antimicrobials:  Zosyn 1/16 >  11/05/18 Rocephin 1/16 >  Flagyl 1/16 > 1/17  Interim history/subjective:  Patient was intubated, he had received 50 mg of fentanyl this morning but has not had any sedation since then. He is intermittently following commands. No acute events overnight. No family at bedside.   Objective   Blood pressure 117/69, pulse 83, temperature 98.2 F (36.8 C), temperature source Oral, resp. rate 18, height 6\' 3"  (1.905 m), weight 127.5 kg, SpO2 98 %.    Vent Mode: PRVC FiO2 (%):  [40 %] 40 % Set Rate:  [18 bmp] 18 bmp Vt Set:  [670 mL] 670 mL PEEP:  [5 cmH20] 5 cmH20 Pressure Support:  [5  cmH20] 5 cmH20 Plateau Pressure:  [20 cmH20-23 cmH20] 21 cmH20   Intake/Output Summary (Last 24 hours) at 11/05/2018 0609 Last data filed at 11/05/2018 0500 Gross per 24 hour  Intake 2420.3 ml  Output 5650 ml  Net -3229.7 ml   Filed Weights   11/03/18 0454 11/04/18 0411 11/05/18 0500  Weight: 127.1 kg 127.6 kg 127.5 kg    Examination: General: Intubated, sedated, intermittently following commands HENT: Normocephalic, atraumatic Lungs: Bilateral rhonchi, decreased breath sounds over left lung base.  Cardiovascular: Irregular rhythm, regular rate, no m/r/g Abdomen: soft, no guarding, constant tremor Extremities: Left hip in bandage, right lower extremity  warm and dry, trace edema bilaterally Neuro: Sedated, right upper extremity tremor with movement GU: Foley in place  Resolved Hospital Problem list     Assessment & Plan:   Acute hypoxic and hypercapnic respiratory failure COPD, s/p LLL pneumonectomy, OSA Possible aspiration: Bronchiectasis: Patient appears to have some improvement in his mental status. Now intermittently following commands and nurse reports that he was more awake yesterday. He had been on SBT yesterday and had been doing well mechanically. EEG has been negative, CT head showed generalized atrophy, no acute findings. PRVC, FiO2 40, PEEP 5, RR 18, TV 670. He still have a leukocytosis however has remained afebrile. Patient is more awake later on during rounds. Will attempt to extubate today.  -Weaned off propofol -Extubate today -Continue duonebs -Discontinue zosyn -Complete 7 days of rocephin -F/u sputum cultures -Daily CBC -Chest PT and Suctioning  Left hip surgery: -Performed at [facility], no records present, will need to obtain records to see what was done -Dr. Case is the orthopedic who performed the surgery -Ortho following   Acute vs chronic kidney injury: -Cr today was normal, improvement from his admission Cr which was 2.51. This was likely 2/2 pre-renal azotemia.   Thrombocytopenia: -Platelets today was 122, 89 on admission. It appears that it was around this level in 2013. Will continue to monitor. No signs of active bleeding, Hgb has been stable. This appears to be stable.  -Daily CBC  Afib: -Currently  rate controlled -Started on home diltiazem  -Restart warfarin if extubation is successful  H/o hypomagnesemia -Recheck mag in AM -Replete as needed  BPH: -Patient is on proscar and vesicare at home.  -Currently has indwelling catheter  Diabetes mellitus: -On SSI -CBGs have been elevated -Increase Lantus to 7 units daily -Frequent CBGs  Memory issues/ dementia -Continue aricept via  OGT  Best practice:  Diet: NPO. Pain/Anxiety/Delirium protocol (if indicated): Fentanyl PRN.  RASS goal 0 to -1. VAP protocol (if indicated): In place. DVT prophylaxis: SCD's / Heparin gtt/ GI prophylaxis: PPI. Glucose control: SSI. Mobility: Bedrest. Code Status: Full. Family Communication: Discussed with wife Disposition: ICU   Labs   CBC: Recent Labs  Lab 11/02/18 2034 11/03/18 0540 11/05/18 0107  WBC 21.6* 17.2* 17.7*  HGB 8.4* 10.9* 10.3*  HCT 26.4* 35.6* 32.7*  MCV 91.3 88.6 86.5  PLT 81* 105* 122*    Basic Metabolic Panel: Recent Labs  Lab 11/02/18 2148 11/03/18 0540 11/04/18 0401 11/04/18 1618 11/05/18 0107  NA 138 137 140 141 141  K 5.1 4.5 3.9 4.1 3.5  CL 106 108 110 108 107  CO2 18* 17* 19* 21* 23  GLUCOSE 270* 241* 263* 291* 263*  BUN 42* 48* 50* 45* 43*  CREATININE 2.51* 2.36* 1.44* 1.22 1.12  CALCIUM 7.9* 7.6* 7.8* 8.2* 8.2*  MG 1.8 1.7  --   --   --  PHOS 4.8* 4.6  --   --   --    GFR: Estimated Creatinine Clearance: 80.7 mL/min (by C-G formula based on SCr of 1.12 mg/dL). Recent Labs  Lab 11/02/18 2034 11/03/18 0540 11/05/18 0107  WBC 21.6* 17.2* 17.7*    Liver Function Tests: Recent Labs  Lab 11/02/18 2148 11/04/18 0401  AST 143* 94*  ALT 85* 127*  ALKPHOS 55 59  BILITOT 1.4* 0.7  PROT 5.4* 5.1*  ALBUMIN 2.4* 1.9*   No results for input(s): LIPASE, AMYLASE in the last 168 hours. No results for input(s): AMMONIA in the last 168 hours.  ABG    Component Value Date/Time   PHART 7.331 (L) 11/03/2018 0432   PCO2ART 33.9 11/03/2018 0432   PO2ART 79.9 (L) 11/03/2018 0432   HCO3 17.5 (L) 11/03/2018 0432   TCO2 22 11/02/2018 2008   ACIDBASEDEF 7.4 (H) 11/03/2018 0432   O2SAT 94.8 11/03/2018 0432     Coagulation Profile: Recent Labs  Lab 11/02/18 2034  INR 1.52    Cardiac Enzymes: No results for input(s): CKTOTAL, CKMB, CKMBINDEX, TROPONINI in the last 168 hours.  HbA1C: No results found for: HGBA1C  CBG: Recent  Labs  Lab 11/04/18 1108 11/04/18 1516 11/04/18 1935 11/04/18 2351 11/05/18 0406  GLUCAP 251* 244* 254* 255* 216*    Review of Systems:   Patient is currently sedated and intubated, unable to obtain.  Past Medical History  He,  has a past medical history of Arthritis, Bronchiectasis, Chronic bronchitis ([place]), COPD (chronic obstructive pulmonary disease) ([place]), Essential hypertension, benign, GERD (gastroesophageal reflux disease), History of gout, Hypercholesteremia, Impaired vision, Left atrial enlargement, Memory difficulties, Nephrolithiasis, Obesity, OSA (obstructive sleep apnea), Persistent atrial fibrillation, Recurrent upper respiratory infection (URI), and Type 2 diabetes mellitus ([place]).   Surgical History    Past Surgical History:  Procedure Laterality Date  . CARDIAC CATHETERIZATION  6/09   Normal coronaries  . CARDIOVERSION  08/2012   successfully restored to NSR  . CARDIOVERSION  08/26/2012   Procedure: CARDIOVERSION;  Surgeon: Thompson Grayer, MD;  Location: [facility];  Service: Cardiovascular;  Laterality: N/A;  . INCISE AND DRAIN ABCESS  2006   Posterior neck  . [place] SURGERY  2010  . LUNG LOBECTOMY  1959   Left  . REPLACEMENT DISC ANTERIOR LUMBAR SPINE  2011   "put rods in" (08/23/2012)  . TONSILLECTOMY  ~ 1946  . TOTAL HIP ARTHROPLASTY  08/23/2011   Procedure: TOTAL HIP ARTHROPLASTY;  Surgeon: Rudean Haskell, MD;  Location: [place];  Service: Orthopedics;  Laterality: Right;  . URETEROLITHOTOMY     stone removed from ureter     Social History   reports that he has never smoked. He has never used smokeless tobacco. He reports current alcohol use. He reports that he does not use drugs.   Family History   His family history includes Dementia in his father.   Allergies Allergies  Allergen Reactions  . Contrast Media [Iodinated Diagnostic Agents] Rash  . Penicillins Rash    Did it involve swelling of the face/tongue/throat, SOB, or low BP? Unknown Did it  involve sudden or severe rash/hives, skin peeling, or any reaction on the inside of your mouth or nose? Unknown Did you need to seek medical attention at a hospital or doctor's office? Unknown When did it last happen? If all above answers are "NO", may proceed with cephalosporin use.   . Quinidine Rash  . Quinine Derivatives Rash    quinidine  . Sulfa  Antibiotics Rash     Home Medications  Prior to Admission medications   Medication Sig Start Date End Date Taking? Authorizing Provider  albuterol (PROVENTIL HFA;VENTOLIN HFA) 108 (90 BASE) MCG/ACT inhaler Inhale 2 puffs into the lungs every 6 (six) hours as needed. For shortness of breath     [provider]  diltiazem (CARDIZEM CD) 180 MG 24 hr capsule Take 1 capsule (180 mg total) by mouth daily. 09/04/12   Allred, Jeneen Rinks, MD  donepezil (ARICEPT) 10 MG tablet Take 10 mg by mouth at bedtime.      [provider]  finasteride (PROSCAR) 5 MG tablet Take 5 mg by mouth at bedtime.     [provider]  furosemide (LASIX) 20 MG tablet Take 20 mg by mouth daily.  11/27/12   [provider]  glipiZIDE (GLUCOTROL XL) 5 MG 24 hr tablet Take 5 mg by mouth daily.      [provider]  HYDROcodone-acetaminophen (NORCO) 10-325 MG per tablet Take 1 tablet by mouth every 6 (six) hours as needed. For pain.    [provider]  lansoprazole (PREVACID) 15 MG capsule Take 15 mg by mouth 2 (two) times daily.      [provider]  lisinopril (PRINIVIL,ZESTRIL) 10 MG tablet Take 10 mg by mouth at bedtime.     [provider]  loratadine (CLARITIN) 10 MG tablet Take 10 mg by mouth daily.    [provider]  metFORMIN (GLUCOPHAGE) 500 MG tablet Take 250 mg by mouth 2 (two) times daily with a meal.     [provider]  Rivaroxaban (XARELTO) 20 MG TABS Take 1 tablet (20 mg total) by mouth daily at 8 pm. 11/15/12   Allred, Jeneen Rinks, MD  simvastatin (ZOCOR) 40 MG tablet Take 40 mg  by mouth at bedtime.      [provider]  solifenacin (VESICARE) 10 MG tablet Take 10 mg by mouth at bedtime.    [provider]        Asencion Noble, M.D. PGY1 Pager [phone] 11/05/2018 6:09 AM

## 2018-11-05 NOTE — Progress Notes (Signed)
Notified Elink of pt's HR of 46. HR now 63. Patient was asleep and has no complaints. Will continue to monitor.

## 2018-11-05 NOTE — Procedures (Signed)
Extubation Procedure Note  Patient Details:   Name: [name] [name] DOB: 1941-08-10 MRN: [id number]   Airway Documentation:    Vent end date: 11/05/2018 Vent end time: 1205   Evaluation  O2 sats: stable throughout Complications: No apparent complications Patient did tolerate procedure well. Bilateral Breath Sounds: Clear   Rt Note: Patient was extubated per MD order and was placed on 4lpm. Patient had been weaning all morning with no complications. Patient's vitals are: Hr-82, RR-20, Spo2-96%. Breath sounds are clear and no stridor is present nor any signs of respiratory distress. Patient had a positive leak test as well. Rt will continue to monitor and assist as needed.   Melina Schools 11/05/2018, 12:05 PM

## 2018-11-05 NOTE — Progress Notes (Signed)
ANTICOAGULATION CONSULT NOTE  Pharmacy Consult:  Heparin Indication: atrial fibrillation  Allergies  Allergen Reactions  . Contrast Media [Iodinated Diagnostic Agents] Rash  . Penicillins Rash    Did it involve swelling of the face/tongue/throat, SOB, or low BP? Unknown Did it involve sudden or severe rash/hives, skin peeling, or any reaction on the inside of your mouth or nose? Unknown Did you need to seek medical attention at a hospital or doctor's office? Unknown When did it last happen? If all above answers are "NO", may proceed with cephalosporin use.   . Quinidine Rash  . Quinine Derivatives Rash    quinidine  . Sulfa Antibiotics Rash    Patient Measurements: Height: 6\' 3"  (190.5 cm) Weight: 281 lb 4.9 oz (127.6 kg) IBW/kg (Calculated) : 84.5 Heparin Dosing Weight: 112 kg  Vital Signs: Temp: 99.2 F (37.3 C) (01/18 2348) Temp Source: Oral (01/18 2348) BP: 144/82 (01/19 0100) Pulse Rate: 80 (01/19 0100)  Labs: Recent Labs    11/02/18 2034  11/03/18 0540 11/04/18 0401 11/04/18 1425 11/04/18 1618 11/05/18 0107  HGB 8.4*  --  10.9*  --   --   --  10.3*  HCT 26.4*  --  35.6*  --   --   --  32.7*  PLT 81*  --  105*  --   --   --  122*  LABPROT 18.1*  --   --   --   --   --   --   INR 1.52  --   --   --   --   --   --   HEPARINUNFRC  --    < > 0.53 0.14* 0.12*  --  0.33  CREATININE  --    < > 2.36* 1.44*  --  1.22 1.12   < > = values in this interval not displayed.    Estimated Creatinine Clearance: 80.7 mL/min (by C-G formula based on SCr of 1.12 mg/dL).   Medical History: Past Medical History:  Diagnosis Date  . Arthritis   . Bronchiectasis    L lung lobe removed  . Chronic bronchitis ([place])   . COPD (chronic obstructive pulmonary disease) ([place])   . Essential hypertension, benign   . GERD (gastroesophageal reflux disease)   . History of gout   . Hypercholesteremia   . Impaired vision    Wears contacts or glasses  . Left atrial enlargement     LA size 11mm by echo  . Memory difficulties    Forgetfullness, taking Aricept preventatively, father had Alzheimers  . Nephrolithiasis   . Obesity   . OSA (obstructive sleep apnea)   . Persistent atrial fibrillation   . Recurrent upper respiratory infection (URI)   . Type 2 diabetes mellitus ([place])     Assessment: 45 YOM presented to [facility] s/p fall and underwent fixation for hip fracture on 10/31/18.  Patient was on Coumadin for Afib and also started on IV heparin at 1560 units/hr at 1250 prior to transfer.  Pharmacy consulted to continue IV heparin while here and hold Coumadin for now.  1/19 AM update: heparin level therapeutic x 1 this AM after rate increase  Goal of Therapy:  Heparin level 0.3-0.7 units/ml Monitor platelets by anticoagulation protocol: Yes   Plan:   Cont heparin 2100 units/hr Re-check heparin level in 8 hours  Narda Bonds, PharmD, [place] Pharmacist Phone: [phone]

## 2018-11-06 ENCOUNTER — Inpatient Hospital Stay (HOSPITAL_COMMUNITY): Payer: Medicare Other

## 2018-11-06 LAB — BASIC METABOLIC PANEL
Anion gap: 12 (ref 5–15)
BUN: 33 mg/dL — ABNORMAL HIGH (ref 8–23)
CO2: 27 mmol/L (ref 22–32)
Calcium: 8.6 mg/dL — ABNORMAL LOW (ref 8.9–10.3)
Chloride: 106 mmol/L (ref 98–111)
Creatinine, Ser: 0.96 mg/dL (ref 0.61–1.24)
GFR calc Af Amer: >60 mL/min (ref >60)
GFR calc non Af Amer: >60 mL/min (ref >60)
Glucose, Bld: 150 mg/dL — ABNORMAL HIGH (ref 70–99)
Potassium: 3.6 mmol/L (ref 3.5–5.1)
Sodium: 145 mmol/L (ref 135–145)

## 2018-11-06 LAB — CBC
HCT: 34.4 % — ABNORMAL LOW (ref 39.0–52.0)
Hemoglobin: 10.7 g/dL — ABNORMAL LOW (ref 13.0–17.0)
MCH: 27.1 pg (ref 26.0–34.0)
MCHC: 31.1 g/dL (ref 30.0–36.0)
MCV: 87.1 fL (ref 80.0–100.0)
Platelets: 160 10*3/uL (ref 150–400)
RBC: 3.95 MIL/uL — ABNORMAL LOW (ref 4.22–5.81)
RDW: 17.1 % — ABNORMAL HIGH (ref 11.5–15.5)
WBC: 13.9 10*3/uL — AB (ref 4.0–10.5)
nRBC: 1.2 % — ABNORMAL HIGH (ref 0.0–0.2)

## 2018-11-06 LAB — GLUCOSE, CAPILLARY
Glucose-Capillary: 132 mg/dL — ABNORMAL HIGH (ref 70–99)
Glucose-Capillary: 140 mg/dL — ABNORMAL HIGH (ref 70–99)
Glucose-Capillary: 140 mg/dL — ABNORMAL HIGH (ref 70–99)
Glucose-Capillary: 140 mg/dL — ABNORMAL HIGH (ref 70–99)
Glucose-Capillary: 148 mg/dL — ABNORMAL HIGH (ref 70–99)
Glucose-Capillary: 148 mg/dL — ABNORMAL HIGH (ref 70–99)
Glucose-Capillary: 164 mg/dL — ABNORMAL HIGH (ref 70–99)

## 2018-11-06 LAB — MAGNESIUM: Magnesium: 1.7 mg/dL (ref 1.7–2.4)

## 2018-11-06 LAB — PROTIME-INR
INR: 1.3
PROTHROMBIN TIME: 16 s — AB (ref 11.4–15.2)

## 2018-11-06 LAB — HEPARIN LEVEL (UNFRACTIONATED): Heparin Unfractionated: 0.31 IU/mL (ref 0.30–0.70)

## 2018-11-06 MED ORDER — ORAL CARE MOUTH RINSE
15.0000 mL | Freq: Two times a day (BID) | OROMUCOSAL | Status: DC
  Administered 2018-11-07 – 2018-11-10 (×5): 15 mL via OROMUCOSAL

## 2018-11-06 MED ORDER — WARFARIN SODIUM 7.5 MG PO TABS
7.5000 mg | ORAL_TABLET | Freq: Once | ORAL | Status: DC
  Filled 2018-11-06: qty 1

## 2018-11-06 MED ORDER — CHLORHEXIDINE GLUCONATE 0.12 % MT SOLN
15.0000 mL | Freq: Two times a day (BID) | OROMUCOSAL | Status: DC
  Administered 2018-11-07 – 2018-11-10 (×6): 15 mL via OROMUCOSAL
  Filled 2018-11-06 (×5): qty 15

## 2018-11-06 MED ORDER — ACETAMINOPHEN 325 MG PO TABS
650.0000 mg | ORAL_TABLET | Freq: Four times a day (QID) | ORAL | Status: DC | PRN
  Administered 2018-11-09: 650 mg via ORAL
  Filled 2018-11-06: qty 2

## 2018-11-06 MED ORDER — MAGNESIUM SULFATE 2 GM/50ML IV SOLN
2.0000 g | Freq: Once | INTRAVENOUS | Status: AC
  Administered 2018-11-06: 2 g via INTRAVENOUS
  Filled 2018-11-06: qty 50

## 2018-11-06 NOTE — Progress Notes (Addendum)
Modified Barium Swallow Progress Note  Patient Details  Name: [name] [name] MRN: [id number] Date of Birth: 1941/05/02  Today's Date: 11/06/2018  Modified Barium Swallow completed.  Full report located under Chart Review in the Imaging Section.  Brief recommendations include the following:  Clinical Impression  Pt presents with mild oropharyngeal dysphagia c/b delayed swallow initiation, decreased base of tongue retraction, reduced epiglottic inversion, incomplete laryngeal closure, reduced hyolaryngeal excursion, and diminished sensation.  These deficits resulted in mild penetration of thin liquid before the swallow, with suspected unvisualized aspiration after the swallow, and trace static penetration of nectar and honey thick liquids.  Cued cough did not clear penetration. Alternating liquids with heavier puree/solid boluses did not clear penetration.  There was severe vallecula stasis of puree and solid textures with very little passage of bolus into esophagus.  Residuals were very difficult to clear even with mulitple swallows and liquid wash. Pt required verbal cuing and assistance with maintaining head in neutral upright position for this evaluation.  If pt continues to follow directions and remains alert, he may be able to participate in exercise program to improve pharyngeal swallow function.  Recommend pt remain NPO at present with alternate means of nutrition, hydration, and medication.  Allow ice chips for pt comfort after good oral care, in moderation, when pt is fully awake/alert, with supervision and upright positioning.   Swallow Evaluation Recommendations       SLP Diet Recommendations: NPO;Alternative means - temporary;Ice chips PRN after oral care       Medication Administration: Via alternative means               Oral Care Recommendations: Oral care QID        Celedonio Savage, [place], [place] Office: [phone] 11/06/2018,3:24 PM

## 2018-11-06 NOTE — Progress Notes (Addendum)
PT Cancellation Note  Patient Details Name: [name] [name] MRN: [id number] DOB: Dec 26, 1940   Cancelled Treatment:    Reason Eval/Treat Not Completed: Other (comment)    Orders received, chart reviewed;   We need more information re: weight bearing status and any specific motion restrictions to adhere to before proceeding with PT eval;   Have reached out to Dr. Lynetta Mare (thanks!)  Would like an Op Note, and weight bearing status and any needed motion restrictions/precautions to be put in order section to proceed with mobilizing.  Will follow,  Roney Marion, PT  Acute Rehabilitation Services Pager [phone] Office [place] 11/06/2018, 8:29 AM

## 2018-11-06 NOTE — Progress Notes (Addendum)
NAME:  [name] [name], MRN:  [id number], DOB:  09/16/1941, LOS: 4 ADMISSION DATE:  11/02/2018, CONSULTATION DATE:  11/02/18 REFERRING MD:  Benson Norway  CHIEF COMPLAINT:  Left Hip Fx   Brief History   This is a 78 year old male with a history of Afib on coumain, COPD s/p LLL pneumonectomy, OSA, DM, HTN, HLD, GERD, gout, and memory difficulties with recent surgical fixation of the left femoral neck at [facility] who was extubated in the OR but required re-intubation for hypoxia and increased WOB. Transferred to [facility] for further management.   History of present illness   Pt is encephelopathic; therefore, this HPI is obtained from chart review. [name] [name] is a 78 y.o. male who has a PMH as outlined below (see "past medical history").  He presented to [facility] ED 1/12 after a fall in his driveway.  He complained of left hip pain and was found to have a left femoral neck fracture.  He was held in ED there while awaiting at bed at [facility].  There was no bed availability at [facility]; therefore, he was admitted to [facility] and underwent surgical fixation 1/14.  Per his wife, he was extubated in the OR; however, required reintubation shortly thereafter due to hypoxia and increased WOB.  There was talk of possible extubation 1/16; however, wife states they were told that he might have aspirated or be developing PNA; therefore, he remained intubated and was transferred to [facility] later that evening.  Past Medical History   Past Medical History:  Diagnosis Date  . Arthritis   . Bronchiectasis    L lung lobe removed  . Chronic bronchitis ([place])   . COPD (chronic obstructive pulmonary disease) ([place])   . Essential hypertension, benign   . GERD (gastroesophageal reflux disease)   . History of gout   . Hypercholesteremia   . Impaired vision    Wears contacts or glasses  . Left atrial enlargement    LA size 59mm by echo  . Memory difficulties    Forgetfullness, taking Aricept preventatively, father had Alzheimers    . Nephrolithiasis   . Obesity   . OSA (obstructive sleep apnea)   . Persistent atrial fibrillation   . Recurrent upper respiratory infection (URI)   . Type 2 diabetes mellitus ([place])      Significant Hospital Events   1/12 > presented to [facility] 1/14 > left hip surgery 1/16 > transferred to [facility].  Consults:  Ortho  Procedures:  1/14 > left hip surgery  Significant Diagnostic Tests:  CT head 1/12 > negative  CXR  1/16 > Opacification of the left hemithorax  Micro Data:  Blood cultures 1/16 >  NGTD Sputum cultures 1/16 > Reincubated for better growth, abundant gram positive cocci and gram negative rods  Antimicrobials:  Zosyn 1/16 >  11/05/18 Rocephin 1/16 >  Flagyl 1/16 > 1/17  Interim history/subjective:  Patient was extubated successfully yesterday, today he reports that he is doing okay. He does report that he has a headache right now. He denies any shortness of breath or chest pain.   Wife is at bedside and reports that patients mental status is back to his baseline, he has a history of dementia and is occasionally confused.   Objective   Blood pressure 108/69, pulse 76, temperature 98.1 F (36.7 C), temperature source Oral, resp. rate (!) 27, height 6\' 3"  (1.905 m), weight 126.8 kg, SpO2 100 %.    Vent Mode: PRVC FiO2 (%):  [40 %-  55 %] 55 % Set Rate:  [18 bmp] 18 bmp Vt Set:  [670 mL] 670 mL PEEP:  [5 cmH20] 5 cmH20 Pressure Support:  [10 cmH20] 10 cmH20 Plateau Pressure:  [20 cmH20-23 cmH20] 23 cmH20   Intake/Output Summary (Last 24 hours) at 11/06/2018 0628 Last data filed at 11/06/2018 0600 Gross per 24 hour  Intake 903.41 ml  Output 3200 ml  Net -2296.59 ml   Filed Weights   11/04/18 0411 11/05/18 0500 11/06/18 0445  Weight: 127.6 kg 127.5 kg 126.8 kg    Examination: General: Awake, alert, following commands HENT: Normocephalic, atraumatic Lungs: Bibasilar crackles, decreased breath sounds over left lung base.  Cardiovascular: Irregular rhythm,  regular rate, no m/r/g Abdomen: soft, no guarding Extremities: Left hip in bandage, right lower extremity warm and dry, trace edema bilaterally Neuro: Awake, alert, following commands GU: Foley in place  Resolved Hospital Problem list     Assessment & Plan:   Acute hypoxic and hypercapnic respiratory failure COPD, s/p LLL pneumonectomy, OSA Possible aspiration pneumonia: Bronchiectasis: Patient was extubated yesterday, appears to be tolerating the supplemental oxygen well. He still has some tachypnea and is requiring 5L [place] however on exam he does not have any accessory muscle use. He still has some bilateral rhonchi on exam. He is still on rocephin and we will continue this to complete the 7 day course for the aspiration pnuemonia. He does have some bibasilar crackles on exam, may be related to fluid overload vs atelectasis.  -Continue duonebs -Incentive spirometry -20 mg IV lasix once -Complete 7 days of rocephin -Daily CBC -PT/OT -SLP and start diet if he passes -Tylenol PRN -Stable to transfer out of ICU  Left hip surgery: -Performed at [facility], no records present -Dr. Case is the orthopedic who performed the surgery -Ortho following   Acute vs chronic kidney injury: -Cr today was normal, improvement from his admission Cr which was 2.51. This was likely 2/2 pre-renal azotemia.   Thrombocytopenia: -Platelets today was 160, 89 on admission. It appears that it was around this level in 2013. Will continue to monitor. No signs of active bleeding, Hgb has been stable. This appears to be stable.  -Daily CBC  Afib: -Currently  rate controlled -Continue diltiazem and warfarin  H/o hypomagnesemia -Magnesium today was 1.7 -Replete as needed  BPH: -Patient is on proscar and vesicare at home.  -Currently has indwelling catheter  Diabetes mellitus: -On SSI -CBGs range from 140-180s -Continue Lantus to 7 units daily -Frequent CBGs  Memory issues/ dementia -Continue  aricept  Best practice:  Diet: NPO until SLP Pain/Anxiety/Delirium protocol (if indicated): N/A VAP protocol (if indicated): N/A DVT prophylaxis: SCD's / Warfarin GI prophylaxis: PPI. Glucose control: SSI. Mobility: Bedrest, up with assistance Code Status: Full. Family Communication: Discussed with wife and patient Disposition: Transfer out of ICU   Labs   CBC: Recent Labs  Lab 11/02/18 2034 11/03/18 0540 11/05/18 0107 11/06/18 0248  WBC 21.6* 17.2* 17.7* 13.9*  HGB 8.4* 10.9* 10.3* 10.7*  HCT 26.4* 35.6* 32.7* 34.4*  MCV 91.3 88.6 86.5 87.1  PLT 81* 105* 122* 448    Basic Metabolic Panel: Recent Labs  Lab 11/02/18 2148 11/03/18 0540 11/04/18 0401 11/04/18 1618 11/05/18 0107 11/06/18 0248  NA 138 137 140 141 141 145  K 5.1 4.5 3.9 4.1 3.5 3.6  CL 106 108 110 108 107 106  CO2 18* 17* 19* 21* 23 27  GLUCOSE 270* 241* 263* 291* 263* 150*  BUN 42* 48* 50* 45* 43*  33*  CREATININE 2.51* 2.36* 1.44* 1.22 1.12 0.96  CALCIUM 7.9* 7.6* 7.8* 8.2* 8.2* 8.6*  MG 1.8 1.7  --   --   --   --   PHOS 4.8* 4.6  --   --   --   --    GFR: Estimated Creatinine Clearance: 93.9 mL/min (by C-G formula based on SCr of 0.96 mg/dL). Recent Labs  Lab 11/02/18 2034 11/03/18 0540 11/05/18 0107 11/06/18 0248  WBC 21.6* 17.2* 17.7* 13.9*    Liver Function Tests: Recent Labs  Lab 11/02/18 2148 11/04/18 0401  AST 143* 94*  ALT 85* 127*  ALKPHOS 55 59  BILITOT 1.4* 0.7  PROT 5.4* 5.1*  ALBUMIN 2.4* 1.9*   No results for input(s): LIPASE, AMYLASE in the last 168 hours. No results for input(s): AMMONIA in the last 168 hours.  ABG    Component Value Date/Time   PHART 7.331 (L) 11/03/2018 0432   PCO2ART 33.9 11/03/2018 0432   PO2ART 79.9 (L) 11/03/2018 0432   HCO3 17.5 (L) 11/03/2018 0432   TCO2 22 11/02/2018 2008   ACIDBASEDEF 7.4 (H) 11/03/2018 0432   O2SAT 94.8 11/03/2018 0432     Coagulation Profile: Recent Labs  Lab 11/02/18 2034 11/06/18 0248  INR 1.52 1.30      Cardiac Enzymes: No results for input(s): CKTOTAL, CKMB, CKMBINDEX, TROPONINI in the last 168 hours.  HbA1C: No results found for: HGBA1C  CBG: Recent Labs  Lab 11/05/18 1143 11/05/18 1539 11/05/18 1944 11/06/18 0000 11/06/18 0415  GLUCAP 254* 211* 180* 140* 148*    Review of Systems:   Patient is currently sedated and intubated, unable to obtain.  Past Medical History  He,  has a past medical history of Arthritis, Bronchiectasis, Chronic bronchitis ([place]), COPD (chronic obstructive pulmonary disease) ([place]), Essential hypertension, benign, GERD (gastroesophageal reflux disease), History of gout, Hypercholesteremia, Impaired vision, Left atrial enlargement, Memory difficulties, Nephrolithiasis, Obesity, OSA (obstructive sleep apnea), Persistent atrial fibrillation, Recurrent upper respiratory infection (URI), and Type 2 diabetes mellitus ([place]).   Surgical History    Past Surgical History:  Procedure Laterality Date  . CARDIAC CATHETERIZATION  6/09   Normal coronaries  . CARDIOVERSION  08/2012   successfully restored to NSR  . CARDIOVERSION  08/26/2012   Procedure: CARDIOVERSION;  Surgeon: Thompson Grayer, MD;  Location: [facility] OR;  Service: Cardiovascular;  Laterality: N/A;  . INCISE AND DRAIN ABCESS  2006   Posterior neck  . [place] SURGERY  2010  . LUNG LOBECTOMY  1959   Left  . REPLACEMENT DISC ANTERIOR LUMBAR SPINE  2011   "put rods in" (08/23/2012)  . TONSILLECTOMY  ~ 1946  . TOTAL HIP ARTHROPLASTY  08/23/2011   Procedure: TOTAL HIP ARTHROPLASTY;  Surgeon: Rudean Haskell, MD;  Location: [facility];  Service: Orthopedics;  Laterality: Right;  . URETEROLITHOTOMY     stone removed from ureter     Social History   reports that he has never smoked. He has never used smokeless tobacco. He reports current alcohol use. He reports that he does not use drugs.   Family History   His family history includes Dementia in his father.   Allergies Allergies  Allergen Reactions  .  Contrast Media [Iodinated Diagnostic Agents] Rash  . Penicillins Rash    Did it involve swelling of the face/tongue/throat, SOB, or low BP? Unknown Did it involve sudden or severe rash/hives, skin peeling, or any reaction on the inside of your mouth or nose? Unknown Did you need  to seek medical attention at a hospital or doctor's office? Unknown When did it last happen? If all above answers are "NO", may proceed with cephalosporin use.   . Quinidine Rash  . Quinine Derivatives Rash    quinidine  . Sulfa Antibiotics Rash     Home Medications  Prior to Admission medications   Medication Sig Start Date End Date Taking? Authorizing Provider  albuterol (PROVENTIL HFA;VENTOLIN HFA) 108 (90 BASE) MCG/ACT inhaler Inhale 2 puffs into the lungs every 6 (six) hours as needed. For shortness of breath     [provider]  diltiazem (CARDIZEM CD) 180 MG 24 hr capsule Take 1 capsule (180 mg total) by mouth daily. 09/04/12   Allred, Jeneen Rinks, MD  donepezil (ARICEPT) 10 MG tablet Take 10 mg by mouth at bedtime.      [provider]  finasteride (PROSCAR) 5 MG tablet Take 5 mg by mouth at bedtime.     [provider]  furosemide (LASIX) 20 MG tablet Take 20 mg by mouth daily.  11/27/12   [provider]  glipiZIDE (GLUCOTROL XL) 5 MG 24 hr tablet Take 5 mg by mouth daily.      [provider]  HYDROcodone-acetaminophen (NORCO) 10-325 MG per tablet Take 1 tablet by mouth every 6 (six) hours as needed. For pain.    [provider]  lansoprazole (PREVACID) 15 MG capsule Take 15 mg by mouth 2 (two) times daily.      [provider]  lisinopril (PRINIVIL,ZESTRIL) 10 MG tablet Take 10 mg by mouth at bedtime.     [provider]  loratadine (CLARITIN) 10 MG tablet Take 10 mg by mouth daily.    [provider]  metFORMIN (GLUCOPHAGE) 500 MG tablet Take 250 mg by mouth 2 (two) times daily with a meal.     [provider]    Rivaroxaban (XARELTO) 20 MG TABS Take 1 tablet (20 mg total) by mouth daily at 8 pm. 11/15/12   Allred, Jeneen Rinks, MD  simvastatin (ZOCOR) 40 MG tablet Take 40 mg by mouth at bedtime.      [provider]  solifenacin (VESICARE) 10 MG tablet Take 10 mg by mouth at bedtime.    [provider]        Asencion Noble, M.D. PGY1 Pager [phone] 11/06/2018 6:28 AM

## 2018-11-06 NOTE — Evaluation (Signed)
Physical Therapy Evaluation Patient Details Name: [name] [name] MRN: [id number] DOB: 1940-11-11 Today's Date: 11/06/2018   History of Present Illness  This is a 78 year old male with a history of Afib on coumain, COPD s/p LLL pneumonectomy, OSA, DM, HTN, HLD, GERD, gout, and memory difficulties with recent surgical fixation of the left femoral neck at [facility] who was extubated in the OR but required re-intubation for hypoxia and increased WOB. Transferred to [facility] for further management. Extubated 1/19  Clinical Impression  Pt admitted with above diagnosis. Pt currently with functional limitations due to the deficits listed below (see PT Problem List). Independent with cane prior to admission, driving; Presents with decr funcitonal capacity, decr activity tolerance, Pain L hip, Posterior Hip Precautions; Currently requires Max assist with all aspects of mobility;  Pt will benefit from skilled PT to increase their independence and safety with mobility to allow discharge to the venue listed below.       Follow Up Recommendations SNF    Equipment Recommendations  Rolling walker with 5" wheels;3in1 (PT)    Recommendations for Other Services       Precautions / Restrictions Precautions Precautions: Fall;Posterior Hip Precaution Booklet Issued: Yes (comment) Precaution Comments: educated pt and wife in posterior hip precautions Required Braces or Orthoses: Other Brace Other Brace: LE foam wedge Restrictions Weight Bearing Restrictions: Yes LLE Weight Bearing: Weight bearing as tolerated      Mobility  Bed Mobility Overal bed mobility: Needs Assistance Bed Mobility: Supine to Sit;Sit to Supine     Supine to sit: +2 for physical assistance;Max assist Sit to supine: +2 for physical assistance;Max assist   General bed mobility comments: assist for all aspects; Close guard for prec  Transfers                 General transfer comment: deferred  Ambulation/Gait                 Stairs            Wheelchair Mobility    Modified Rankin (Stroke Patients Only)       Balance Overall balance assessment: Needs assistance Sitting-balance support: Feet supported;Bilateral upper extremity supported Sitting balance-Leahy Scale: Poor Sitting balance - Comments: requires min to max assist Postural control: Posterior lean;Right lateral lean                                   Pertinent Vitals/Pain Pain Assessment: Faces Faces Pain Scale: No hurt Pain Location: L hip with movement Pain Descriptors / Indicators: Grimacing;Guarding;Moaning Pain Intervention(s): Monitored during session;Premedicated before session    Home Living Family/patient expects to be discharged to:: Skilled nursing facility Living Arrangements: Spouse/significant other                    Prior Function Level of Independence: Independent with assistive device(s)         Comments: walks with a cane, independent in self care, drives, sings in his church choir     Hand Dominance   Dominant Hand: Right    Extremity/Trunk Assessment   Upper Extremity Assessment Upper Extremity Assessment: Defer to OT evaluation    Lower Extremity Assessment Lower Extremity Assessment: Generalized weakness;LLE deficits/detail LLE Deficits / Details: Grossly decr AROM and strength, limited by pain postop LLE: Unable to fully assess due to pain    Cervical / Trunk Assessment Cervical / Trunk Assessment: Kyphotic(with  forward head)  Communication   Communication: No difficulties  Cognition Arousal/Alertness: Awake/alert Behavior During Therapy: Flat affect Overall Cognitive Status: Impaired/Different from baseline Area of Impairment: Memory;Following commands;Problem solving                     Memory: Decreased short-term memory;Decreased recall of precautions Following Commands: Follows one step commands with increased time(and multimodal cues)      Problem Solving: Slow processing;Decreased initiation;Difficulty sequencing;Requires verbal cues;Requires tactile cues General Comments: pt with baseline memory deficits      General Comments General comments (skin integrity, edema, etc.): Session conducted on supplemental O2 3L; O2 sats ranged 85-97%; Encouraged cough while sitting up    Exercises     Assessment/Plan    PT Assessment Patient needs continued PT services  PT Problem List Decreased strength;Decreased range of motion;Decreased activity tolerance;Decreased balance;Decreased mobility;Decreased coordination;Decreased cognition;Decreased knowledge of use of DME;Decreased safety awareness;Decreased knowledge of precautions;Pain;Cardiopulmonary status limiting activity       PT Treatment Interventions DME instruction;Gait training;Functional mobility training;Therapeutic activities;Therapeutic exercise;Patient/family education;Balance training;Neuromuscular re-education;Cognitive remediation    PT Goals (Current goals can be found in the Care Plan section)  Acute Rehab PT Goals Patient Stated Goal: to regain strength PT Goal Formulation: With patient/family Time For Goal Achievement: 11/20/18 Potential to Achieve Goals: Fair    Frequency Min 2X/week   Barriers to discharge        Co-evaluation PT/OT/SLP Co-Evaluation/Treatment: Yes Reason for Co-Treatment: Complexity of the patient's impairments (multi-system involvement);For patient/therapist safety;To address functional/ADL transfers PT goals addressed during session: Mobility/safety with mobility OT goals addressed during session: ADL's and self-care       AM-PAC PT "6 Clicks" Mobility  Outcome Measure Help needed turning from your back to your side while in a flat bed without using bedrails?: Total Help needed moving from lying on your back to sitting on the side of a flat bed without using bedrails?: Total Help needed moving to and from a bed to a chair  (including a wheelchair)?: Total Help needed standing up from a chair using your arms (e.g., wheelchair or bedside chair)?: Total Help needed to walk in hospital room?: Total Help needed climbing 3-5 steps with a railing? : Total 6 Click Score: 6    End of Session Equipment Utilized During Treatment: Other (comment);Oxygen(bed pad) Activity Tolerance: Patient tolerated treatment well Patient left: in bed;with call bell/phone within reach;with bed alarm set;with family/visitor present;Other (comment)(bed in semi-chair position)   PT Visit Diagnosis: Unsteadiness on feet (R26.81);Other abnormalities of gait and mobility (R26.89);Muscle weakness (generalized) (M62.81);History of falling (Z91.81);Pain;Other (comment)(decr functional capacity) Pain - Right/Left: Left Pain - part of body: Hip    Time: 1333-1406 PT Time Calculation (min) (ACUTE ONLY): 33 min   Charges:   PT Evaluation $PT Eval Moderate Complexity: [place], [place]  Acute Rehabilitation Services Pager [phone] Office [phone]'   Colletta [place] 11/06/2018, 3:58 PM

## 2018-11-06 NOTE — Progress Notes (Signed)
ANTICOAGULATION CONSULT NOTE - Follow Up Consult  Pharmacy Consult for Heparin Indication: atrial fibrillation  Allergies  Allergen Reactions  . Contrast Media [Iodinated Diagnostic Agents] Rash  . Penicillins Rash    Did it involve swelling of the face/tongue/throat, SOB, or low BP? Unknown Did it involve sudden or severe rash/hives, skin peeling, or any reaction on the inside of your mouth or nose? Unknown Did you need to seek medical attention at a hospital or doctor's office? Unknown When did it last happen? If all above answers are "NO", may proceed with cephalosporin use.   . Quinidine Rash  . Quinine Derivatives Rash    quinidine  . Sulfa Antibiotics Rash    Patient Measurements: Height: 6\' 3"  (190.5 cm) Weight: 279 lb 8.7 oz (126.8 kg) IBW/kg (Calculated) : 84.5 Heparin Dosing Weight: 112 kg  Vital Signs: Temp: 98.3 F (36.8 C) (01/20 0746) Temp Source: Axillary (01/20 0746) BP: 134/82 (01/20 0900) Pulse Rate: 84 (01/20 0900)  Labs: Recent Labs    11/04/18 1618 11/05/18 0107 11/05/18 0926 11/06/18 0248  HGB  --  10.3*  --  10.7*  HCT  --  32.7*  --  34.4*  PLT  --  122*  --  160  LABPROT  --   --   --  16.0*  INR  --   --   --  1.30  HEPARINUNFRC  --  0.33 0.36 0.31  CREATININE 1.22 1.12  --  0.96    Estimated Creatinine Clearance: 93.9 mL/min (by C-G formula based on SCr of 0.96 mg/dL).   Medications:  Scheduled:  . arformoterol  15 mcg Nebulization BID  . budesonide (PULMICORT) nebulizer solution  0.25 mg Nebulization BID  . chlorhexidine  15 mL Mouth Rinse BID  . diltiazem  45 mg Per Tube Q6H  . donepezil  5 mg Oral QHS  . finasteride  5 mg Oral Daily  . insulin aspart  0-15 Units Subcutaneous Q4H  . insulin glargine  7 Units Subcutaneous Daily  . ipratropium-albuterol  3 mL Nebulization Q6H  . mouth rinse  15 mL Mouth Rinse q12n4p  . simvastatin  40 mg Per Tube q1800  . warfarin  7.5 mg Oral ONCE-1800  . Warfarin - Pharmacist Dosing  Inpatient   Does not apply q1800    Assessment: 52 yoM [facility]/p [facility] and underwent fixation for hip fracture on 10/31/18, required re-intubation in the PACU and was transferred to [facility] for further management. PMH s/f afib on warfarin PTA. Patient was started on IV heparin at 1560 units/hr at OSH prior to transfer. Pharmacy consulted to continue dosing IV heparin. Heparin level this AM is within goal range at 0.46. Hgb stable at 10.7, pltc up 160. No bleeding noted. Patient's INR this morning at 1.30; Patient was supposed to receive warfarin 7.5mg  yesterday but was not given due to NPO status.   Goal of Therapy:  Heparin level 0.3-0.7 units/ml Monitor platelets by anticoagulation protocol: Yes   Plan:  Continue heparin at 2100 units / hr as previously noted.  Warfarin 7.5mg  per tube x1 tonight INR tomorrow  Thank you for allowing pharmacy to be a part of this patient's care.  Tamela Gammon, PharmD 11/06/2018 12:07 PM PGY-1 Pharmacy Resident Direct Phone: [phone] Please check [email or web address] for unit-specific pharmacist phone numbers

## 2018-11-06 NOTE — Evaluation (Signed)
Occupational Therapy Evaluation Patient Details Name: [name] [name] MRN: [id number] DOB: 08-03-1941 Today's Date: 11/06/2018    History of Present Illness This is a 78 year old male with a history of Afib on coumain, COPD s/p LLL pneumonectomy, OSA, DM, HTN, HLD, GERD, gout, and memory difficulties with recent surgical fixation of the left femoral neck at [facility] who was extubated in the OR but required re-intubation for hypoxia and increased WOB. Transferred to [facility] for further management.    Clinical Impression   Pt lives with his wife, walks with a cane, drives and is independent in self care. He has baseline memory deficits. Pt presents with generalized weakness, impaired ability to follow commands with slow processing and poor sitting balance. He tolerated supported sitting at EOB x 10 minutes with stable VS on 3L 02. Pt requires +2 assist for bed level mobility. He will need post acute rehab prior to return home. Recommending SNF. Will follow acutely.    Follow Up Recommendations  SNF;Supervision/Assistance - 24 hour    Equipment Recommendations  3 in 1 bedside commode;Wheelchair (measurements OT);Wheelchair cushion (measurements OT)    Recommendations for Other Services       Precautions / Restrictions Precautions Precautions: Fall;Posterior Hip Precaution Comments: educated pt and wife in posterior hip precautions Required Braces or Orthoses: Other Brace Other Brace: LE foam wedge Restrictions Weight Bearing Restrictions: Yes LLE Weight Bearing: Weight bearing as tolerated      Mobility Bed Mobility Overal bed mobility: Needs Assistance Bed Mobility: Supine to Sit;Sit to Supine     Supine to sit: +2 for physical assistance;Max assist Sit to supine: +2 for physical assistance;Max assist   General bed mobility comments: assist for all aspects  Transfers                 General transfer comment: deferred    Balance Overall balance assessment: Needs  assistance Sitting-balance support: Feet supported;Bilateral upper extremity supported Sitting balance-Leahy Scale: Poor Sitting balance - Comments: requires min to max assist Postural control: Posterior lean;Right lateral lean                                 ADL either performed or assessed with clinical judgement   ADL                                         General ADL Comments: Pt is NPO, can bring hand to face, dependent in all ADL.     Vision Patient Visual Report: No change from baseline       Perception     Praxis      Pertinent Vitals/Pain Pain Assessment: Faces Faces Pain Scale: Hurts even more Pain Location: L hip with movement Pain Descriptors / Indicators: Grimacing;Guarding;Moaning Pain Intervention(s): Monitored during session;Premedicated before session     Hand Dominance Right   Extremity/Trunk Assessment Upper Extremity Assessment Upper Extremity Assessment: Generalized weakness   Lower Extremity Assessment Lower Extremity Assessment: Defer to PT evaluation   Cervical / Trunk Assessment Cervical / Trunk Assessment: Kyphotic(with forward head)   Communication Communication Communication: No difficulties   Cognition Arousal/Alertness: Awake/alert Behavior During Therapy: Flat affect Overall Cognitive Status: Impaired/Different from baseline Area of Impairment: Memory;Following commands;Problem solving  Memory: Decreased short-term memory;Decreased recall of precautions Following Commands: Follows one step commands with increased time(and multimodal cues)     Problem Solving: Slow processing;Decreased initiation;Difficulty sequencing;Requires verbal cues;Requires tactile cues General Comments: pt with baseline memory deficits   General Comments       Exercises     Shoulder Instructions      Home Living Family/patient expects to be discharged to:: Skilled nursing facility Living  Arrangements: Spouse/significant other                                      Prior Functioning/Environment Level of Independence: Independent with assistive device(s)        Comments: walks with a cane, independent in self care, drives, sings in his church choir        OT Problem List: Decreased strength;Decreased activity tolerance;Impaired balance (sitting and/or standing);Decreased cognition;Decreased safety awareness;Decreased knowledge of use of DME or AE;Pain      OT Treatment/Interventions: Self-care/ADL training;DME and/or AE instruction;Patient/family education;Balance training;Therapeutic activities;Neuromuscular education;Cognitive remediation/compensation    OT Goals(Current goals can be found in the care plan section) Acute Rehab OT Goals Patient Stated Goal: to regain strength OT Goal Formulation: With patient/family Time For Goal Achievement: 11/20/18 Potential to Achieve Goals: Good ADL Goals Pt Will Perform Eating: with set-up;sitting(once cleared for PO) Pt Will Perform Grooming: with set-up;with supervision;sitting Pt Will Perform Upper Body Bathing: with min assist;sitting Pt Will Perform Upper Body Dressing: with min assist;sitting Pt Will Transfer to Toilet: with +2 assist;with mod assist;stand pivot transfer;bedside commode Additional ADL Goal #1: Pt will demonstrate fair sitting balance at EOB in preparation for ADL. Additional ADL Goal #2: Pt will perform bed mobility with moderate assistance in preparation for ADL.  OT Frequency: Min 2X/week   Barriers to D/C:            Co-evaluation PT/OT/SLP Co-Evaluation/Treatment: Yes Reason for Co-Treatment: For patient/therapist safety   OT goals addressed during session: ADL's and self-care      AM-PAC OT "6 Clicks" Daily Activity     Outcome Measure Help from another person eating meals?: Total Help from another person taking care of personal grooming?: A Lot Help from another person  toileting, which includes using toliet, bedpan, or urinal?: Total Help from another person bathing (including washing, rinsing, drying)?: Total Help from another person to put on and taking off regular upper body clothing?: Total Help from another person to put on and taking off regular lower body clothing?: Total 6 Click Score: 7   End of Session Equipment Utilized During Treatment: Oxygen(3L) Nurse Communication: Mobility status  Activity Tolerance: Patient tolerated treatment well Patient left: in bed;with call bell/phone within reach;with bed alarm set;with family/visitor present  OT Visit Diagnosis: Pain;Other symptoms and signs involving cognitive function;Muscle weakness (generalized) (M62.81);History of falling (Z91.81) Pain - Right/Left: Left Pain - part of body: Hip                Time: 1333-1406 OT Time Calculation (min): 33 min Charges:  OT General Charges $OT Visit: 1 Visit OT Evaluation $OT Eval Moderate Complexity: 1 Mod  Nestor Lewandowsky, OTR/L Acute Rehabilitation Services Pager: [phone] Office: [phone]  Malka So 11/06/2018, 2:31 PM

## 2018-11-06 NOTE — Evaluation (Signed)
Clinical/Bedside Swallow Evaluation Patient Details  Name: [name] [name] MRN: [id number] Date of Birth: Aug 10, 1941  Today's Date: 11/06/2018 Time: SLP Start Time (ACUTE ONLY): 0277 SLP Stop Time (ACUTE ONLY): 0955 SLP Time Calculation (min) (ACUTE ONLY): 8 min  Past Medical History:  Past Medical History:  Diagnosis Date  . Arthritis   . Bronchiectasis    L lung lobe removed  . Chronic bronchitis ([place])   . COPD (chronic obstructive pulmonary disease) ([place])   . Essential hypertension, benign   . GERD (gastroesophageal reflux disease)   . History of gout   . Hypercholesteremia   . Impaired vision    Wears contacts or glasses  . Left atrial enlargement    LA size 86mm by echo  . Memory difficulties    Forgetfullness, taking Aricept preventatively, father had Alzheimers  . Nephrolithiasis   . Obesity   . OSA (obstructive sleep apnea)   . Persistent atrial fibrillation   . Recurrent upper respiratory infection (URI)   . Type 2 diabetes mellitus ([place])    Past Surgical History:  Past Surgical History:  Procedure Laterality Date  . CARDIAC CATHETERIZATION  6/09   Normal coronaries  . CARDIOVERSION  08/2012   successfully restored to NSR  . CARDIOVERSION  08/26/2012   Procedure: CARDIOVERSION;  Surgeon: Thompson Grayer, MD;  Location: [facility] OR;  Service: Cardiovascular;  Laterality: N/A;  . INCISE AND DRAIN ABCESS  2006   Posterior neck  . [place] SURGERY  2010  . LUNG LOBECTOMY  1959   Left  . REPLACEMENT DISC ANTERIOR LUMBAR SPINE  2011   "put rods in" (08/23/2012)  . TONSILLECTOMY  ~ 1946  . TOTAL HIP ARTHROPLASTY  08/23/2011   Procedure: TOTAL HIP ARTHROPLASTY;  Surgeon: Rudean Haskell, MD;  Location: [place];  Service: Orthopedics;  Laterality: Right;  . URETEROLITHOTOMY     stone removed from ureter   HPI:  This is a 78 year old male with a history of Afib on coumain, COPD s/p LLL pneumonectomy, OSA, DM, HTN, HLD, GERD, gout, and memory difficulties with recent surgical  fixation of the left femoral neck at [facility] who was extubated in the OR but required re-intubation for hypoxia and increased WOB. Transferred to [facility] for further management.  Pt intubated for a 6 days.  With extubation and reintubation the day of surgery.  CXR 1/18: "Extensive bilateral airspace disease left greater than right"  Head CT 1/17: no acute findings   Assessment / Plan / Recommendation Clinical Impression  Pt presents with clinical indicators of pharyngeal dysphagia s/p extubation.  Pt has low intensity vocal quality.  With PO trials pt required multiple swallows (5+) with both thin liquid and puree.  There was immediate wet cough with thin liquids.  Recommend instrumental swallowing assessment prior to initiation of PO diet.  MBSS has been planned for later this date. SLP Visit Diagnosis: Dysphagia, oropharyngeal phase (R13.12)    Aspiration Risk  Moderate aspiration risk    Diet Recommendation NPO;Ice chips PRN after oral care   Medication Administration: Via alternative means    Other  Recommendations Oral Care Recommendations: Oral care QID     Swallow Study   General Date of Onset: 10/31/18 HPI: This is a 78 year old male with a history of Afib on coumain, COPD s/p LLL pneumonectomy, OSA, DM, HTN, HLD, GERD, gout, and memory difficulties with recent surgical fixation of the left femoral neck at [facility] who was extubated in the OR  but required re-intubation for hypoxia and increased WOB. Transferred to [facility] for further management.  Pt intubated for a 6 days.  With extubation and reintubation the day of surgery.  CXR 1/18: "Extensive bilateral airspace disease left greater than right"  Head CT 1/17: no acute findings Type of Study: Bedside Swallow Evaluation Diet Prior to this Study: NPO Temperature Spikes Noted: No Respiratory Status: Nasal cannula History of Recent Intubation: Yes Length of Intubations (days): 6 days Date extubated: 11/05/18 Behavior/Cognition:  Alert;Cooperative Oral Cavity Assessment: Dry Oral Cavity - Dentition: Adequate natural dentition Patient Positioning: Upright in bed Baseline Vocal Quality: Breathy;Hoarse Volitional Cough: (Fair) Volitional Swallow: Able to elicit(reduced laryngeal elevation)    Oral/Motor/Sensory Function Overall Oral Motor/Sensory Function: Mild impairment Facial ROM: Within Functional Limits Facial Symmetry: Within Functional Limits Lingual ROM: (reduced protrusion) Lingual Symmetry: Within Functional Limits Lingual Strength: Within Functional Limits Velum: Within Functional Limits Mandible: Within Functional Limits   Ice Chips Ice chips: Within functional limits Presentation: Spoon   Thin Liquid Thin Liquid: Impaired Presentation: Cup Pharyngeal  Phase Impairments: Decreased hyoid-laryngeal movement;Multiple swallows;Cough - Immediate;Wet Vocal Quality    Nectar Thick Nectar Thick Liquid: Not tested   Honey Thick Honey Thick Liquid: Not tested   Puree Puree: Impaired Presentation: Spoon Pharyngeal Phase Impairments: Decreased hyoid-laryngeal movement;Multiple swallows   Solid     Solid: Not tested      Celedonio Savage, MA, [place] Office: [phone]; Pager (1/20): [phone] 11/06/2018,10:08 AM

## 2018-11-07 DIAGNOSIS — J479 Bronchiectasis, uncomplicated: Secondary | ICD-10-CM

## 2018-11-07 DIAGNOSIS — N179 Acute kidney failure, unspecified: Secondary | ICD-10-CM

## 2018-11-07 DIAGNOSIS — N183 Chronic kidney disease, stage 3 unspecified: Secondary | ICD-10-CM

## 2018-11-07 DIAGNOSIS — Z96642 Presence of left artificial hip joint: Secondary | ICD-10-CM

## 2018-11-07 DIAGNOSIS — D696 Thrombocytopenia, unspecified: Secondary | ICD-10-CM

## 2018-11-07 DIAGNOSIS — N400 Enlarged prostate without lower urinary tract symptoms: Secondary | ICD-10-CM

## 2018-11-07 LAB — CBC
HCT: 36.6 % — ABNORMAL LOW (ref 39.0–52.0)
Hemoglobin: 11.2 g/dL — ABNORMAL LOW (ref 13.0–17.0)
MCH: 26.9 pg (ref 26.0–34.0)
MCHC: 30.6 g/dL (ref 30.0–36.0)
MCV: 87.8 fL (ref 80.0–100.0)
Platelets: 187 10*3/uL (ref 150–400)
RBC: 4.17 MIL/uL — ABNORMAL LOW (ref 4.22–5.81)
RDW: 17.2 % — ABNORMAL HIGH (ref 11.5–15.5)
WBC: 13.4 10*3/uL — ABNORMAL HIGH (ref 4.0–10.5)
nRBC: 0.7 % — ABNORMAL HIGH (ref 0.0–0.2)

## 2018-11-07 LAB — GLUCOSE, CAPILLARY
GLUCOSE-CAPILLARY: 139 mg/dL — AB (ref 70–99)
Glucose-Capillary: 125 mg/dL — ABNORMAL HIGH (ref 70–99)
Glucose-Capillary: 137 mg/dL — ABNORMAL HIGH (ref 70–99)
Glucose-Capillary: 135 mg/dL — ABNORMAL HIGH (ref 70–99)
Glucose-Capillary: 134 mg/dL — ABNORMAL HIGH (ref 70–99)
Glucose-Capillary: 128 mg/dL — ABNORMAL HIGH (ref 70–99)

## 2018-11-07 LAB — CULTURE, BLOOD (ROUTINE X 2)
Culture: NO GROWTH
Culture: NO GROWTH
Special Requests: ADEQUATE

## 2018-11-07 LAB — BASIC METABOLIC PANEL
Anion gap: 11 (ref 5–15)
BUN: 27 mg/dL — ABNORMAL HIGH (ref 8–23)
CO2: 28 mmol/L (ref 22–32)
Calcium: 9 mg/dL (ref 8.9–10.3)
Chloride: 105 mmol/L (ref 98–111)
Creatinine, Ser: 0.93 mg/dL (ref 0.61–1.24)
GFR calc Af Amer: >60 mL/min (ref >60)
GFR calc non Af Amer: >60 mL/min (ref >60)
Glucose, Bld: 109 mg/dL — ABNORMAL HIGH (ref 70–99)
Potassium: 3.6 mmol/L (ref 3.5–5.1)
Sodium: 144 mmol/L (ref 135–145)

## 2018-11-07 LAB — CALCIUM, IONIZED: Calcium, Ionized, Serum: 4 mg/dL — ABNORMAL LOW (ref 4.5–5.6)

## 2018-11-07 LAB — PROTIME-INR
INR: 1.32
Prothrombin Time: 16.3 seconds — ABNORMAL HIGH (ref 11.4–15.2)

## 2018-11-07 LAB — MAGNESIUM: Magnesium: 2 mg/dL (ref 1.7–2.4)

## 2018-11-07 LAB — HEPARIN LEVEL (UNFRACTIONATED): Heparin Unfractionated: 0.41 IU/mL (ref 0.30–0.70)

## 2018-11-07 MED ORDER — WARFARIN SODIUM 7.5 MG PO TABS
7.5000 mg | ORAL_TABLET | Freq: Once | ORAL | Status: DC
  Filled 2018-11-07: qty 1

## 2018-11-07 MED ORDER — SODIUM CHLORIDE 0.9 % IV SOLN
INTRAVENOUS | Status: DC
  Administered 2018-11-08: 10:00:00 via INTRAVENOUS

## 2018-11-07 NOTE — Progress Notes (Signed)
Physical Therapy Treatment Patient Details Name: [name] [name] MRN: [id number] DOB: [birth year] Today's Date: 11/07/2018    History of Present Illness This is a 78 year old male with a history of Afib on coumain, COPD s/p LLL pneumonectomy, OSA, DM, HTN, HLD, GERD, gout, and memory difficulties with recent surgical fixation of the left femoral neck at [facility] who was extubated in the OR but required re-intubation for hypoxia and increased WOB. Transferred to [facility] for further management.     PT Comments    Continuing work on functional mobility and activity tolerance;  This session focused on L hip therex with goal of hip muscle recruitment/stability and joint mobility within precautions; noted muscle activation throughout therex, but quite weak; Requires multimodal cues for better form during therex, and to attend to task of multiple reps; I am planning to return later today to hopefull try standing from bed in egress position  Follow Up Recommendations  SNF     Equipment Recommendations  Rolling walker with 5" wheels;3in1 (PT)    Recommendations for Other Services       Precautions / Restrictions Precautions Precautions: Fall;Posterior Hip Precaution Booklet Issued: Yes (comment) Precaution Comments: Reviewed Post Hip Prec Required Braces or Orthoses: Other Brace Other Brace: LE foam wedge Restrictions LLE Weight Bearing: Weight bearing as tolerated    Mobility  Bed Mobility                  Transfers                    Ambulation/Gait                 Stairs             Wheelchair Mobility    Modified Rankin (Stroke Patients Only)       Balance                                            Cognition Arousal/Alertness: Awake/alert(but sleepy) Behavior During Therapy: Flat affect Overall Cognitive Status: Impaired/Different from baseline Area of Impairment: Memory;Following commands;Problem solving                      Memory: Decreased short-term memory;Decreased recall of precautions Following Commands: Follows one step commands with increased time     Problem Solving: Slow processing;Decreased initiation;Difficulty sequencing;Requires verbal cues;Requires tactile cues General Comments: pt with baseline memory deficits      Exercises Total Joint Exercises Quad Sets: AROM;Right;10 reps(weak) Gluteal Sets: AROM;Both;10 reps Towel Squeeze: AROM;Both;10 reps Short Arc QuadSinclair [name];Left;10 reps Heel Slides: AAROM;Left;10 reps Hip ABduction/ADduction: AAROM;Left;10 reps    General Comments General comments (skin integrity, edema, etc.): Initially, O2 sats 88%, but noted [place] was out; supplemental O2 replaced and sats reamined greater than or equal to 94% rest of the session      Pertinent Vitals/Pain Pain Assessment: Faces Faces Pain Scale: Hurts a little bit Pain Location: L hip with movement Pain Descriptors / Indicators: Grimacing;Guarding;Moaning Pain Intervention(s): Monitored during session    Home Living                      Prior Function            PT Goals (current goals can now be found in the care plan section) Acute Rehab PT Goals Patient  Stated Goal: to regain strength PT Goal Formulation: With patient/family Time For Goal Achievement: 11/20/18 Potential to Achieve Goals: Fair Progress towards PT goals: Progressing toward goals    Frequency    Min 2X/week      PT Plan Current plan remains appropriate    Co-evaluation              AM-PAC PT "6 Clicks" Mobility   Outcome Measure  Help needed turning from your back to your side while in a flat bed without using bedrails?: Total Help needed moving from lying on your back to sitting on the side of a flat bed without using bedrails?: Total Help needed moving to and from a bed to a chair (including a wheelchair)?: Total Help needed standing up from a chair using your arms (e.g.,  wheelchair or bedside chair)?: Total Help needed to walk in hospital room?: Total Help needed climbing 3-5 steps with a railing? : Total 6 Click Score: 6    End of Session Equipment Utilized During Treatment: Oxygen Activity Tolerance: Patient tolerated treatment well Patient left: in bed;with call bell/phone within reach   PT Visit Diagnosis: Unsteadiness on feet (R26.81);Other abnormalities of gait and mobility (R26.89);Muscle weakness (generalized) (M62.81);History of falling (Z91.81);Pain;Other (comment)(decr functional capacity) Pain - Right/Left: Left Pain - part of body: Hip     Time: 1203-1219 PT Time Calculation (min) (ACUTE ONLY): 16 min  Charges:  $Therapeutic Exercise: 8-22 mins                     Roney Marion, PT  Acute Rehabilitation Services Pager [phone] Office [place] 11/07/2018, 1:29 PM

## 2018-11-07 NOTE — Clinical Social Work Note (Signed)
Clinical Social Work Assessment  Patient Details  Name: [name] [name] MRN: [id number] Date of Birth: 1941-04-26  Date of referral:  11/07/18               Reason for consult:  Facility Placement                Permission sought to share information with:  Family Supports Permission granted to share information::  Yes, Verbal Permission Granted  Name::     [name] [name]  Agency::  family  Relationship::  spouse  Contact Information:  [name] [name] [phone]  Housing/Transportation Living arrangements for the past 2 months:  Single Family Home(with wife and mother in [place]. ) Source of Information:  Spouse Patient Interpreter Needed:    Criminal Activity/Legal Involvement Pertinent to Current Situation/Hospitalization:  No - Comment as needed Significant Relationships:  Spouse Lives with:  Self, Spouse Do you feel safe going back to the place where you live?  Yes Need for family participation in patient care:  Yes (Comment)  Care giving concerns:  CSW consulted as pt is being recommended for SNF placement. CSW met with pt and wife in room to discuss this further.   Social Worker assessment / plan:  CSW spoke with pt and wife at bedside. Pt ws asleep therefore CSW spoke with pt's wife [name] [name] for this assessment. During this assessment wife made great eye contact and engaged in conversation with CSW concerning the needs of pt. Wife expressed that she has been speaking with MD's and was informed that pt will need SNF placement. Wife expressed that pt is from home with her and her mother in law. Wife expressed she has been caring for mother and helping pt at best. Wife report that she is interested in SNF placement for pt at [facility] as she doesn't drive and that is the facility that is closes to her.   Wife asked questions to gain clarity of pt's need per PT.   Employment status:  Retired Forensic scientist:  Medicare PT Recommendations:  [place] /  Referral to community resources:  [place]  Patient/Family's Response to care:  Wife's response to care appeared understanding and agreeable at this time.   Patient/Family's Understanding of and Emotional Response to Diagnosis, Current Treatment, and Prognosis:  At this time no further questions or concerns have been presented to CSW.   Emotional Assessment Appearance:  Appears stated age Attitude/Demeanor/Rapport:  Unable to Assess Affect (typically observed):  Unable to Assess Orientation:  Oriented to Self, Oriented to Place, Oriented to  Time Alcohol / Substance use:  Not Applicable Psych involvement (Current and /or in the community):  No (Comment)  Discharge Needs  Concerns to be addressed:  Denies Needs/Concerns at this time Readmission within the last 30 days:  Yes Current discharge risk:  Dependent with Mobility Barriers to Discharge:  Continued Medical Work up   [employer], [place] 11/07/2018, 10:29 AM

## 2018-11-07 NOTE — Progress Notes (Signed)
Physical Therapy Treatment Patient Details Name: [name] [name] MRN: [id number] DOB: 21-Sep-1941 Today's Date: 11/07/2018    History of Present Illness This is a 78 year old male with a history of Afib on coumain, COPD s/p LLL pneumonectomy, OSA, DM, HTN, HLD, GERD, gout, and memory difficulties with recent surgical fixation of the left femoral neck at [facility] who was extubated in the OR but required re-intubation for hypoxia and increased WOB. Transferred to [facility] for further management.     PT Comments    Continuing work on functional mobility and activity tolerance;  Used bed in egress-chair position to work on sitting tolerance/balance and endurance; noting better sitting posture today than last session, much more upright initially; with incr time up, he did sink back into upper/lower trunk flexion, and neck flexion with fatigue; Made 2 unsuccessful attempts at sit to stand with +2 Max assist; [name] [name] was oriented to self and place, occasionally called out for his wife [name] [name] who was not present; Will continue to follow -- would like to teach [name] [name] how to assist with therex soon.   Follow Up Recommendations  SNF     Equipment Recommendations  Rolling walker with 5" wheels;3in1 (PT)    Recommendations for Other Services       Precautions / Restrictions Precautions Precautions: Fall;Posterior Hip Precaution Booklet Issued: Yes (comment) Precaution Comments: Reviewed Post Hip Prec Required Braces or Orthoses: Other Brace Other Brace: LE foam wedge Restrictions LLE Weight Bearing: Weight bearing as tolerated    Mobility  Bed Mobility Overal bed mobility: Needs Assistance             General bed mobility comments: Scoot to edge of bed (in chair-egress position) with bed pad, and Pt holding to bed rails; max assist of 2  Transfers Overall transfer level: Needs assistance Equipment used: 2 person hand held assist Transfers: Sit to/from Stand Sit to Stand: Max assist;+2  physical assistance         General transfer comment: 2 attempts to stand with bed pad to cradle hips and strong knee block LLE; watched RLE for hip prec; Unsuccessful attempts, though, with limited pt effort; Placed Maxi-sky lift pad under pt while he was sitting up; Assisted pt back to supine in bed, with sling under him in prep for using the Maxi-sky for getting up  Ambulation/Gait                 Stairs             Wheelchair Mobility    Modified Rankin (Stroke Patients Only)       Balance Overall balance assessment: Needs assistance Sitting-balance support: Feet supported;Bilateral upper extremity supported Sitting balance-Leahy Scale: Poor Sitting balance - Comments: Able to pull from supported sitting to unsupported sitting with mod assist, both UEs pulling on rails (bed in chair-egress position)                                    Cognition Arousal/Alertness: Awake/alert Behavior During Therapy: Flat affect Overall Cognitive Status: Impaired/Different from baseline Area of Impairment: Memory;Following commands;Problem solving                     Memory: Decreased short-term memory;Decreased recall of precautions Following Commands: Follows one step commands with increased time     Problem Solving: Slow processing;Decreased initiation;Difficulty sequencing;Requires verbal cues;Requires tactile cues General Comments: pt with baseline  memory deficits      Exercises     General Comments General comments (skin integrity, edema, etc.): VSS throughout on supplemental O2 cia [place]      Pertinent Vitals/Pain Pain Assessment: Faces Faces Pain Scale: Hurts little more Pain Location: L hip with movement, and attemtps at standing Pain Descriptors / Indicators: Grimacing;Guarding;Moaning Pain Intervention(s): Monitored during session    Home Living                      Prior Function            PT Goals (current goals  can now be found in the care plan section) Acute Rehab PT Goals Patient Stated Goal: to regain strength PT Goal Formulation: With patient/family Time For Goal Achievement: 11/20/18 Potential to Achieve Goals: Fair Progress towards PT goals: Progressing toward goals(slowly)    Frequency    Min 2X/week      PT Plan Current plan remains appropriate    Co-evaluation              AM-PAC PT "6 Clicks" Mobility   Outcome Measure  Help needed turning from your back to your side while in a flat bed without using bedrails?: Total Help needed moving from lying on your back to sitting on the side of a flat bed without using bedrails?: Total Help needed moving to and from a bed to a chair (including a wheelchair)?: Total Help needed standing up from a chair using your arms (e.g., wheelchair or bedside chair)?: Total Help needed to walk in hospital room?: Total Help needed climbing 3-5 steps with a railing? : Total 6 Click Score: 6    End of Session Equipment Utilized During Treatment: Gait belt;Oxygen Activity Tolerance: Patient tolerated treatment well Patient left: in bed;with call bell/phone within reach;with bed alarm set Nurse Communication: Mobility status;Need for lift equipment PT Visit Diagnosis: Unsteadiness on feet (R26.81);Other abnormalities of gait and mobility (R26.89);Muscle weakness (generalized) (M62.81);History of falling (Z91.81);Pain;Other (comment)(decr functional capacity) Pain - Right/Left: Left Pain - part of body: Hip     Time: 8250-0370 PT Time Calculation (min) (ACUTE ONLY): 37 min  Charges:  $Therapeutic Exercise: 8-22 mins $Therapeutic Activity: 23-37 mins                     Roney Marion, PT  Acute Rehabilitation Services Pager [phone] Office [place] 11/07/2018, 4:03 PM

## 2018-11-07 NOTE — Progress Notes (Addendum)
[facility] PROGRESS NOTE    Progress Note  [name] [name]  CZY:[id number] DOB: July 21, 1941 DOA: 11/02/2018 PCP: Monico Blitz, MD     Brief Narrative:   [name] [name] is an 78 y.o. male with a history of Afib on coumain, COPD s/p LLL pneumonectomy, OSA, DM, HTN, HLD, GERD, gout, and memory difficulties with recent surgical fixation of the left femoral neck at [facility] who was extubated in the OR but required re-intubation for hypoxia and increased WOB  Procedure: CT head 1/12 > negative  CXR  1/16 > Opacification of the left hemithorax  Cultures: Blood cultures 1/16 >  NGTD Sputum cultures 1/16 > Reincubated for better growth, abundant gram positive cocci and gram negative rods  Antimicrobials:  Zosyn 1/16 >  11/05/18 Rocephin 1/16 >  Flagyl 1/16 > 1/17    Assessment/Plan:   Acute respiratory failure with hypoxia and hypercapnia ([place]) in the setting of   COPD (chronic obstructive pulmonary disease) / Bronchiectasis ([place]): Admitted to PCCM, now transfer to [facility] on 1.21.2020. Cont Rocephin to complete a 7 day course of antibiotics. Duonebs, IC spirometry. PT eval pending, OT eval rec SNF. Speech eval deemed high risk rec NPO for now. Transfer to progressive. Will try to keep him on the dry side still requiring 5L of oxygen. Strict I and o's.  History of total hip replacement, left: At [place]. Ortho following, awaiting recommendations. PT eval pending.  AKI on CKD III: On admission 2.5, pre-renal azotemia. Baseline less than 1.  Thrombocytopenia: No active sign of bleeding. plt's stable.  Chronic Atrial fibrillation (HCC) Rate cotrol, cont diltiazem. On heparin until INR therapeutic. Coumadin per pharmacy. No longer on Xarelto due to financial reasons.  Hypomagnasemia: repleted IV recheck in am.     DM: Cont long acting insulin plus ssi  Nutrition: Place cork tack, consult nutrition. Start feeding. Malnutrition Type:  Nutrition Problem:  Inadequate oral intake Etiology: inability to eat   Malnutrition Characteristics:  Signs/Symptoms: NPO status   Nutrition Interventions:  Interventions: Refer to RD note for recommendations    DVT prophylaxis: heparin Family Communication:wife SNF in 2-3 days Disposition Plan/Barrier to D/C: transfer to [facility]. Code Status:     Code Status Orders  (From admission, onward)         Start     Ordered   11/02/18 1916  Full code  Continuous     11/02/18 1915        Code Status History    This patient has a current code status but no historical code status.        IV Access:    Peripheral IV   Procedures and diagnostic studies:   Dg Swallowing Func-speech Pathology  Result Date: 11/06/2018 Objective Swallowing Evaluation: Type of Study: MBS-Modified Barium Swallow Study  Patient Details Name: [name] [name] MRN: [id number] Date of Birth: 1941/01/29 Today's Date: 11/06/2018 Time: SLP Start Time (ACUTE ONLY): 1435 -SLP Stop Time (ACUTE ONLY): 1457 SLP Time Calculation (min) (ACUTE ONLY): 22 min Past Medical History: Past Medical History: Diagnosis Date . Arthritis  . Bronchiectasis   L lung lobe removed . Chronic bronchitis ([place])  . COPD (chronic obstructive pulmonary disease) ([place])  . Essential hypertension, benign  . GERD (gastroesophageal reflux disease)  . History of gout  . Hypercholesteremia  . Impaired vision   Wears contacts or glasses . Left atrial enlargement   LA size 14mm by echo . Memory difficulties   Forgetfullness, taking Aricept preventatively, father had Alzheimers .  Nephrolithiasis  . Obesity  . OSA (obstructive sleep apnea)  . Persistent atrial fibrillation  . Recurrent upper respiratory infection (URI)  . Type 2 diabetes mellitus ([place])  Past Surgical History: Past Surgical History: Procedure Laterality Date . CARDIAC CATHETERIZATION  6/09  Normal coronaries . CARDIOVERSION  08/2012  successfully restored to NSR . CARDIOVERSION  08/26/2012  Procedure:  CARDIOVERSION;  Surgeon: Thompson Grayer, MD;  Location: [facility] OR;  Service: Cardiovascular;  Laterality: N/A; . INCISE AND DRAIN ABCESS  2006  Posterior neck . [place] SURGERY  2010 . LUNG LOBECTOMY  1959  Left . REPLACEMENT DISC ANTERIOR LUMBAR SPINE  2011  "put rods in" (08/23/2012) . TONSILLECTOMY  ~ 1946 . TOTAL HIP ARTHROPLASTY  08/23/2011  Procedure: TOTAL HIP ARTHROPLASTY;  Surgeon: Rudean Haskell, MD;  Location: [place];  Service: Orthopedics;  Laterality: Right; . URETEROLITHOTOMY    stone removed from ureter HPI: This is a 78 year old male with a history of Afib on coumain, COPD s/p LLL pneumonectomy, OSA, DM, HTN, HLD, GERD, gout, and memory difficulties with recent surgical fixation of the left femoral neck at [facility] who was extubated in the OR but required re-intubation for hypoxia and increased WOB. Transferred to [facility] for further management. Pt intubated for a 6 days. With extubation and reintubation the day of surgery. CXR 1/18: "Extensive bilateral airspace disease left greater than right" Head CT 1/17: no acute findings  Subjective: Pt awake, alert, participative Assessment / Plan / Recommendation CHL IP CLINICAL IMPRESSIONS 11/06/2018 Clinical Impression Pt presents with mild oropharyngeal dysphagia c/b delayed swallow initiation, decreased base of tongue retraction, reduced epiglottic inversion, incomplete laryngeal closure, reduced hyolaryngeal excursion, and diminished sensation.  These deficits resulted in mild penetration of thin liquid before the swallow, with suspected unvisualized aspiration after the swallow, and trace static penetration of nectar and honey thick liquids.  Cued cough did not clear penetration.  There was severe vallecula stasis of puree and solid textures with very little passage of bolus into esophagus.  Residuals were very difficult to clear even with mulitple swallows and liquid wash.  If pt continues to follow directions and remains alert, he may be able to  participate in exercise program to improve pharyngeal swallow function.  Recommend pt remain NPO at present with alternate means of nutrition, hydration, and medication.  Allow ice chips for pt comfort after good oral care, in moderation, when pt is fully awake/alert, with supervision and upright positioning. SLP Visit Diagnosis Dysphagia, oropharyngeal phase (R13.12) Attention and concentration deficit following -- Frontal lobe and executive function deficit following -- Impact on safety and function Moderate aspiration risk   CHL IP TREATMENT RECOMMENDATION 11/06/2018 Treatment Recommendations F/U MBS in --- days (Comment);Therapy as outlined in treatment plan below   Prognosis 11/06/2018 Prognosis for Safe Diet Advancement Fair Barriers to Reach Goals Cognitive deficits Barriers/Prognosis Comment -- CHL IP DIET RECOMMENDATION 11/06/2018 SLP Diet Recommendations NPO;Alternative means - temporary;Ice chips PRN after oral care Liquid Administration via -- Medication Administration Via alternative means Compensations -- Postural Changes --   CHL IP OTHER RECOMMENDATIONS 11/06/2018 Recommended Consults -- Oral Care Recommendations Oral care QID Other Recommendations --   No flowsheet data found.  No flowsheet data found.     CHL IP ORAL PHASE 11/06/2018 Oral Phase WFL Oral - Pudding Teaspoon -- Oral - Pudding Cup -- Oral - Honey Teaspoon -- Oral - Honey Cup -- Oral - Nectar Teaspoon -- Oral - Nectar Cup -- Oral - Nectar Straw --  Oral - Thin Teaspoon -- Oral - Thin Cup -- Oral - Thin Straw -- Oral - Puree -- Oral - Mech Soft -- Oral - Regular -- Oral - Multi-Consistency -- Oral - Pill -- Oral Phase - Comment --  CHL IP PHARYNGEAL PHASE 11/06/2018 Pharyngeal Phase Impaired Pharyngeal- Pudding Teaspoon -- Pharyngeal -- Pharyngeal- Pudding Cup -- Pharyngeal -- Pharyngeal- Honey Teaspoon -- Pharyngeal -- Pharyngeal- Honey Cup Delayed swallow initiation-vallecula;Reduced airway/laryngeal closure;Reduced tongue base  retraction;Penetration/Aspiration during swallow;Trace aspiration Pharyngeal Material enters airway, remains ABOVE vocal cords and not ejected out Pharyngeal- Nectar Teaspoon -- Pharyngeal -- Pharyngeal- Nectar Cup Delayed swallow initiation-vallecula;Reduced airway/laryngeal closure;Penetration/Aspiration during swallow;Reduced tongue base retraction;Trace aspiration Pharyngeal Material enters airway, remains ABOVE vocal cords and not ejected out Pharyngeal- Nectar Straw -- Pharyngeal -- Pharyngeal- Thin Teaspoon -- Pharyngeal -- Pharyngeal- Thin Cup Delayed swallow initiation-vallecula;Reduced airway/laryngeal closure;Reduced tongue base retraction;Penetration/Aspiration before swallow;Penetration/Aspiration during swallow Pharyngeal Material enters airway, remains ABOVE vocal cords and not ejected out Pharyngeal- Thin Straw -- Pharyngeal -- Pharyngeal- Puree Delayed swallow initiation-vallecula;Reduced epiglottic inversion;Reduced tongue base retraction;Pharyngeal residue - valleculae Pharyngeal Material does not enter airway Pharyngeal- Mechanical Soft -- Pharyngeal -- Pharyngeal- Regular Delayed swallow initiation-vallecula;Reduced epiglottic inversion;Reduced tongue base retraction;Pharyngeal residue - valleculae Pharyngeal Material does not enter airway Pharyngeal- Multi-consistency -- Pharyngeal -- Pharyngeal- Pill -- Pharyngeal -- Pharyngeal Comment --  CHL IP CERVICAL ESOPHAGEAL PHASE 11/06/2018 Cervical Esophageal Phase WFL Pudding Teaspoon -- Pudding Cup -- Honey Teaspoon -- Honey Cup -- Nectar Teaspoon -- Nectar Cup -- Nectar Straw -- Thin Teaspoon -- Thin Cup -- Thin Straw -- Puree -- Mechanical Soft -- Regular -- Multi-consistency -- Pill -- Cervical Esophageal Comment -- Leigh E Borum 11/06/2018, 3:26 PM                Medical Consultants:    None.  Anti-Infectives:   IV rocephin  Subjective:    FREDRIK MOGEL able to answer question no complains.  Objective:    Vitals:   11/07/18  0400 11/07/18 0500 11/07/18 0600 11/07/18 0700  BP: 130/74 131/76 (!) 113/97 118/70  Pulse:   90 87  Resp: (!) 31 (!) 30 (!) 31 (!) 25  Temp:      TempSrc:      SpO2:   95% 90%  Weight:  117.7 kg    Height:        Intake/Output Summary (Last 24 hours) at 11/07/2018 0743 Last data filed at 11/07/2018 0600 Gross per 24 hour  Intake 606.92 ml  Output 4380 ml  Net -3773.08 ml   Filed Weights   11/05/18 0500 11/06/18 0445 11/07/18 0500  Weight: 127.5 kg 126.8 kg 117.7 kg    Exam: General exam: In no acute distress. Respiratory system: Good air movement and clear to auscultation. Cardiovascular system: S1 & S2 heard, RRR. No JVD. Gastrointestinal system: Abdomen is nondistended, soft and nontender.  Central nervous system: Alert and oriented x2. No focal neurological deficits. Extremities: No pedal edema. Skin: No rashes, lesions or ulcers   Data Reviewed:    Labs: Basic Metabolic Panel: Recent Labs  Lab 11/02/18 2148 11/03/18 0540 11/04/18 0401 11/04/18 1618 11/05/18 0107 11/06/18 0248 11/07/18 0224  NA 138 137 140 141 141 145 144  K 5.1 4.5 3.9 4.1 3.5 3.6 3.6  CL 106 108 110 108 107 106 105  CO2 18* 17* 19* 21* 23 27 28   GLUCOSE 270* 241* 263* 291* 263* 150* 109*  BUN 42* 48* 50* 45* 43* 33* 27*  CREATININE 2.51*  2.36* 1.44* 1.22 1.12 0.96 0.93  CALCIUM 7.9* 7.6* 7.8* 8.2* 8.2* 8.6* 9.0  MG 1.8 1.7  --   --   --  1.7  --   PHOS 4.8* 4.6  --   --   --   --   --    GFR Estimated Creatinine Clearance: 93.5 mL/min (by C-G formula based on SCr of 0.93 mg/dL). Liver Function Tests: Recent Labs  Lab 11/02/18 2148 11/04/18 0401  AST 143* 94*  ALT 85* 127*  ALKPHOS 55 59  BILITOT 1.4* 0.7  PROT 5.4* 5.1*  ALBUMIN 2.4* 1.9*   No results for input(s): LIPASE, AMYLASE in the last 168 hours. No results for input(s): AMMONIA in the last 168 hours. Coagulation profile Recent Labs  Lab 11/02/18 2034 11/06/18 0248 11/07/18 0224  INR 1.52 1.30 1.32     CBC: Recent Labs  Lab 11/02/18 2034 11/03/18 0540 11/05/18 0107 11/06/18 0248 11/07/18 0224  WBC 21.6* 17.2* 17.7* 13.9* 13.4*  HGB 8.4* 10.9* 10.3* 10.7* 11.2*  HCT 26.4* 35.6* 32.7* 34.4* 36.6*  MCV 91.3 88.6 86.5 87.1 87.8  PLT 81* 105* 122* 160 187   Cardiac Enzymes: No results for input(s): CKTOTAL, CKMB, CKMBINDEX, TROPONINI in the last 168 hours. BNP (last 3 results) No results for input(s): PROBNP in the last 8760 hours. CBG: Recent Labs  Lab 11/06/18 1130 11/06/18 1531 11/06/18 1941 11/06/18 2320 11/07/18 0534  GLUCAP 164* 148* 140* 132* 125*   D-Dimer: No results for input(s): DDIMER in the last 72 hours. Hgb A1c: No results for input(s): HGBA1C in the last 72 hours. Lipid Profile: No results for input(s): CHOL, HDL, LDLCALC, TRIG, CHOLHDL, LDLDIRECT in the last 72 hours. Thyroid function studies: No results for input(s): TSH, T4TOTAL, T3FREE, THYROIDAB in the last 72 hours.  Invalid input(s): FREET3 Anemia work up: No results for input(s): VITAMINB12, FOLATE, FERRITIN, TIBC, IRON, RETICCTPCT in the last 72 hours. Sepsis Labs: Recent Labs  Lab 11/03/18 0540 11/05/18 0107 11/06/18 0248 11/07/18 0224  WBC 17.2* 17.7* 13.9* 13.4*   Microbiology Recent Results (from the past 240 hour(s))  MRSA PCR Screening     Status: None   Collection Time: 11/02/18  6:53 PM  Result Value Ref Range Status   MRSA by PCR NEGATIVE NEGATIVE Final    Comment:        The GeneXpert MRSA Assay (FDA approved for NASAL specimens only), is one component of a comprehensive MRSA colonization surveillance program. It is not intended to diagnose MRSA infection nor to guide or monitor treatment for MRSA infections.   Culture, blood (routine x 2)     Status: None (Preliminary result)   Collection Time: 11/02/18  8:12 PM  Result Value Ref Range Status   Specimen Description BLOOD LEFT HAND  Final   Special Requests   Final    BOTTLES DRAWN AEROBIC ONLY Blood Culture  results may not be optimal due to an inadequate volume of blood received in culture bottles   Culture   Final    NO GROWTH 4 DAYS Performed at [facility], [place] [address], [place], [place] [address]    Report Status PENDING  Incomplete  Culture, blood (routine x 2)     Status: None (Preliminary result)   Collection Time: 11/02/18  8:40 PM  Result Value Ref Range Status   Specimen Description BLOOD RIGHT ARM  Final   Special Requests   Final    BOTTLES DRAWN AEROBIC AND ANAEROBIC Blood Culture adequate  volume   Culture   Final    NO GROWTH 4 DAYS Performed at [facility], [place] [address], [place], [place] [address]    Report Status PENDING  Incomplete  Culture, respiratory (tracheal aspirate)     Status: None   Collection Time: 11/02/18 11:08 PM  Result Value Ref Range Status   Specimen Description TRACHEAL ASPIRATE  Final   Special Requests NONE  Final   Gram Stain   Final    ABUNDANT WBC PRESENT, PREDOMINANTLY PMN ABUNDANT GRAM POSITIVE COCCI ABUNDANT GRAM NEGATIVE RODS    Culture   Final    Consistent with normal respiratory flora. Performed at [facility], [place] [address], [place], [place] [address]    Report Status 11/05/2018 FINAL  Final     Medications:   . arformoterol  15 mcg Nebulization BID  . budesonide (PULMICORT) nebulizer solution  0.25 mg Nebulization BID  . chlorhexidine  15 mL Mouth Rinse BID  . diltiazem  45 mg Per Tube Q6H  . donepezil  5 mg Oral QHS  . finasteride  5 mg Oral Daily  . insulin aspart  0-15 Units Subcutaneous Q4H  . insulin glargine  7 Units Subcutaneous Daily  . ipratropium-albuterol  3 mL Nebulization Q6H  . mouth rinse  15 mL Mouth Rinse q12n4p  . simvastatin  40 mg Per Tube q1800  . warfarin  7.5 mg Per Tube ONCE-1800  . Warfarin - Pharmacist Dosing Inpatient   Does not apply q1800   Continuous Infusions: . cefTRIAXone (ROCEPHIN)  IV 2 g (11/06/18 2100)  . heparin 2,100 Units/hr (11/07/18 0500)      LOS:  5 days   Charlynne Cousins  [facility]   *Please refer to [email or web address], password [email or web address] to get updated schedule on who will round on this patient, as hospitalists switch teams weekly. If 7PM-7AM, please contact night-coverage at [email or web address], password [email or web address] for any overnight needs.  11/07/2018, 7:43 AM

## 2018-11-07 NOTE — NC FL2 (Signed)
[place] LEVEL OF CARE SCREENING TOOL     IDENTIFICATION  Patient Name: [name] [name] Birthdate: November 04, 1940 Sex: male Admission Date (Current Location): 11/02/2018  [facility] and [place] Number:  Herbalist and Address:  [facility], [place] [address] [place], [place] [address]      Provider Number: [id number]  Attending Physician Name and Address:  Charlynne Cousins, MD  Relative Name and Phone Number:       Current Level of Care: Hospital Recommended Level of Care: [place] Prior Approval Number:    Date Approved/Denied:   PASRR Number:   [id number]   Discharge Plan: SNF    Current Diagnoses: Patient Active Problem List   Diagnosis Date Noted  . Bronchiectasis ([place]) 11/07/2018  . History of total hip replacement, left 11/07/2018  . AKI (acute kidney injury) ([place]) 11/07/2018  . CKD (chronic kidney disease), stage III ([place]) 11/07/2018  . Thrombocytopenia ([place]) 11/07/2018  . Hypomagnesemia 11/07/2018  . BPH (benign prostatic hyperplasia) 11/07/2018  . Acute respiratory failure with hypoxia and hypercapnia ([place]) 11/02/2018  . COPD (chronic obstructive pulmonary disease) ([place]) 08/27/2012  . Snoring 07/05/2012  . Hypertension 05/31/2012  . Diabetes mellitus ([place]) 05/31/2012  . Mixed hyperlipidemia 08/20/2010  . Atrial fibrillation ([place]) 07/30/2009    Orientation RESPIRATION BLADDER Height & Weight     Self, Time, Place  Normal Incontinent Weight: 259 lb 7.7 oz (117.7 kg) Height:  6\' 3"  (190.5 cm)  BEHAVIORAL SYMPTOMS/MOOD NEUROLOGICAL BOWEL NUTRITION STATUS      Incontinent Diet(please see discharge summary. )  AMBULATORY STATUS COMMUNICATION OF NEEDS Skin   Extensive Assist Verbally Other (Comment)(wound on hip per chart. )                       Personal Care Assistance Level of Assistance  Bathing, Feeding, Dressing Bathing Assistance: Maximum assistance Feeding assistance: Limited  assistance Dressing Assistance: Maximum assistance     Functional Limitations Info  Sight, Hearing, Speech Sight: Impaired Hearing Info: Adequate Speech Info: Adequate    SPECIAL CARE FACTORS FREQUENCY  PT (By licensed PT), OT (By licensed OT)     PT Frequency: 5 times a week  OT Frequency: 5 times a week             Contractures Contractures Info: Not present    Additional Factors Info  Code Status, Allergies Code Status Info: Full  Allergies Info: Contrast Media Iodinated Diagnostic Agents, Penicillins, Quinidine, Quinine Derivatives, Sulfa Antibiotics           Current Medications (11/07/2018):  This is the current hospital active medication list Current Facility-Administered Medications  Medication Dose Route Frequency Provider Last Rate Last Dose  . 0.9 %  sodium chloride infusion   Intravenous Continuous Charlynne Cousins, MD 25 mL/hr at 11/07/18 0818 25 mL/hr at 11/07/18 0818  . acetaminophen (TYLENOL) tablet 650 mg  650 mg Oral Q6H PRN Asencion Noble, MD      . albuterol (PROVENTIL) (2.5 MG/3ML) 0.083% nebulizer solution 2.5 mg  2.5 mg Nebulization Q3H PRN Asencion Noble, MD      . arformoterol [facility]) nebulizer solution 15 mcg  15 mcg Nebulization BID Asencion Noble, MD   15 mcg at 11/07/18 0850  . budesonide (PULMICORT) nebulizer solution 0.25 mg  0.25 mg Nebulization BID Lonia Skinner M, MD   0.25 mg at 11/07/18 0850  . cefTRIAXone (ROCEPHIN) 2 g in sodium chloride  0.9 % 100 mL IVPB  2 g Intravenous Q24H Lonia Skinner M, MD 200 mL/hr at 11/06/18 2100 2 g at 11/06/18 2100  . chlorhexidine (PERIDEX) 0.12 % solution 15 mL  15 mL Mouth Rinse BID Lonia Skinner M, MD      . diltiazem (CARDIZEM) 10 mg/ml oral suspension 45 mg  45 mg Per Tube Q6H Asencion Noble, MD   45 mg at 11/05/18 1200  . donepezil (ARICEPT) tablet 5 mg  5 mg Oral QHS Asencion Noble, MD   Stopped at 11/05/18 2132  . fentaNYL (SUBLIMAZE) injection 50 mcg  50 mcg  Intravenous Q2H PRN Asencion Noble, MD   50 mcg at 11/05/18 0414  . finasteride (PROSCAR) tablet 5 mg  5 mg Oral Daily Sherry Ruffing, Marissa M, MD   5 mg at 11/05/18 0925  . heparin ADULT infusion 100 units/mL (25000 units/270mL sodium chloride 0.45%)  2,100 Units/hr Intravenous Continuous Asencion Noble, MD 21 mL/hr at 11/07/18 0500 2,100 Units/hr at 11/07/18 0500  . insulin aspart (novoLOG) injection 0-15 Units  0-15 Units Subcutaneous Q4H Asencion Noble, MD   2 Units at 11/07/18 [phone]  . insulin glargine (LANTUS) injection 7 Units  7 Units Subcutaneous Daily Asencion Noble, MD   7 Units at 11/06/18 [phone]  . ipratropium-albuterol (DUONEB) 0.5-2.5 (3) MG/3ML nebulizer solution 3 mL  3 mL Nebulization Q6H Asencion Noble, MD   3 mL at 11/07/18 0850  . MEDLINE mouth rinse  15 mL Mouth Rinse q12n4p Asencion Noble, MD      . simvastatin (ZOCOR) tablet 40 mg  40 mg Per Tube q1800 Asencion Noble, MD   40 mg at 11/04/18 1743  . warfarin (COUMADIN) tablet 7.5 mg  7.5 mg Per Tube ONCE-1800 Asencion Noble, MD      . Warfarin - Pharmacist Dosing Inpatient   Does not apply q1800 Asencion Noble, MD         Discharge Medications: Please see discharge summary for a list of discharge medications.  Relevant Imaging Results:  Relevant Lab Results:   Additional Information [id number]. Per chart pt also  has some memory impairment.   Wetzel Bjornstad, LCSWA

## 2018-11-07 NOTE — Progress Notes (Signed)
ANTICOAGULATION CONSULT NOTE - Follow Up Consult  Pharmacy Consult for Heparin Indication: atrial fibrillation  Allergies  Allergen Reactions  . Contrast Media [Iodinated Diagnostic Agents] Rash  . Penicillins Rash    Did it involve swelling of the face/tongue/throat, SOB, or low BP? Unknown Did it involve sudden or severe rash/hives, skin peeling, or any reaction on the inside of your mouth or nose? Unknown Did you need to seek medical attention at a hospital or doctor's office? Unknown When did it last happen? If all above answers are "NO", may proceed with cephalosporin use.   . Quinidine Rash  . Quinine Derivatives Rash    quinidine  . Sulfa Antibiotics Rash    Patient Measurements: Height: 6\' 3"  (190.5 cm) Weight: 259 lb 7.7 oz (117.7 kg) IBW/kg (Calculated) : 84.5 Heparin Dosing Weight: 112 kg  Vital Signs: Temp: 97.8 F (36.6 C) (01/21 0759) Temp Source: Oral (01/21 0759) BP: 118/70 (01/21 0700) Pulse Rate: 87 (01/21 0700)  Labs: Recent Labs    11/05/18 0107 11/05/18 0926 11/06/18 0248 11/07/18 0224  HGB 10.3*  --  10.7* 11.2*  HCT 32.7*  --  34.4* 36.6*  PLT 122*  --  160 187  LABPROT  --   --  16.0* 16.3*  INR  --   --  1.30 1.32  HEPARINUNFRC 0.33 0.36 0.31 0.41  CREATININE 1.12  --  0.96 0.93    Estimated Creatinine Clearance: 93.5 mL/min (by C-G formula based on SCr of 0.93 mg/dL).   Medications:  Scheduled:  . arformoterol  15 mcg Nebulization BID  . budesonide (PULMICORT) nebulizer solution  0.25 mg Nebulization BID  . chlorhexidine  15 mL Mouth Rinse BID  . diltiazem  45 mg Per Tube Q6H  . donepezil  5 mg Oral QHS  . finasteride  5 mg Oral Daily  . insulin aspart  0-15 Units Subcutaneous Q4H  . insulin glargine  7 Units Subcutaneous Daily  . ipratropium-albuterol  3 mL Nebulization Q6H  . mouth rinse  15 mL Mouth Rinse q12n4p  . simvastatin  40 mg Per Tube q1800  . warfarin  7.5 mg Per Tube ONCE-1800  . Warfarin - Pharmacist  Dosing Inpatient   Does not apply q1800    Assessment: 35 yoM [facility] and underwent fixation for hip fracture on 10/31/18, required re-intubation in the PACU and was transferred to [facility] for further management. PMH s/f afib on warfarin PTA.   Pharmacy consulted to continue dosing warfarin with IV heparin bridge. Patient has been unable to receive warfarin so far due to NPO status. Per MD, planning to place NGT today. Heparin level remains therapeutic. INR subtherapeutic. CBC improved. No bleeding noted.  PTA warfarin dose - 5mg  daily exept 7.5mg  on Mon/Fri  Goal of Therapy:  Heparin level 0.3-0.7 units/ml  INR 2-3 Monitor platelets by anticoagulation protocol: Yes   Plan:  Continue heparin at 2100 units/hr while subtherapeutic Warfarin 7.5mg  per tube x 1 tonight Monitor daily heparin level/INR/CBC, s/sx bleeding  Elicia Lamp, PharmD, BCPS Please check AMION for all [place] contact numbers Clinical Pharmacist 11/07/2018 10:25 AM

## 2018-11-07 NOTE — Progress Notes (Signed)
Nutrition Follow-up / Consult  DOCUMENTATION CODES:   Obesity unspecified  INTERVENTION:    Cortrak tube to be placed Wednesday.  If small bore feeding tube needs to be placed today, will need to send patient to IR/Fluoroscopy.   Alternately, RN could place NGT today for enteral access.   After feeding tube placed, begin TF:  Jevity 1.2 at 60 ml/h (1440 ml per day)  Pro-stat 30 ml TID  Provides 2028 kcal, 125 gm protein, 1166 ml free water daily  NUTRITION DIAGNOSIS:   Inadequate oral intake related to inability to eat as evidenced by NPO status.  Ongoing   GOAL:   Patient will meet greater than or equal to 90% of their needs  Being addressed with Cortrak placement and initiation of TF  MONITOR:   TF tolerance, Labs, I & O's  REASON FOR ASSESSMENT:   Consult Enteral/tube feeding initiation and management  ASSESSMENT:   78 y.o. male who was admitted to [facility] on 1/12 with left hip fx, underwent surgical fixation 1/14.  Had probable aspiration and required re-intubation shortly following extubation post op. Transferred to [facility] 1/16 for further eval and management.  S/P swallow evaluation with SLP 1/20, moderate aspiration risk. Recommended remain NPO.  Received MD Consult for TF initiation and management. Cortrak placement has been ordered. Cortrak available M-W-F-Sat, so should be placed tomorrow.  Labs reviewed. CBG's: 125-137 Medications reviewed and include novolog, lantus, coumadin.   Diet Order:   Diet Order            Diet NPO time specified Except for: Ice Chips  Diet effective now              EDUCATION NEEDS:   No education needs have been identified at this time  Skin:  Skin Assessment: Skin Integrity Issues:(MASD to groin) Skin Integrity Issues:: Incisions Incisions: L hip (11/02/18)  Last BM:  PTA/unknown  Height:   Ht Readings from Last 1 Encounters:  11/02/18 6\' 3"  (1.905 m)    Weight:   Wt Readings from Last 1 Encounters:   11/07/18 117.7 kg    Ideal Body Weight:  89.09 kg  BMI:  Body mass index is 32.43 kg/m.  Estimated Nutritional Needs:   Kcal:  2000-2200  Protein:  115-130 gm  Fluid:  2-2.2 L    Molli Barrows, RD, LDN, [place] Pager [phone] After Hours Pager [phone]

## 2018-11-08 DIAGNOSIS — J449 Chronic obstructive pulmonary disease, unspecified: Secondary | ICD-10-CM

## 2018-11-08 DIAGNOSIS — J9602 Acute respiratory failure with hypercapnia: Secondary | ICD-10-CM

## 2018-11-08 DIAGNOSIS — I4821 Permanent atrial fibrillation: Secondary | ICD-10-CM

## 2018-11-08 DIAGNOSIS — R1310 Dysphagia, unspecified: Secondary | ICD-10-CM

## 2018-11-08 DIAGNOSIS — J9601 Acute respiratory failure with hypoxia: Principal | ICD-10-CM

## 2018-11-08 DIAGNOSIS — J690 Pneumonitis due to inhalation of food and vomit: Secondary | ICD-10-CM

## 2018-11-08 DIAGNOSIS — N179 Acute kidney failure, unspecified: Secondary | ICD-10-CM

## 2018-11-08 LAB — GLUCOSE, CAPILLARY
Glucose-Capillary: 113 mg/dL — ABNORMAL HIGH (ref 70–99)
Glucose-Capillary: 129 mg/dL — ABNORMAL HIGH (ref 70–99)
Glucose-Capillary: 135 mg/dL — ABNORMAL HIGH (ref 70–99)
Glucose-Capillary: 147 mg/dL — ABNORMAL HIGH (ref 70–99)
Glucose-Capillary: 147 mg/dL — ABNORMAL HIGH (ref 70–99)
Glucose-Capillary: 166 mg/dL — ABNORMAL HIGH (ref 70–99)
Glucose-Capillary: 205 mg/dL — ABNORMAL HIGH (ref 70–99)

## 2018-11-08 LAB — CBC
HCT: 37 % — ABNORMAL LOW (ref 39.0–52.0)
Hemoglobin: 11.4 g/dL — ABNORMAL LOW (ref 13.0–17.0)
MCH: 27.2 pg (ref 26.0–34.0)
MCHC: 30.8 g/dL (ref 30.0–36.0)
MCV: 88.3 fL (ref 80.0–100.0)
PLATELETS: 236 10*3/uL (ref 150–400)
RBC: 4.19 MIL/uL — ABNORMAL LOW (ref 4.22–5.81)
RDW: 17.3 % — AB (ref 11.5–15.5)
WBC: 15.5 10*3/uL — ABNORMAL HIGH (ref 4.0–10.5)
nRBC: 0.2 % (ref 0.0–0.2)

## 2018-11-08 LAB — PROTIME-INR
INR: 1.27
Prothrombin Time: 15.7 seconds — ABNORMAL HIGH (ref 11.4–15.2)

## 2018-11-08 LAB — HEPARIN LEVEL (UNFRACTIONATED): Heparin Unfractionated: 0.4 IU/mL (ref 0.30–0.70)

## 2018-11-08 MED ORDER — POLYETHYLENE GLYCOL 3350 17 G PO PACK: 17.0000 g | PACK | Freq: Every day | ORAL | Status: DC

## 2018-11-08 MED ORDER — WARFARIN SODIUM 7.5 MG PO TABS: 7.5000 mg | ORAL_TABLET | Freq: Once | ORAL | Status: DC

## 2018-11-08 MED ORDER — WARFARIN SODIUM 7.5 MG PO TABS
7.5000 mg | ORAL_TABLET | Freq: Once | ORAL | Status: AC
  Administered 2018-11-08: 7.5 mg via ORAL
  Filled 2018-11-08: qty 1

## 2018-11-08 MED ORDER — SIMVASTATIN 20 MG PO TABS
40.0000 mg | ORAL_TABLET | Freq: Every day | ORAL | Status: DC
  Administered 2018-11-08 – 2018-11-10 (×3): 40 mg via ORAL
  Filled 2018-11-08 (×3): qty 2

## 2018-11-08 MED ORDER — RESOURCE THICKENUP CLEAR PO POWD
ORAL | Status: DC | PRN
  Administered 2018-11-09: 09:00:00 via ORAL
  Filled 2018-11-08: qty 125

## 2018-11-08 NOTE — Progress Notes (Signed)
PROGRESS NOTE  [name] [name] [id number] DOB: 1941/05/31 DOA: 11/02/2018 PCP: Monico Blitz, MD  Brief History   78 year old man PMH COPD, diabetes, atrial fibrillation presented as a transfer from [facility] postop day 2 of internal fixation left femoral neck fracture for further management of hypoxic respiratory failure.  A & P  Acute hypoxic, hypercapnic respiratory failure in the setting of COPD and bronchiectasis, admitted by pulmonary critical care and transferred to the hospital service 1/21. --Oxygenation stable on nasal cannula.  Respiratory status appears stable.  Given clinical improvement will continue current antibiotics.  Wean oxygen as tolerated.  Aspiration pneumonia --Impressive on chest x-ray but patient appears to be improving on ceftriaxone, therefore will not adjust antibiotics or add anaerobic coverage at this time.  COPD, bronchiectasis, status post left lower lobe pneumonectomy --Appears stable.  Continue bronchodilators.  Dysphagia, initially kept n.p.o. per speech therapy --Dysphagia 1 diet, nectar thick liquids, medications crushed with pure as per speech pathology recommendation 1/22  Diabetes mellitus --CBG stable.  Continue Lantus, sliding scale insulin.  A. fib with rapid ventricular response --Continue Cardizem.  Continue warfarin per pharmacy  AKI --Resolved.  Status post left hip hemiarthroplasty at [facility] --Weight-bear as tolerated per orthopedics  Per progress notes 1/17: "Spoke with Dr. Cheral Marker about the CT results with the possible communicating hydrocephalus. He reviewed the CT scan and he stated that he does not feel like the CT results show a hydrocephalus and that it appears to be mostly just atrophy. Will not consult neurology at this time"  Dementia --Continue Aricept     DVT prophylaxis: Heparin, warfarin Code Status: full Family Communication: wife at bedside Disposition Plan: SNF   Murray Hodgkins, MD  [facility] Direct contact: see [email or web address]  7PM-7AM contact night coverage as above 11/08/2018, 5:55 PM  LOS: 6 days   Consultants  . Admitted by critical care  Procedures  1/12 > presented to [facility] 1/14 > left hemiarthroplasty 1/16 > transferred to [facility].  EEG Clinical Interpretation: This EEG is consistent with a generalized nonspecific cerebral dysfunction (encephalopathy). There was no seizure or seizure predisposition recorded on this study. Please note that lack of epileptiform activity on EEG does not preclude the possibility of epilepsy.   Antibiotics  . Ceftriaxone 1/16 >  Interval History/Subjective  Feels okay, no nausea or vomiting.  No abdominal pain.  Breathing okay.  No complaints.  Objective   Vitals:  Vitals:   11/08/18 0836 11/08/18 1151  BP:  (!) 154/90  Pulse: 94 89  Resp: (!) 32 (!) 33  Temp:  97.6 F (36.4 C)  SpO2: (!) 88% 92%    Exam:  Constitutional:  . Appears calm and comfortable, weak Eyes:  . pupils and irises appear normal . Normal lids ENMT:  . grossly normal hearing  . Tongue appears unremarkable Respiratory:  . CTA bilaterally, no w/r/r.  . Respiratory effort normal.  Cardiovascular:  . RRR, no m/r/g . No LE extremity edema   Abdomen:  . Soft, nontender, nondistended Musculoskeletal:  . RLE, LLE   . Moves both feet to command Psychiatric:  . Mental status o Mood, affect appropriate   I have personally reviewed the following:   Today's Data  . Blood sugars stable . WBC stable at 15.5, hemoglobin stable 11.4, platelets within normal limits, INR 1.27.  Lab Data  .   Micro Data  . Negative respiratory and blood cultures  Imaging  . Chest x-ray noted, extensive left-sided infiltrate  Cardiology Data  .  Other Data  .   Scheduled Meds: . arformoterol  15 mcg Nebulization BID  . budesonide (PULMICORT) nebulizer solution  0.25 mg Nebulization BID  . chlorhexidine  15 mL Mouth Rinse BID  . diltiazem  45 mg  Per Tube Q6H  . donepezil  5 mg Oral QHS  . finasteride  5 mg Oral Daily  . insulin aspart  0-15 Units Subcutaneous Q4H  . insulin glargine  7 Units Subcutaneous Daily  . mouth rinse  15 mL Mouth Rinse q12n4p  . polyethylene glycol  17 g Oral Daily  . simvastatin  40 mg Oral q1800  . warfarin  7.5 mg Oral ONCE-1800  . Warfarin - Pharmacist Dosing Inpatient   Does not apply q1800   Continuous Infusions: . cefTRIAXone (ROCEPHIN)  IV 2 g (11/07/18 2312)  . heparin 2,100 Units/hr (11/08/18 1714)    Principal Problem:   Aspiration pneumonia ([place]) Active Problems:   Atrial fibrillation (HCC)   COPD (chronic obstructive pulmonary disease) (HCC)   Acute respiratory failure with hypoxia and hypercapnia (HCC)   Bronchiectasis (HCC)   History of total hip replacement, left   CKD (chronic kidney disease), stage III (HCC)   BPH (benign prostatic hyperplasia)   Dysphagia   LOS: 6 days   Time spent 40 minutes, greater than 50% coordination of care

## 2018-11-08 NOTE — Progress Notes (Signed)
ANTICOAGULATION CONSULT NOTE - Follow Up Consult  Pharmacy Consult for Heparin, coumadin Indication: atrial fibrillation  Allergies  Allergen Reactions  . Contrast Media [Iodinated Diagnostic Agents] Rash  . Penicillins Rash    Did it involve swelling of the face/tongue/throat, SOB, or low BP? Unknown Did it involve sudden or severe rash/hives, skin peeling, or any reaction on the inside of your mouth or nose? Unknown Did you need to seek medical attention at a hospital or doctor's office? Unknown When did it last happen? If all above answers are "NO", may proceed with cephalosporin use.   . Quinidine Rash  . Quinine Derivatives Rash    quinidine  . Sulfa Antibiotics Rash    Patient Measurements: Height: 6\' 3"  (190.5 cm) Weight: 262 lb 5.6 oz (119 kg) IBW/kg (Calculated) : 84.5 Heparin Dosing Weight: 112 kg  Vital Signs: Temp: 98.6 F (37 C) (01/22 0746) Temp Source: Oral (01/22 0746) BP: 117/64 (01/22 0746) Pulse Rate: 92 (01/22 0746)  Labs: Recent Labs    11/06/18 0248 11/07/18 0224 11/08/18 0543  HGB 10.7* 11.2* 11.4*  HCT 34.4* 36.6* 37.0*  PLT 160 187 236  LABPROT 16.0* 16.3* 15.7*  INR 1.30 1.32 1.27  HEPARINUNFRC 0.31 0.41 0.40  CREATININE 0.96 0.93  --     Estimated Creatinine Clearance: 94 mL/min (by C-G formula based on SCr of 0.93 mg/dL).   Medications:  Scheduled:  . arformoterol  15 mcg Nebulization BID  . budesonide (PULMICORT) nebulizer solution  0.25 mg Nebulization BID  . chlorhexidine  15 mL Mouth Rinse BID  . diltiazem  45 mg Per Tube Q6H  . donepezil  5 mg Oral QHS  . finasteride  5 mg Oral Daily  . insulin aspart  0-15 Units Subcutaneous Q4H  . insulin glargine  7 Units Subcutaneous Daily  . ipratropium-albuterol  3 mL Nebulization Q6H  . mouth rinse  15 mL Mouth Rinse q12n4p  . polyethylene glycol  17 g Oral Daily  . simvastatin  40 mg Per Tube q1800  . warfarin  7.5 mg Per Tube ONCE-1800  . Warfarin - Pharmacist Dosing  Inpatient   Does not apply q1800    Assessment: 76 yoM presented to [facility] s/p fall and underwent fixation for hip fracture on 10/31/18, required re-intubation in the PACU and was transferred to [facility] for further management. PMH s/f afib on warfarin PTA. Pharmacy consulted to continue dosing warfarin with IV heparin bridge.  PTA warfarin dose - 5mg  daily exept 7.5mg  on Mon/Fri  Warfarin 1/21 dose not given d/t no cortrak placement, INR 1.27.  Heparin level therapeutic at 0.40.  CBC stable.    Goal of Therapy:  Heparin level 0.3-0.7 units/ml  INR 2-3 Monitor platelets by anticoagulation protocol: Yes   Plan:  Continue heparin gtt at 2100 units/hr Give warfarin 7.5mg  PO x 1 tonight Daily heparin level, INR, CBC, s/s bleeding  Bertis Ruddy, PharmD Clinical Pharmacist Please check AMION for all [place] numbers 11/08/2018 7:54 AM

## 2018-11-08 NOTE — Progress Notes (Signed)
Cortrak Tube Team Note:  RD attempted to place Cortrak x4. Monitor reads as tube drifting into the right quadrant. Pt's O2 saturation dropped to 87-89 and pt asked for RD to stop. Tube was pulled. If pt continues to require a feeding tube, recommend IR placement. Cortrak to place bridle if feeding tube placed in IR.    Mariana Single RD, LDN Clinical Nutrition Pager # [phone]

## 2018-11-08 NOTE — Progress Notes (Signed)
  Speech Language Pathology Treatment: Dysphagia  Patient Details Name: [name] [name] MRN: [id number] DOB: 02-22-1941 Today's Date: 11/08/2018 Time: 4665-9935 SLP Time Calculation (min) (ACUTE ONLY): 15 min  Assessment / Plan / Recommendation Clinical Impression  Pt more alert with swift trigger with puree and small sips of water in comparison with prolonged delay observed on MBS. No immediate signs of aspiration though subjectively this does not indicate tolerance. Given improvements will initiate a dys 1/nectar thick diet and monitor closely for tolerance to avoid another feeding tube placement and allow for better access to swallowing opportunities to improve swallow function. Wife and RN in agreement with plan, explicitly requested full supervision for all PO intake.    HPI HPI: This is a 78 year old male with a history of Afib on coumain, COPD s/p LLL pneumonectomy, OSA, DM, HTN, HLD, GERD, gout, and memory difficulties with recent surgical fixation of the left femoral neck at [facility] who was extubated in the OR but required re-intubation for hypoxia and increased WOB. Transferred to [facility] for further management. Pt intubated for a 6 days. With extubation and reintubation the day of surgery. CXR 1/18: "Extensive bilateral airspace disease left greater than right" Head CT 1/17: no acute findings      SLP Plan  Continue with current plan of care       Recommendations  Diet recommendations: Dysphagia 1 (puree);Nectar-thick liquid Liquids provided via: Cup Medication Administration: Crushed with puree Supervision: Staff to assist with self feeding;Full supervision/cueing for compensatory strategies;Trained caregiver to feed patient Compensations: Slow rate;Small sips/bites;Effortful swallow Postural Changes and/or Swallow Maneuvers: Seated upright 90 degrees                Plan: Continue with current plan of care       GO               [name] Baltimore, MA [place] Pager [phone] Office [phone]  Lynann Beaver 11/08/2018, 1:19 PM

## 2018-11-08 NOTE — Care Management Important Message (Signed)
Important Message  Patient Details  Name: [name] [name] MRN: [id number] Date of Birth: [birth year]   Medicare Important Message Given:  Yes    Satish Hammers 11/08/2018, 2:10 PM

## 2018-11-09 DIAGNOSIS — R1310 Dysphagia, unspecified: Secondary | ICD-10-CM

## 2018-11-09 LAB — GLUCOSE, CAPILLARY
GLUCOSE-CAPILLARY: 132 mg/dL — AB (ref 70–99)
Glucose-Capillary: 124 mg/dL — ABNORMAL HIGH (ref 70–99)
Glucose-Capillary: 219 mg/dL — ABNORMAL HIGH (ref 70–99)
Glucose-Capillary: 254 mg/dL — ABNORMAL HIGH (ref 70–99)
Glucose-Capillary: 108 mg/dL — ABNORMAL HIGH (ref 70–99)
Glucose-Capillary: 167 mg/dL — ABNORMAL HIGH (ref 70–99)

## 2018-11-09 LAB — CBC
HCT: 36.8 % — ABNORMAL LOW (ref 39.0–52.0)
Hemoglobin: 11.4 g/dL — ABNORMAL LOW (ref 13.0–17.0)
MCH: 27.5 pg (ref 26.0–34.0)
MCHC: 31 g/dL (ref 30.0–36.0)
MCV: 88.9 fL (ref 80.0–100.0)
Platelets: 261 10*3/uL (ref 150–400)
RBC: 4.14 MIL/uL — ABNORMAL LOW (ref 4.22–5.81)
RDW: 17.5 % — ABNORMAL HIGH (ref 11.5–15.5)
WBC: 13.6 10*3/uL — ABNORMAL HIGH (ref 4.0–10.5)
nRBC: 0.1 % (ref 0.0–0.2)

## 2018-11-09 LAB — HEPARIN LEVEL (UNFRACTIONATED)
Heparin Unfractionated: 0.29 IU/mL — ABNORMAL LOW (ref 0.30–0.70)
Heparin Unfractionated: 0.56 IU/mL (ref 0.30–0.70)

## 2018-11-09 LAB — PROTIME-INR
INR: 1.25
Prothrombin Time: 15.6 seconds — ABNORMAL HIGH (ref 11.4–15.2)

## 2018-11-09 MED ORDER — WARFARIN SODIUM 7.5 MG PO TABS
7.5000 mg | ORAL_TABLET | Freq: Once | ORAL | Status: AC
  Administered 2018-11-09: 7.5 mg via ORAL
  Filled 2018-11-09: qty 1

## 2018-11-09 MED ORDER — BISACODYL 10 MG RE SUPP
10.0000 mg | Freq: Once | RECTAL | Status: AC
  Administered 2018-11-09: 10 mg via RECTAL
  Filled 2018-11-09: qty 1

## 2018-11-09 MED ORDER — SENNA 8.6 MG PO TABS
1.0000 | ORAL_TABLET | Freq: Every day | ORAL | Status: DC
  Administered 2018-11-09: 8.6 mg via ORAL
  Filled 2018-11-09: qty 1

## 2018-11-09 MED ORDER — ENOXAPARIN SODIUM 40 MG/0.4ML ~~LOC~~ SOLN
40.0000 mg | SUBCUTANEOUS | Status: DC
  Administered 2018-11-09 – 2018-11-10 (×2): 40 mg via SUBCUTANEOUS
  Filled 2018-11-09 (×2): qty 0.4

## 2018-11-09 MED ORDER — UMECLIDINIUM-VILANTEROL 62.5-25 MCG/INH IN AEPB
1.0000 | INHALATION_SPRAY | Freq: Every day | RESPIRATORY_TRACT | Status: DC
  Administered 2018-11-10: 1 via RESPIRATORY_TRACT
  Filled 2018-11-09: qty 14

## 2018-11-09 MED ORDER — DONEPEZIL HCL 10 MG PO TABS
10.0000 mg | ORAL_TABLET | Freq: Every day | ORAL | Status: DC
  Administered 2018-11-09: 10 mg via ORAL
  Filled 2018-11-09: qty 1

## 2018-11-09 MED ORDER — TAMSULOSIN HCL 0.4 MG PO CAPS
0.4000 mg | ORAL_CAPSULE | Freq: Every day | ORAL | Status: DC
  Administered 2018-11-09 – 2018-11-10 (×2): 0.4 mg via ORAL
  Filled 2018-11-09 (×2): qty 1

## 2018-11-09 MED ORDER — ALBUTEROL SULFATE (2.5 MG/3ML) 0.083% IN NEBU: 2.5000 mg | INHALATION_SOLUTION | Freq: Four times a day (QID) | RESPIRATORY_TRACT | Status: DC | PRN

## 2018-11-09 MED ORDER — POLYETHYLENE GLYCOL 3350 17 G PO PACK
17.0000 g | PACK | Freq: Two times a day (BID) | ORAL | Status: DC
  Administered 2018-11-09 – 2018-11-10 (×3): 17 g via ORAL
  Filled 2018-11-09 (×3): qty 1

## 2018-11-09 MED ORDER — IPRATROPIUM-ALBUTEROL 0.5-2.5 (3) MG/3ML IN SOLN: 3.0000 mL | Freq: Four times a day (QID) | RESPIRATORY_TRACT | Status: DC | PRN

## 2018-11-09 NOTE — Progress Notes (Addendum)
  Speech Language Pathology Treatment: Dysphagia  Patient Details Name: [name] [name] MRN: [id number] DOB: 04/07/1941 Today's Date: 11/09/2018 Time: 1110-1120 SLP Time Calculation (min) (ACUTE ONLY): 10 min  Assessment / Plan / Recommendation Clinical Impression  Pt has been tolerating diet well, no coughing, O2 requirements improving. Wife has learned to thicken, drinks in room are appropriate. Pt has not been eating pureed meals. Trialed mech soft textures with ongoing signs of significant vallecular packing or even possible premature spill deep to pharynx as pt did not initiate swallow in between bites. Wife immediately aware asking, "did you swallow?" Pt may be exhibiting his baseline eating pattern. Will repeat MBS tomorrow for ability to upgrade liquids to thin. Will change current die to dys 2/nectar as mastication appears adequate. Wife in agreement.   HPI HPI: This is a 78 year old male with a history of Afib on coumain, COPD s/p LLL pneumonectomy, OSA, DM, HTN, HLD, GERD, gout, and memory difficulties with recent surgical fixation of the left femoral neck at [facility] who was extubated in the OR but required re-intubation for hypoxia and increased WOB. Transferred to [facility] for further management. Pt intubated for a 6 days. With extubation and reintubation the day of surgery. CXR 1/18: "Extensive bilateral airspace disease left greater than right" Head CT 1/17: no acute findings      SLP Plan  MBS       Recommendations  Diet recommendations: Dysphagia 2 (fine chop);Nectar-thick liquid Liquids provided via: Cup Medication Administration: Crushed with puree Supervision: Staff to assist with self feeding;Full supervision/cueing for compensatory strategies;Trained caregiver to feed patient Compensations: Slow rate;Small sips/bites;Effortful swallow Postural Changes and/or Swallow Maneuvers: Seated upright 90 degrees                Plan: MBS       GO                [name] [place], MA CCC-SLP  Acute Rehabilitation Services Pager [phone] Office [phone]  Lynann Beaver 11/09/2018, 11:27 AM

## 2018-11-09 NOTE — Progress Notes (Signed)
ANTICOAGULATION CONSULT NOTE - Follow Up Consult  Pharmacy Consult for Heparin, warfarin Indication: atrial fibrillation  Allergies  Allergen Reactions  . Contrast Media [Iodinated Diagnostic Agents] Rash  . Penicillins Rash    Did it involve swelling of the face/tongue/throat, SOB, or low BP? Unknown Did it involve sudden or severe rash/hives, skin peeling, or any reaction on the inside of your mouth or nose? Unknown Did you need to seek medical attention at a hospital or doctor's office? Unknown When did it last happen? If all above answers are "NO", may proceed with cephalosporin use.   . Quinidine Rash  . Quinine Derivatives Rash    quinidine  . Sulfa Antibiotics Rash    Patient Measurements: Height: 6\' 3"  (190.5 cm) Weight: 262 lb 5.6 oz (119 kg) IBW/kg (Calculated) : 84.5 Heparin Dosing Weight: 112 kg  Vital Signs: Temp: 97.7 F (36.5 C) (01/23 1100) Temp Source: Oral (01/23 1100) BP: 119/66 (01/23 1100) Pulse Rate: 113 (01/23 1100)  Labs: Recent Labs    11/07/18 0224 11/08/18 0543 11/09/18 0340 11/09/18 1134  HGB 11.2* 11.4* 11.4*  --   HCT 36.6* 37.0* 36.8*  --   PLT 187 236 261  --   LABPROT 16.3* 15.7* 15.6*  --   INR 1.32 1.27 1.25  --   HEPARINUNFRC 0.41 0.40 0.29* 0.56  CREATININE 0.93  --   --   --     Estimated Creatinine Clearance: 94 mL/min (by C-G formula based on SCr of 0.93 mg/dL).   Medications:  Scheduled:  . arformoterol  15 mcg Nebulization BID  . budesonide (PULMICORT) nebulizer solution  0.25 mg Nebulization BID  . chlorhexidine  15 mL Mouth Rinse BID  . diltiazem  45 mg Per Tube Q6H  . donepezil  10 mg Oral QHS  . finasteride  5 mg Oral Daily  . insulin aspart  0-15 Units Subcutaneous Q4H  . insulin glargine  7 Units Subcutaneous Daily  . mouth rinse  15 mL Mouth Rinse q12n4p  . polyethylene glycol  17 g Oral BID  . senna  1 tablet Oral QHS  . simvastatin  40 mg Oral q1800  . tamsulosin  0.4 mg Oral Daily  .  umeclidinium-vilanterol  1 puff Inhalation Daily  . Warfarin - Pharmacist Dosing Inpatient   Does not apply q1800    Assessment: 49 yoM presented [facility]/p [facility] and underwent fixation for hip fracture on 10/31/18, required re-intubation in the PACU and was transferred to [facility] for further management. PMH s/f afib on warfarin PTA. Pharmacy consulted to continue dosing warfarin with IV heparin bridge.  DDI: Patient was on Flagyl 1/16-17  PTA warfarin dose - 5mg  daily except 7.5mg  on Mon/Fri  Heparin level therapeutic at 0.56 on gtt at 2250 units/hr. INR remains SUBtherapeutic as expected at 1.25. CBC stable.    Goal of Therapy:  Heparin level 0.3-0.7 units/ml  INR 2-3 Monitor platelets by anticoagulation protocol: Yes   Plan:  Give warfarin 7.5 mg po x 1 Monitor daily INR, CBC, clinical course, s/sx of bleed, PO intake, DDI  Continue heparin gtt at 2250 units/hr Check anti-Xa level in 8 hours and daily while on heparin   Thank you for allowing [place] to participate in this patients care.   Jens Som, PharmD Please utilize Amion (under [place]) for appropriate number for your unit pharmacist. 11/09/2018 1:19 PM

## 2018-11-09 NOTE — Progress Notes (Signed)
PROGRESS NOTE  [name] [name] TKW:[id number] DOB: 1941/02/03 DOA: 11/02/2018 PCP: Monico Blitz, MD  Brief History   77 year old man PMH COPD, diabetes, atrial fibrillation presented as a transfer from [facility] postop day 2 of internal fixation left femoral neck fracture for further management of hypoxic respiratory failure.  A & P  Acute hypoxic, hypercapnic respiratory failure in the setting of COPD and bronchiectasis, admitted by pulmonary critical care and transferred to the hospital service 1/21. --Stable on 2 L nasal cannula.  Respiratory status appears stable.  Wean oxygen as tolerated. .  Aspiration pneumonia --Impressive on chest x-ray however patient continues to improve with minimal oxygen requirement.  Has improved during hospitalization on ceftriaxone, therefore antibiotics have not been adjusted to add anaerobic coverage at this time  COPD, bronchiectasis, status post left lower lobe pneumonectomy --Appears stable.  Continue bronchodilators  Dysphagia, initially kept n.p.o. per speech therapy --Appears to be tolerating dysphagia 1 diet, nectar thick liquids, medications crushed with pure as per speech pathology recommendation 1/22  Diabetes mellitus --CBG remains stable.  Continue Lantus, sliding scale insulin.  A. fib with rapid ventricular response --Appears rate controlled.  Continue Cardizem.  Continue warfarin per pharmacy, no need for bridge with heparin.  AKI --Resolved.  Status post left hip hemiarthroplasty at [facility] --Weight-bear as tolerated per orthopedics --Prophylactic Lovenox till INR therapeutic  Per progress notes 1/17: "Spoke with Dr. Cheral Marker about the CT results with the possible communicating hydrocephalus. He reviewed the CT scan and he stated that he does not feel like the CT results show a hydrocephalus and that it appears to be mostly just atrophy. Will not consult neurology at this time"  Dementia --Continue Aricept   Appears  to be improving.  Anticipate transfer to SNF 1/24 if continues to tolerate diet.  DVT prophylaxis: Prophylactic enoxaparin, warfarin Code Status: full Family Communication: Wife at bedside Disposition Plan: SNF   Murray Hodgkins, MD  [facility] Direct contact: see [email or web address]  7PM-7AM contact night coverage as above 11/09/2018, 2:16 PM  LOS: 7 days   Consultants  . Admitted by critical care  Procedures  1/12 > presented to [facility] 1/14 > left hemiarthroplasty 1/16 > transferred to [facility].  EEG Clinical Interpretation: This EEG is consistent with a generalized nonspecific cerebral dysfunction (encephalopathy). There was no seizure or seizure predisposition recorded on this study. Please note that lack of epileptiform activity on EEG does not preclude the possibility of epilepsy.   Antibiotics  . Ceftriaxone 1/16 > 1/23  Interval History/Subjective  Feeling well.  Breathing well.  Tolerating diet.  No complaints.  Objective   Vitals:  Vitals:   11/09/18 0746 11/09/18 1100  BP: 119/80 119/66  Pulse: 73 (!) 113  Resp: (!) 25 (!) 31  Temp: (!) 97.5 F (36.4 C) 97.7 F (36.5 C)  SpO2: 95% 95%    Exam:  Constitutional:   . Appears calm and comfortable Eyes:  . pupils and irises appear normal ENMT:  . grossly normal hearing  . Tongue appears unremarkable Respiratory:  . CTA bilaterally, no w/r/r.  . Respiratory effort normal.  Cardiovascular:  . RRR, no m/r/g . No LE extremity edema   Abdomen:  . Soft, nontender, nondistended Musculoskeletal:  . RLE, LLE   . Moves both feet to command Psychiatric:  . Mental status o Mood, affect grossly appropriate  I have personally reviewed the following:   Today's Data  . CBG stable  WBC stable at 13.6, hemoglobin 11.4, no significant change.  Platelets within normal limits.  Lab Data  .   Micro Data  . Negative respiratory and blood cultures  Imaging  . Chest x-ray noted, extensive left-sided  infiltrate  Cardiology Data  .   Other Data  .   Scheduled Meds: . arformoterol  15 mcg Nebulization BID  . budesonide (PULMICORT) nebulizer solution  0.25 mg Nebulization BID  . chlorhexidine  15 mL Mouth Rinse BID  . diltiazem  45 mg Per Tube Q6H  . donepezil  10 mg Oral QHS  . enoxaparin (LOVENOX) injection  40 mg Subcutaneous Q24H  . finasteride  5 mg Oral Daily  . insulin aspart  0-15 Units Subcutaneous Q4H  . insulin glargine  7 Units Subcutaneous Daily  . mouth rinse  15 mL Mouth Rinse q12n4p  . polyethylene glycol  17 g Oral BID  . senna  1 tablet Oral QHS  . simvastatin  40 mg Oral q1800  . tamsulosin  0.4 mg Oral Daily  . umeclidinium-vilanterol  1 puff Inhalation Daily  . warfarin  7.5 mg Oral ONCE-1800  . Warfarin - Pharmacist Dosing Inpatient   Does not apply q1800   Continuous Infusions:   Principal Problem:   Aspiration pneumonia (HCC) Active Problems:   Atrial fibrillation (HCC)   COPD (chronic obstructive pulmonary disease) (HCC)   Acute respiratory failure with hypoxia and hypercapnia (HCC)   Bronchiectasis (HCC)   History of total hip replacement, left   CKD (chronic kidney disease), stage III (HCC)   BPH (benign prostatic hyperplasia)   Dysphagia   LOS: 7 days

## 2018-11-09 NOTE — Progress Notes (Signed)
ANTICOAGULATION CONSULT NOTE - Follow Up Consult  Pharmacy Consult for Heparin, coumadin Indication: atrial fibrillation  Allergies  Allergen Reactions  . Contrast Media [Iodinated Diagnostic Agents] Rash  . Penicillins Rash    Did it involve swelling of the face/tongue/throat, SOB, or low BP? Unknown Did it involve sudden or severe rash/hives, skin peeling, or any reaction on the inside of your mouth or nose? Unknown Did you need to seek medical attention at a hospital or doctor's office? Unknown When did it last happen? If all above answers are "NO", may proceed with cephalosporin use.   . Quinidine Rash  . Quinine Derivatives Rash    quinidine  . Sulfa Antibiotics Rash    Patient Measurements: Height: 6\' 3"  (190.5 cm) Weight: 262 lb 5.6 oz (119 kg) IBW/kg (Calculated) : 84.5 Heparin Dosing Weight: 112 kg  Vital Signs: Temp: 99.7 F (37.6 C) (01/22 2324) Temp Source: Oral (01/22 2324) BP: 109/67 (01/23 0344) Pulse Rate: 67 (01/23 0344)  Labs: Recent Labs    11/07/18 0224 11/08/18 0543 11/09/18 0340  HGB 11.2* 11.4* 11.4*  HCT 36.6* 37.0* 36.8*  PLT 187 236 261  LABPROT 16.3* 15.7*  --   INR 1.32 1.27  --   HEPARINUNFRC 0.41 0.40 0.29*  CREATININE 0.93  --   --     Estimated Creatinine Clearance: 94 mL/min (by C-G formula based on SCr of 0.93 mg/dL).   Medications:  Scheduled:  . arformoterol  15 mcg Nebulization BID  . budesonide (PULMICORT) nebulizer solution  0.25 mg Nebulization BID  . chlorhexidine  15 mL Mouth Rinse BID  . diltiazem  45 mg Per Tube Q6H  . donepezil  5 mg Oral QHS  . finasteride  5 mg Oral Daily  . insulin aspart  0-15 Units Subcutaneous Q4H  . insulin glargine  7 Units Subcutaneous Daily  . mouth rinse  15 mL Mouth Rinse q12n4p  . polyethylene glycol  17 g Oral Daily  . simvastatin  40 mg Oral q1800  . Warfarin - Pharmacist Dosing Inpatient   Does not apply q1800    Assessment: 72 yoM presented to [facility] s/p  fall and underwent fixation for hip fracture on 10/31/18, required re-intubation in the PACU and was transferred to [facility] for further management. PMH s/f afib on warfarin PTA. Pharmacy consulted to continue dosing warfarin with IV heparin bridge.  PTA warfarin dose - 5mg  daily except 7.5mg  on Mon/Fri  Heparin level down to slightly subtherapeutic (0.29) on gtt at 2100 units/hr.  CBC stable.    Goal of Therapy:  Heparin level 0.3-0.7 units/ml  INR 2-3 Monitor platelets by anticoagulation protocol: Yes   Plan:  Increase heparin gtt to 2250 units/hr Will f/u 8 hr heparin level  Sherlon Handing, PharmD, BCPS Clinical pharmacist  **Pharmacist phone directory can now be found on [email or web address] ([email or web address]).  Listed under [place]. 11/09/2018 4:23 AM

## 2018-11-10 ENCOUNTER — Inpatient Hospital Stay (HOSPITAL_COMMUNITY): Payer: Medicare Other

## 2018-11-10 DIAGNOSIS — R404 Transient alteration of awareness: Secondary | ICD-10-CM | POA: Diagnosis not present

## 2018-11-10 DIAGNOSIS — R531 Weakness: Secondary | ICD-10-CM | POA: Diagnosis not present

## 2018-11-10 DIAGNOSIS — J449 Chronic obstructive pulmonary disease, unspecified: Secondary | ICD-10-CM | POA: Diagnosis not present

## 2018-11-10 DIAGNOSIS — R1312 Dysphagia, oropharyngeal phase: Secondary | ICD-10-CM | POA: Diagnosis not present

## 2018-11-10 DIAGNOSIS — R1310 Dysphagia, unspecified: Secondary | ICD-10-CM | POA: Diagnosis not present

## 2018-11-10 DIAGNOSIS — I4891 Unspecified atrial fibrillation: Secondary | ICD-10-CM | POA: Diagnosis not present

## 2018-11-10 DIAGNOSIS — E119 Type 2 diabetes mellitus without complications: Secondary | ICD-10-CM | POA: Diagnosis not present

## 2018-11-10 DIAGNOSIS — J9602 Acute respiratory failure with hypercapnia: Secondary | ICD-10-CM | POA: Diagnosis not present

## 2018-11-10 DIAGNOSIS — Z96642 Presence of left artificial hip joint: Secondary | ICD-10-CM | POA: Diagnosis not present

## 2018-11-10 DIAGNOSIS — Z7401 Bed confinement status: Secondary | ICD-10-CM | POA: Diagnosis not present

## 2018-11-10 DIAGNOSIS — J690 Pneumonitis due to inhalation of food and vomit: Secondary | ICD-10-CM | POA: Diagnosis not present

## 2018-11-10 DIAGNOSIS — M2550 Pain in unspecified joint: Secondary | ICD-10-CM | POA: Diagnosis not present

## 2018-11-10 DIAGNOSIS — J9601 Acute respiratory failure with hypoxia: Secondary | ICD-10-CM | POA: Diagnosis not present

## 2018-11-10 DIAGNOSIS — R410 Disorientation, unspecified: Secondary | ICD-10-CM | POA: Diagnosis not present

## 2018-11-10 DIAGNOSIS — R0902 Hypoxemia: Secondary | ICD-10-CM | POA: Diagnosis not present

## 2018-11-10 DIAGNOSIS — J189 Pneumonia, unspecified organism: Secondary | ICD-10-CM | POA: Diagnosis not present

## 2018-11-10 DIAGNOSIS — J9610 Chronic respiratory failure, unspecified whether with hypoxia or hypercapnia: Secondary | ICD-10-CM | POA: Diagnosis not present

## 2018-11-10 DIAGNOSIS — R4182 Altered mental status, unspecified: Secondary | ICD-10-CM | POA: Diagnosis not present

## 2018-11-10 LAB — CBC
HCT: 36.7 % — ABNORMAL LOW (ref 39.0–52.0)
Hemoglobin: 11 g/dL — ABNORMAL LOW (ref 13.0–17.0)
MCH: 26.6 pg (ref 26.0–34.0)
MCHC: 30 g/dL (ref 30.0–36.0)
MCV: 88.9 fL (ref 80.0–100.0)
Platelets: 282 10*3/uL (ref 150–400)
RBC: 4.13 MIL/uL — ABNORMAL LOW (ref 4.22–5.81)
RDW: 17.5 % — ABNORMAL HIGH (ref 11.5–15.5)
WBC: 14.2 10*3/uL — ABNORMAL HIGH (ref 4.0–10.5)
nRBC: 0 % (ref 0.0–0.2)

## 2018-11-10 LAB — GLUCOSE, CAPILLARY
Glucose-Capillary: 103 mg/dL — ABNORMAL HIGH (ref 70–99)
Glucose-Capillary: 133 mg/dL — ABNORMAL HIGH (ref 70–99)
Glucose-Capillary: 163 mg/dL — ABNORMAL HIGH (ref 70–99)
Glucose-Capillary: 180 mg/dL — ABNORMAL HIGH (ref 70–99)
Glucose-Capillary: 150 mg/dL — ABNORMAL HIGH (ref 70–99)

## 2018-11-10 LAB — PROTIME-INR
INR: 1.23
Prothrombin Time: 15.4 seconds — ABNORMAL HIGH (ref 11.4–15.2)

## 2018-11-10 MED ORDER — POLYETHYLENE GLYCOL 3350 17 G PO PACK: 17.0000 g | PACK | Freq: Two times a day (BID) | ORAL | Status: AC

## 2018-11-10 MED ORDER — RESOURCE THICKENUP CLEAR PO POWD: ORAL | Status: AC

## 2018-11-10 MED ORDER — ENOXAPARIN SODIUM 40 MG/0.4ML ~~LOC~~ SOLN: 40.0000 mg | SUBCUTANEOUS | Status: AC

## 2018-11-10 MED ORDER — DILTIAZEM HCL 30 MG PO TABS
45.0000 mg | ORAL_TABLET | Freq: Four times a day (QID) | ORAL | Status: DC
  Administered 2018-11-10: 45 mg via ORAL
  Filled 2018-11-10: qty 2

## 2018-11-10 MED ORDER — WARFARIN SODIUM 10 MG PO TABS
10.0000 mg | ORAL_TABLET | Freq: Once | ORAL | Status: AC
  Administered 2018-11-10: 10 mg via ORAL
  Filled 2018-11-10: qty 1

## 2018-11-10 NOTE — Discharge Summary (Addendum)
Physician Discharge Summary  [name] [name] EYC:[id number] DOB: [birth year] DOA: 11/02/2018  PCP: Monico Blitz, MD  Admit date: 11/02/2018 Discharge date: 11/10/2018  Recommendations for Outpatient Follow-up:   Acute hypoxic, hypercapnic respiratory failure in the setting of COPD and bronchiectasis, admitted by pulmonary critical care and transferred to the hospital service 1/21. -- Wean oxygen as tolerated as an outpatient.  Aspiration pneumonia Suggest repeat chest x-ray 3 weeks to assess for radiographic healing.  Dysphagia.  Diet as per speech therapy recommend speech therapy in the outpatient setting.  Status post left hip hemiarthroplasty at [facility] --Weight-bear as tolerated per orthopedics --Prophylactic Lovenox till INR therapeutic   Contact information for follow-up providers    Monico Blitz, MD. Schedule an appointment as soon as possible for a visit in 3 week(s).   Specialty:  Internal Medicine Contact information: [place] [address] [phone]            Contact information for after-discharge care    Destination    [facility] .   Service:  Skilled Nursing Contact information: [address] [phone]                   Discharge Diagnoses: Principal diagnosis is #1 1. Acute hypoxic, hypercapnic respiratory failure in the setting of COPD and bronchiectasis  2. Aspiration pneumonia 3. COPD, bronchiectasis, status post left lower lobe pneumonectomy 4. Dysphagia 5. Diabetes mellitus type 2 6. Atrial fibrillation 7. AKI 8. Status post left hip hemiarthroplasty at [facility]  Discharge Condition: improved Disposition: short term SNF  Diet recommendation:  SLP Diet Recommendations: Dysphagia 3 (Mech soft) solids;Nectar thick liquid   Liquid Administration via: Cup;Straw   Medication Administration: whole with  applesauce   Supervision: Patient able to self feed   Compensations: Slow rate;Small sips/bites   Postural Changes: Remain semi-upright after after feeds/meals (Comment)   Oral Care Recommendations: Oral care BID  Filed Weights   11/06/18 0445 11/07/18 0500 11/08/18 0349  Weight: 126.8 kg 117.7 kg 119 kg    History of present illness:  78 year old man PMH COPD, diabetes, atrial fibrillation presented as a transfer from [facility] postop day 2 of internal fixation left femoral neck fracture for further management of hypoxic respiratory failure.  Hospital Course:  Patient was followed by critical care, treated with empiric antibiotics and gradually improved and was subsequently extubated and transferred to the hospitalist service.  Respiratory status has remained stable on 2 L nasal cannula.  He was seen by speech therapy and diet was advanced.  He is tolerating diet.  Chronic comorbidities including COPD and bronchiectasis appears stable at this time.  He was briefly evaluated by orthopedics with recommendations for weight-bear as tolerated.  Individual issues as below.  Acute hypoxic, hypercapnic respiratory failure in the setting of COPD and bronchiectasis, admitted by pulmonary critical care and transferred to the hospital service 1/21. --Remained stable on 2 L nasal cannula.  Wean oxygen as tolerated as an outpatient.  Aspiration pneumonia --Afebrile, oxygen requirement minimal and stable.  Completed a course of antibiotics.  Suggest repeat chest x-ray 3 weeks to assess for radiographic healing.  COPD, bronchiectasis, status post left lower lobe pneumonectomy --Stable.  Continue bronchodilators.  Dysphagia, initially kept n.p.o. per speech therapy --Diet as per speech therapy.  Diabetes mellitus --CBG stable.  Resume glipizide and metformin on discharge.  Recommend sliding scale insulin.  Atrial fibrillation --Rate controlled.  Continue Cardizem.  Continue  warfarin per pharmacy.   AKI --Resolved.  Status post left hip hemiarthroplasty at [facility] --Weight-bear as tolerated per orthopedics --Prophylactic Lovenox till INR therapeutic  Per progress notes 1/17: "Spoke with Dr. Cheral Marker about the CT results with the possible communicating hydrocephalus. He reviewed the CT scan and he stated that he does not feel like the CT results show a hydrocephalus and that it appears to be mostly just atrophy."  Dementia --Continue Aricept  Consultants   Admitted by critical care  Procedures  1/12 > presented to [facility] 1/14 > left hemiarthroplasty 1/16 > transferred to [facility].  EEG Clinical Interpretation: ThisEEG is consistent with a generalized nonspecific cerebral dysfunction (encephalopathy). There was no seizure or seizure predisposition recorded on this study. Please note that lack of epileptiform activity on EEG does not preclude the possibility of epilepsy.   Antibiotics   Ceftriaxone 1/16 > 1/23  Today's assessment: S: Feels well today, breathing fine, eating fine, no complaints. O: Vitals:  Vitals:   11/10/18 0841 11/10/18 0842  BP:    Pulse: 67   Resp: 18   Temp:    SpO2: (!) 87% 93%    Constitutional:  . Appears calm and comfortable Respiratory:  . CTA bilaterally, no w/r/r.  . Respiratory effort normal.  Cardiovascular:  . RRR, no m/r/g . No LE extremity edema   Musculoskeletal:  . RLE, LLE   . Moves both legs to command. Psychiatric:  . Mental status o Mood, affect appropriate o Follows commands  CBG stable. WBC without significant change, 14.2.  Hemoglobin stable 11.0.  Platelets within normal limits.  INR 1.23.  Discharge Instructions   Allergies as of 11/10/2018      Reactions   Contrast Media [iodinated Diagnostic Agents] Rash   Penicillins Rash   Did it involve swelling of the face/tongue/throat, SOB, or low BP? Unknown Did it involve sudden or severe rash/hives, skin peeling, or any  reaction on the inside of your mouth or nose? Unknown Did you need to seek medical attention at a hospital or doctor's office? Unknown When did it last happen? If all above answers are "NO", may proceed with cephalosporin use.   Quinidine Rash   Quinine Derivatives Rash   quinidine   Sulfa Antibiotics Rash      Medication List    STOP taking these medications   rivaroxaban 20 MG Tabs tablet Commonly known as:  XARELTO     TAKE these medications   acetaminophen 500 MG tablet Commonly known as:  TYLENOL Take 500 mg by mouth every 6 (six) hours as needed for headache.   acidophilus Caps capsule Take 1 capsule by mouth daily.   albuterol 108 (90 Base) MCG/ACT inhaler Commonly known as:  PROVENTIL HFA;VENTOLIN HFA Inhale 2 puffs into the lungs every 6 (six) hours as needed. For shortness of breath   ANORO ELLIPTA 62.5-25 MCG/INH Aepb Generic drug:  umeclidinium-vilanterol Inhale 1 puff into the lungs daily.   azithromycin 250 MG tablet Commonly known as:  ZITHROMAX Take 250 mg by mouth every Monday, Wednesday, and Friday. For broncho infection   diltiazem 180 MG 24 hr capsule Commonly known as:  CARDIZEM CD Take 1 capsule (180 mg total) by mouth daily.   donepezil 10 MG tablet Commonly known as:  ARICEPT Take 10 mg by mouth at bedtime.   enoxaparin 40 MG/0.4ML injection Commonly known as:  LOVENOX Inject 0.4 mLs (40 mg total) into the skin daily. Use until INR therapeutic 2-3,  then stop enoxaparin.   finasteride 5 MG tablet Commonly known as:  PROSCAR Take 5 mg by mouth at bedtime.   furosemide 20 MG tablet Commonly known as:  LASIX Take 20 mg by mouth as needed for fluid.   glipiZIDE 5 MG 24 hr tablet Commonly known as:  GLUCOTROL XL Take 5 mg by mouth daily.   guaiFENesin 600 MG 12 hr tablet Commonly known as:  MUCINEX Take 1,200 mg by mouth daily.   ipratropium-albuterol 0.5-2.5 (3) MG/3ML Soln Commonly known as:  DUONEB Take 3 mLs by  nebulization every 6 (six) hours as needed (sob/wheezing).   loratadine 10 MG tablet Commonly known as:  CLARITIN Take 10 mg by mouth daily.   metFORMIN 500 MG tablet Commonly known as:  GLUCOPHAGE Take 500 mg by mouth daily.   omeprazole 40 MG capsule Commonly known as:  PRILOSEC Take 40 mg by mouth daily.   polyethylene glycol packet Commonly known as:  MIRALAX / GLYCOLAX Take 17 g by mouth 2 (two) times daily.   RESOURCE THICKENUP CLEAR Powd Use as needed to thicken liquids.   simvastatin 40 MG tablet Commonly known as:  ZOCOR Take 40 mg by mouth at bedtime.   tamsulosin 0.4 MG Caps capsule Commonly known as:  FLOMAX Take 0.4 mg by mouth daily.   warfarin 5 MG tablet Commonly known as:  COUMADIN Take 5-7.5 mg by mouth daily. Except warfarin 7.5mg  on Monday and Friday, 5mg  all other days      Allergies  Allergen Reactions  . Contrast Media [Iodinated Diagnostic Agents] Rash  . Penicillins Rash    Did it involve swelling of the face/tongue/throat, SOB, or low BP? Unknown Did it involve sudden or severe rash/hives, skin peeling, or any reaction on the inside of your mouth or nose? Unknown Did you need to seek medical attention at a hospital or doctor's office? Unknown When did it last happen? If all above answers are "NO", may proceed with cephalosporin use.   . Quinidine Rash  . Quinine Derivatives Rash    quinidine  . Sulfa Antibiotics Rash    The results of significant diagnostics from this hospitalization (including imaging, microbiology, ancillary and laboratory) are listed below for reference.    Significant Diagnostic Studies: Dg Abd 1 View  Result Date: 11/02/2018 CLINICAL DATA:  Orogastric tube placement. EXAM: ABDOMEN - 1 VIEW COMPARISON:  None. FINDINGS: Orogastric tube tip and side hole in the stomach. Normal bowel gas pattern. Chest findings described on the chest radiograph report. Lower lumbar spine interbody and hardware fixation. Lumbar  and lower thoracic spine degenerative changes. IMPRESSION: Orogastric tube tip and side hole in the stomach. No acute abnormality in the abdomen. Electronically Signed   By: Claudie Revering M.D.   On: 11/02/2018 20:25   Ct Head Wo Contrast  Result Date: 11/03/2018 CLINICAL DATA:  Recent fall EXAM: CT HEAD WITHOUT CONTRAST TECHNIQUE: Contiguous axial images were obtained from the base of the skull through the vertex without intravenous contrast. COMPARISON:  October 29, 2018 FINDINGS: Brain: There is moderate ventricular enlargement with milder sulcal enlargement. There is no intracranial mass, hemorrhage, extra-axial fluid collection, or midline shift. There is mild small vessel disease in the centra semiovale bilaterally. No acute appearing infarct is evident. Vascular: There is no hyperdense vessel. There is calcification in each carotid siphon region. Skull: Bony calvarium appears intact. There is a small right frontal scalp hematoma. Sinuses/Orbits: There is opacification in the right maxillary antrum. Patient has had antrostomies bilaterally.  There is mucosal thickening in several ethmoid air cells. Orbits appear symmetric bilaterally. Other: There are scattered areas of mastoid air cell disease bilaterally. IMPRESSION: Atrophy with ventricles in proportion larger than sulci. A degree of underlying communicating hydrocephalus not be excluded. Mild periventricular small vessel disease. No acute infarct evident. No mass or hemorrhage. There are foci of arterial vascular calcification. There is a small right frontal scalp hematoma.  No fracture evident. There are areas of paranasal sinus and mastoid air cell disease. Postoperative antrostomies bilaterally. Electronically Signed   By: Lowella Grip III M.D.   On: 11/03/2018 11:24   Dg Chest Port 1 View  Result Date: 11/04/2018 CLINICAL DATA:  Respiratory failure EXAM: PORTABLE CHEST 1 VIEW COMPARISON:  11/03/2018 FINDINGS: Endotracheal tube in satisfactory  position.  NG tube in the stomach. Extensive airspace disease on the left is unchanged. Some of this is due to chronic pleuroparenchymal scarring however there superimposed acute disease particularly in the left lower lobe. Right lower lobe airspace disease unchanged. Vascular congestion on the right appears improved. Small right effusion. IMPRESSION: Endotracheal tube remains in good position. Extensive bilateral airspace disease left greater than right. Apparent improvement in vascular congestion. Electronically Signed   By: Franchot Gallo M.D.   On: 11/04/2018 07:06   Dg Chest Port 1 View  Result Date: 11/03/2018 CLINICAL DATA:  Respiratory failure EXAM: PORTABLE CHEST 1 VIEW COMPARISON:  11/02/2018 FINDINGS: Cardiac shadow is stable. Endotracheal tube and nasogastric catheter are again seen in satisfactory position. Increasing opacification the left hemithorax is noted likely related to an enlarging effusion and infiltrate. Right basilar infiltrate is again noted. No bony abnormality is seen. IMPRESSION: Increasing opacification of the left hemithorax. The remainder of the exam is stable. Electronically Signed   By: Inez Catalina M.D.   On: 11/03/2018 07:26   Dg Chest Port 1 View  Result Date: 11/02/2018 CLINICAL DATA:  Respiratory failure with hypoxia. Orogastric tube placement. EXAM: PORTABLE CHEST 1 VIEW COMPARISON:  08/12/2011. FINDINGS: Endotracheal tube in satisfactory position. Orogastric tube extending into the stomach. Enlarged cardiac silhouette. Moderate-sized left pleural effusion, similar to the previous examination. Interval patchy opacity the mid and upper portions of the left lung. Interval patchy opacity the right lung base. Progressive prominence of the interstitial markings with normal vascularity. Thoracic spine and right shoulder degenerative changes. IMPRESSION: 1. Interval bilateral probable pneumonia. 2. Stable moderate-sized left pleural effusion/scarring. 3. Progressive chronic  interstitial lung disease. Electronically Signed   By: Claudie Revering M.D.   On: 11/02/2018 20:24   Dg Swallowing Func-speech Pathology  Result Date: 11/10/2018 Objective Swallowing Evaluation: Type of Study: MBS-Modified Barium Swallow Study  Patient Details Name: [name] [name] MRN: [id number] Date of Birth: 09-Sep-1941 Today's Date: 11/10/2018 Time: SLP Start Time (ACUTE ONLY): 1440 -SLP Stop Time (ACUTE ONLY): 1500 SLP Time Calculation (min) (ACUTE ONLY): 20 min Past Medical History: Past Medical History: Diagnosis Date . Arthritis  . Bronchiectasis   L lung lobe removed . Chronic bronchitis ([place])  . COPD (chronic obstructive pulmonary disease) ([place])  . Essential hypertension, benign  . GERD (gastroesophageal reflux disease)  . History of gout  . Hypercholesteremia  . Impaired vision   Wears contacts or glasses . Left atrial enlargement   LA size 6mm by echo . Memory difficulties   Forgetfullness, taking Aricept preventatively, father had Alzheimers . Nephrolithiasis  . Obesity  . OSA (obstructive sleep apnea)  . Persistent atrial fibrillation  . Recurrent upper respiratory infection (URI)  . Type 2  diabetes mellitus ([place])  Past Surgical History: Past Surgical History: Procedure Laterality Date . CARDIAC CATHETERIZATION  6/09  Normal coronaries . CARDIOVERSION  08/2012  successfully restored to NSR . CARDIOVERSION  08/26/2012  Procedure: CARDIOVERSION;  Surgeon: Thompson Grayer, MD;  Location: [facility] OR;  Service: Cardiovascular;  Laterality: N/A; . INCISE AND DRAIN ABCESS  2006  Posterior neck . [place] SURGERY  2010 . LUNG LOBECTOMY  1959  Left . REPLACEMENT DISC ANTERIOR LUMBAR SPINE  2011  "put rods in" (08/23/2012) . TONSILLECTOMY  ~ 1946 . TOTAL HIP ARTHROPLASTY  08/23/2011  Procedure: TOTAL HIP ARTHROPLASTY;  Surgeon: Rudean Haskell, MD;  Location: [place];  Service: Orthopedics;  Laterality: Right; . URETEROLITHOTOMY    stone removed from ureter HPI: This is a 78 year old male with a history of Afib on coumain,  COPD s/p LLL pneumonectomy, OSA, DM, HTN, HLD, GERD, gout, and memory difficulties with recent surgical fixation of the left femoral neck at [facility] who was extubated in the OR but required re-intubation for hypoxia and increased WOB. Transferred to [facility] for further management. Pt intubated for a 6 days. With extubation and reintubation the day of surgery. CXR 1/18: "Extensive bilateral airspace disease left greater than right" Head CT 1/17: no acute findings  Subjective: Pt awake, alert, participative Assessment / Plan / Recommendation CHL IP CLINICAL IMPRESSIONS 11/10/2018 Clinical Impression Pt demonstrates significant improvement in cognition and strength since prior MBS. Pt now with mostly automatic response with majority of swallows with normal function. However, in 3-5 instances there was trace penetration before the swallow due to premature spillage of bolus to pharynx and at least two visualized instances of sensed aspiration (pt positioning very poor with inabilty to see the larynx on several swallows due to shoulders). Cough response congested but strong and protective. If pt were able to consistently take single sips or slow rate he may be able to reduce aspiration events. For now given ongoing recovery from hip fx and pna recommend pt remain on nectar thick liquids though as his pumonary health, cough strength and mobility improve would expect pt to upgrade to thin liquids and should not need thickened liquids over the long term. Discussed with wife.  SLP Visit Diagnosis Dysphagia, oropharyngeal phase (R13.12) Attention and concentration deficit following -- Frontal lobe and executive function deficit following -- Impact on safety and function Mild aspiration risk   CHL IP TREATMENT RECOMMENDATION 11/10/2018 Treatment Recommendations Therapy as outlined in treatment plan below   Prognosis 11/06/2018 Prognosis for Safe Diet Advancement Fair Barriers to Reach Goals Cognitive deficits Barriers/Prognosis  Comment -- CHL IP DIET RECOMMENDATION 11/10/2018 SLP Diet Recommendations Dysphagia 3 (Mech soft) solids;Nectar thick liquid Liquid Administration via Cup;Straw Medication Administration Via alternative means Compensations Slow rate;Small sips/bites Postural Changes Remain semi-upright after after feeds/meals (Comment)   CHL IP OTHER RECOMMENDATIONS 11/10/2018 Recommended Consults -- Oral Care Recommendations Oral care BID Other Recommendations --   CHL IP FOLLOW UP RECOMMENDATIONS 11/10/2018 Follow up Recommendations Skilled Nursing facility   [facility] IP FREQUENCY AND DURATION 11/10/2018 Speech Therapy Frequency (ACUTE ONLY) min 2x/week Treatment Duration 2 weeks      CHL IP ORAL PHASE 11/10/2018 Oral Phase WFL Oral - Pudding Teaspoon -- Oral - Pudding Cup -- Oral - Honey Teaspoon -- Oral - Honey Cup -- Oral - Nectar Teaspoon -- Oral - Nectar Cup -- Oral - Nectar Straw -- Oral - Thin Teaspoon -- Oral - Thin Cup -- Oral - Thin Straw -- Oral -  Puree -- Oral - Mech Soft -- Oral - Regular -- Oral - Multi-Consistency -- Oral - Pill -- Oral Phase - Comment --  CHL IP PHARYNGEAL PHASE 11/10/2018 Pharyngeal Phase Impaired Pharyngeal- Pudding Teaspoon -- Pharyngeal -- Pharyngeal- Pudding Cup -- Pharyngeal -- Pharyngeal- Honey Teaspoon -- Pharyngeal -- Pharyngeal- Honey Cup NT Pharyngeal -- Pharyngeal- Nectar Teaspoon -- Pharyngeal -- Pharyngeal- Nectar Cup [facility] Pharyngeal Material enters airway, remains ABOVE vocal cords then ejected out;Material does not enter airway Pharyngeal- Nectar Straw WFL Pharyngeal -- Pharyngeal- Thin Teaspoon -- Pharyngeal -- Pharyngeal- Thin Cup Penetration/Aspiration before swallow;Trace aspiration Pharyngeal Material enters airway, CONTACTS cords and then ejected out;Material enters airway, remains ABOVE vocal cords and not ejected out;Material does not enter airway Pharyngeal- Thin Straw Penetration/Aspiration before swallow;Trace aspiration Pharyngeal Material enters airway, CONTACTS cords and then  ejected out;Material enters airway, remains ABOVE vocal cords and not ejected out;Material does not enter airway Pharyngeal- Puree -- Pharyngeal -- Pharyngeal- Mechanical Soft -- Pharyngeal -- Pharyngeal- Regular -- Pharyngeal -- Pharyngeal- Multi-consistency -- Pharyngeal -- Pharyngeal- Pill -- Pharyngeal -- Pharyngeal Comment --  CHL IP CERVICAL ESOPHAGEAL PHASE 11/06/2018 Cervical Esophageal Phase WFL Pudding Teaspoon -- Pudding Cup -- Honey Teaspoon -- Honey Cup -- Nectar Teaspoon -- Nectar Cup -- Nectar Straw -- Thin Teaspoon -- Thin Cup -- Thin Straw -- Puree -- Mechanical Soft -- Regular -- Multi-consistency -- Pill -- Cervical Esophageal Comment -- Herbie Baltimore, MA CCC-SLP Acute Rehabilitation Services Pager [phone] Office [phone] Lynann Beaver 11/10/2018, 2:18 PM              Dg Swallowing Func-speech Pathology  Result Date: 11/06/2018 Objective Swallowing Evaluation: Type of Study: MBS-Modified Barium Swallow Study  Patient Details Name: [name] [name] MRN: [id number] Date of Birth: [birth year] Today's Date: 11/06/2018 Time: SLP Start Time (ACUTE ONLY): 1435 -SLP Stop Time (ACUTE ONLY): 1457 SLP Time Calculation (min) (ACUTE ONLY): 22 min Past Medical History: Past Medical History: Diagnosis Date . Arthritis  . Bronchiectasis   L lung lobe removed . Chronic bronchitis ([place])  . COPD (chronic obstructive pulmonary disease) ([place])  . Essential hypertension, benign  . GERD (gastroesophageal reflux disease)  . History of gout  . Hypercholesteremia  . Impaired vision   Wears contacts or glasses . Left atrial enlargement   LA size 36mm by echo . Memory difficulties   Forgetfullness, taking Aricept preventatively, father had Alzheimers . Nephrolithiasis  . Obesity  . OSA (obstructive sleep apnea)  . Persistent atrial fibrillation  . Recurrent upper respiratory infection (URI)  . Type 2 diabetes mellitus ([place])  Past Surgical History: Past Surgical History: Procedure Laterality Date . CARDIAC  CATHETERIZATION  6/09  Normal coronaries . CARDIOVERSION  08/2012  successfully restored to NSR . CARDIOVERSION  08/26/2012  Procedure: CARDIOVERSION;  Surgeon: Thompson Grayer, MD;  Location: [facility] OR;  Service: Cardiovascular;  Laterality: N/A; . INCISE AND DRAIN ABCESS  2006  Posterior neck . [place] SURGERY  2010 . LUNG LOBECTOMY  1959  Left . REPLACEMENT DISC ANTERIOR LUMBAR SPINE  2011  "put rods in" (08/23/2012) . TONSILLECTOMY  ~ 1946 . TOTAL HIP ARTHROPLASTY  08/23/2011  Procedure: TOTAL HIP ARTHROPLASTY;  Surgeon: Rudean Haskell, MD;  Location: [place];  Service: Orthopedics;  Laterality: Right; . URETEROLITHOTOMY    stone removed from ureter HPI: This is a 78 year old male with a history of Afib on coumain, COPD s/p LLL pneumonectomy, OSA, DM, HTN, HLD, GERD, gout, and memory difficulties with recent surgical fixation of  the left femoral neck at [facility] who was extubated in the OR but required re-intubation for hypoxia and increased WOB. Transferred to [facility] for further management. Pt intubated for a 6 days. With extubation and reintubation the day of surgery. CXR 1/18: "Extensive bilateral airspace disease left greater than right" Head CT 1/17: no acute findings  Subjective: Pt awake, alert, participative Assessment / Plan / Recommendation CHL IP CLINICAL IMPRESSIONS 11/06/2018 Clinical Impression Pt presents with mild oropharyngeal dysphagia c/b delayed swallow initiation, decreased base of tongue retraction, reduced epiglottic inversion, incomplete laryngeal closure, reduced hyolaryngeal excursion, and diminished sensation.  These deficits resulted in mild penetration of thin liquid before the swallow, with suspected unvisualized aspiration after the swallow, and trace static penetration of nectar and honey thick liquids.  Cued cough did not clear penetration.  There was severe vallecula stasis of puree and solid textures with very little passage of bolus into esophagus.  Residuals were very difficult to  clear even with mulitple swallows and liquid wash.  If pt continues to follow directions and remains alert, he may be able to participate in exercise program to improve pharyngeal swallow function.  Recommend pt remain NPO at present with alternate means of nutrition, hydration, and medication.  Allow ice chips for pt comfort after good oral care, in moderation, when pt is fully awake/alert, with supervision and upright positioning. SLP Visit Diagnosis Dysphagia, oropharyngeal phase (R13.12) Attention and concentration deficit following -- Frontal lobe and executive function deficit following -- Impact on safety and function Moderate aspiration risk   CHL IP TREATMENT RECOMMENDATION 11/06/2018 Treatment Recommendations F/U MBS in --- days (Comment);Therapy as outlined in treatment plan below   Prognosis 11/06/2018 Prognosis for Safe Diet Advancement Fair Barriers to Reach Goals Cognitive deficits Barriers/Prognosis Comment -- CHL IP DIET RECOMMENDATION 11/06/2018 SLP Diet Recommendations NPO;Alternative means - temporary;Ice chips PRN after oral care Liquid Administration via -- Medication Administration Via alternative means Compensations -- Postural Changes --   CHL IP OTHER RECOMMENDATIONS 11/06/2018 Recommended Consults -- Oral Care Recommendations Oral care QID Other Recommendations --   No flowsheet data found.  No flowsheet data found.     CHL IP ORAL PHASE 11/06/2018 Oral Phase WFL Oral - Pudding Teaspoon -- Oral - Pudding Cup -- Oral - Honey Teaspoon -- Oral - Honey Cup -- Oral - Nectar Teaspoon -- Oral - Nectar Cup -- Oral - Nectar Straw -- Oral - Thin Teaspoon -- Oral - Thin Cup -- Oral - Thin Straw -- Oral - Puree -- Oral - Mech Soft -- Oral - Regular -- Oral - Multi-Consistency -- Oral - Pill -- Oral Phase - Comment --  CHL IP PHARYNGEAL PHASE 11/06/2018 Pharyngeal Phase Impaired Pharyngeal- Pudding Teaspoon -- Pharyngeal -- Pharyngeal- Pudding Cup -- Pharyngeal -- Pharyngeal- Honey Teaspoon -- Pharyngeal --  Pharyngeal- Honey Cup Delayed swallow initiation-vallecula;Reduced airway/laryngeal closure;Reduced tongue base retraction;Penetration/Aspiration during swallow;Trace aspiration Pharyngeal Material enters airway, remains ABOVE vocal cords and not ejected out Pharyngeal- Nectar Teaspoon -- Pharyngeal -- Pharyngeal- Nectar Cup Delayed swallow initiation-vallecula;Reduced airway/laryngeal closure;Penetration/Aspiration during swallow;Reduced tongue base retraction;Trace aspiration Pharyngeal Material enters airway, remains ABOVE vocal cords and not ejected out Pharyngeal- Nectar Straw -- Pharyngeal -- Pharyngeal- Thin Teaspoon -- Pharyngeal -- Pharyngeal- Thin Cup Delayed swallow initiation-vallecula;Reduced airway/laryngeal closure;Reduced tongue base retraction;Penetration/Aspiration before swallow;Penetration/Aspiration during swallow Pharyngeal Material enters airway, remains ABOVE vocal cords and not ejected out Pharyngeal- Thin Straw -- Pharyngeal -- Pharyngeal- Puree Delayed swallow initiation-vallecula;Reduced epiglottic inversion;Reduced tongue base retraction;Pharyngeal residue - valleculae Pharyngeal Material does  not enter airway Pharyngeal- Mechanical Soft -- Pharyngeal -- Pharyngeal- Regular Delayed swallow initiation-vallecula;Reduced epiglottic inversion;Reduced tongue base retraction;Pharyngeal residue - valleculae Pharyngeal Material does not enter airway Pharyngeal- Multi-consistency -- Pharyngeal -- Pharyngeal- Pill -- Pharyngeal -- Pharyngeal Comment --  CHL IP CERVICAL ESOPHAGEAL PHASE 11/06/2018 Cervical Esophageal Phase WFL Pudding Teaspoon -- Pudding Cup -- Honey Teaspoon -- Honey Cup -- Nectar Teaspoon -- Nectar Cup -- Nectar Straw -- Thin Teaspoon -- Thin Cup -- Thin Straw -- Puree -- Mechanical Soft -- Regular -- Multi-consistency -- Pill -- Cervical Esophageal Comment -- Leigh E Borum 11/06/2018, 3:26 PM               Microbiology: Recent Results (from the past 240 hour(s))  MRSA PCR  Screening     Status: None   Collection Time: 11/02/18  6:53 PM  Result Value Ref Range Status   MRSA by PCR NEGATIVE NEGATIVE Final    Comment:        The GeneXpert MRSA Assay (FDA approved for NASAL specimens only), is one component of a comprehensive MRSA colonization surveillance program. It is not intended to diagnose MRSA infection nor to guide or monitor treatment for MRSA infections.   Culture, blood (routine x 2)     Status: None   Collection Time: 11/02/18  8:12 PM  Result Value Ref Range Status   Specimen Description BLOOD LEFT HAND  Final   Special Requests   Final    BOTTLES DRAWN AEROBIC ONLY Blood Culture results may not be optimal due to an inadequate volume of blood received in culture bottles   Culture   Final    NO GROWTH 5 DAYS Performed at [facility], [place] [address] [place], [place] [address]    Report Status 11/07/2018 FINAL  Final  Culture, blood (routine x 2)     Status: None   Collection Time: 11/02/18  8:40 PM  Result Value Ref Range Status   Specimen Description BLOOD RIGHT ARM  Final   Special Requests   Final    BOTTLES DRAWN AEROBIC AND ANAEROBIC Blood Culture adequate volume   Culture   Final    NO GROWTH 5 DAYS Performed at [facility], [place] [address], [place], [place] [address]    Report Status 11/07/2018 FINAL  Final  Culture, respiratory (tracheal aspirate)     Status: None   Collection Time: 11/02/18 11:08 PM  Result Value Ref Range Status   Specimen Description TRACHEAL ASPIRATE  Final   Special Requests NONE  Final   Gram Stain   Final    ABUNDANT WBC PRESENT, PREDOMINANTLY PMN ABUNDANT GRAM POSITIVE COCCI ABUNDANT GRAM NEGATIVE RODS    Culture   Final    Consistent with normal respiratory flora. Performed at [facility], [place] [address], [place], [place] [address]    Report Status 11/05/2018 FINAL  Final     Labs: Basic Metabolic Panel: Recent Labs  Lab 11/04/18 0401 11/04/18 1618 11/05/18 0107  11/06/18 0248 11/07/18 0224  NA 140 141 141 145 144  K 3.9 4.1 3.5 3.6 3.6  CL 110 108 107 106 105  CO2 19* 21* 23 27 28   GLUCOSE 263* 291* 263* 150* 109*  BUN 50* 45* 43* 33* 27*  CREATININE 1.44* 1.22 1.12 0.96 0.93  CALCIUM 7.8* 8.2* 8.2* 8.6* 9.0  MG  --   --   --  1.7 2.0   Liver Function Tests: Recent Labs  Lab 11/04/18 0401  AST 94*  ALT 127*  ALKPHOS 59  BILITOT 0.7  PROT 5.1*  ALBUMIN 1.9*   CBC: Recent Labs  Lab 11/06/18 0248 11/07/18 0224 11/08/18 0543 11/09/18 0340 11/10/18 0405  WBC 13.9* 13.4* 15.5* 13.6* 14.2*  HGB 10.7* 11.2* 11.4* 11.4* 11.0*  HCT 34.4* 36.6* 37.0* 36.8* 36.7*  MCV 87.1 87.8 88.3 88.9 88.9  PLT 160 187 236 261 282   CBG: Recent Labs  Lab 11/09/18 1955 11/09/18 2355 11/10/18 0424 11/10/18 0721 11/10/18 1159  GLUCAP 167* 132* 103* 133* 163*    Principal Problem:   Aspiration pneumonia ([place]) Active Problems:   Atrial fibrillation (HCC)   COPD (chronic obstructive pulmonary disease) ([place])   Acute respiratory failure with hypoxia and hypercapnia (HCC)   Bronchiectasis (HCC)   History of total hip replacement, left   CKD (chronic kidney disease), stage III (HCC)   BPH (benign prostatic hyperplasia)   Dysphagia   Time coordinating discharge: 45 minutes  Signed:  Murray Hodgkins, MD  [facility]  11/10/2018, 2:44 PM

## 2018-11-10 NOTE — Progress Notes (Signed)
CSW called and spoke with Assistant Director Georga Kaufmann about patient discharge. Per Mr. Rolena Infante, if patient is still here at around 8:00pm, RN is to call PTAR and receive an update. If it is 11:00pm or later, the RN is to cancel the transport/discharge and leave for the social worker in the morning.   Please continue to update the charge nurse at [place], MSW, [place]

## 2018-11-10 NOTE — Progress Notes (Signed)
ANTICOAGULATION CONSULT NOTE - Follow Up Consult  Pharmacy Consult for warfarin Indication: atrial fibrillation  Allergies  Allergen Reactions  . Contrast Media [Iodinated Diagnostic Agents] Rash  . Penicillins Rash    Did it involve swelling of the face/tongue/throat, SOB, or low BP? Unknown Did it involve sudden or severe rash/hives, skin peeling, or any reaction on the inside of your mouth or nose? Unknown Did you need to seek medical attention at a hospital or doctor's office? Unknown When did it last happen? If all above answers are "NO", may proceed with cephalosporin use.   . Quinidine Rash  . Quinine Derivatives Rash    quinidine  . Sulfa Antibiotics Rash    Patient Measurements: Height: 6\' 3"  (190.5 cm) Weight: 262 lb 5.6 oz (119 kg) IBW/kg (Calculated) : 84.5 Heparin Dosing Weight: 112 kg  Vital Signs: Temp: 98.1 F (36.7 C) (01/24 0811) Temp Source: Oral (01/24 0811) BP: 139/62 (01/24 0811) Pulse Rate: 68 (01/24 0811)  Labs: Recent Labs    11/08/18 0543 11/09/18 0340 11/09/18 1134 11/09/18 1926 11/10/18 0405  HGB 11.4* 11.4*  --   --  11.0*  HCT 37.0* 36.8*  --   --  36.7*  PLT 236 261  --   --  282  LABPROT 15.7* 15.6*  --   --  15.4*  INR 1.27 1.25  --   --  1.23  HEPARINUNFRC 0.40 0.29* 0.56 <0.10*  --     Estimated Creatinine Clearance: 94 mL/min (by C-G formula based on SCr of 0.93 mg/dL).   Medications:  Scheduled:  . arformoterol  15 mcg Nebulization BID  . budesonide (PULMICORT) nebulizer solution  0.25 mg Nebulization BID  . chlorhexidine  15 mL Mouth Rinse BID  . diltiazem  45 mg Per Tube Q6H  . donepezil  10 mg Oral QHS  . enoxaparin (LOVENOX) injection  40 mg Subcutaneous Q24H  . finasteride  5 mg Oral Daily  . insulin aspart  0-15 Units Subcutaneous Q4H  . insulin glargine  7 Units Subcutaneous Daily  . mouth rinse  15 mL Mouth Rinse q12n4p  . polyethylene glycol  17 g Oral BID  . senna  1 tablet Oral QHS  . simvastatin   40 mg Oral q1800  . tamsulosin  0.4 mg Oral Daily  . umeclidinium-vilanterol  1 puff Inhalation Daily  . Warfarin - Pharmacist Dosing Inpatient   Does not apply q1800    Assessment: 9 yoM presented to [facility]/p fall and underwent fixation for hip fracture on 10/31/18, required re-intubation in the PACU and was transferred to [facility] for further management. PMH s/f afib on warfarin PTA. Pharmacy consulted to continue dosing warfarin.  Heparin drip discontinued in the evening on 1/23 - according to MD note - no need to bridge.  INR 1.23 this am which is subtherapeutic (slowly trending down). CBC stable DDI: Patient was on Flagyl 1/16-17  PTA warfarin dose - 5mg  daily except 7.5mg  on Mon/Fri  Goal of Therapy:  INR 2-3   Plan:  Give warfarin 10 mg po x 1 Monitor daily INR, CBC, clinical course, s/sx of bleed, PO intake, DDI  Thank you for allowing [place] to participate in this patients care.   Alanda Slim, PharmD, [facility] Clinical Pharmacist Please see AMION for all Pharmacists' Contact Phone Numbers 11/10/2018, 8:21 AM

## 2018-11-10 NOTE — Progress Notes (Signed)
Patient spouse Roderic Lammert updated on discharge to facility.

## 2018-11-10 NOTE — Progress Notes (Signed)
Attempted to call report to receiving nurse at [phone] .Patient discharged with PTAR to go to facility.

## 2018-11-10 NOTE — Progress Notes (Addendum)
Modified Barium Swallow Progress Note  Patient Details  Name: [name] [name] MRN: [id number] Date of Birth: 1940/12/29  Today's Date: 11/10/2018  Modified Barium Swallow completed.  Full report located under Chart Review in the Imaging Section.  Brief recommendations include the following:  Clinical Impression  Pt demonstrates significant improvement in cognition and strength since prior MBS. Pt now with mostly automatic response with majority of swallows with normal function. However, in 3-5 instances there was trace penetration before the swallow due to premature spillage of bolus to pharynx and at least two visualized instances of sensed aspiration (pt positioning very poor with inabilty to see the larynx on several swallows due to shoulders). Cough response congested but strong and protective. If pt were able to consistently take single sips or slow rate he may be able to reduce aspiration events. For now given ongoing recovery from hip fx and pna recommend pt remain on nectar thick liquids though as his pumonary health, cough strength and mobility improve would expect pt to upgrade to thin liquids and should not need thickened liquids over the long term. Discussed with wife.    Swallow Evaluation Recommendations       SLP Diet Recommendations: Dysphagia 3 (Mech soft) solids;Nectar thick liquid   Liquid Administration via: Cup;Straw   Medication Administration: whole in puree   Supervision: Patient able to self feed   Compensations: Slow rate;Small sips/bites   Postural Changes: Remain semi-upright after after feeds/meals (Comment)   Oral Care Recommendations: Oral care BID     Herbie [place], MA CCC-SLP  Acute Rehabilitation Services Pager [phone] Office [phone]    Herbie [place], MA [place] Pager [phone] Office [phone]  Joenathan Sakuma, Katherene Ponto 11/10/2018,2:17 PM

## 2018-11-10 NOTE — Progress Notes (Signed)
Pt able to void at 1830, bladder scan afterwards showed that pt had 0 ml in bladder. Pt is being very hostile this evening and refusing  A lot of things. Wife was updated on the delayed discharge.

## 2018-11-10 NOTE — Clinical Social Work Placement (Signed)
Nurse to call and report to [phone] ask for [facility] and patient nurse. If the patient does require a foley, please call and let the nurse know. Patient will be going to room 170. Transportation is scheduled for 6:15pm.   CLINICAL SOCIAL WORK PLACEMENT  NOTE  Date:  11/10/2018  Patient Details  Name: [name] [name] MRN: [id number] Date of Birth: 30-Oct-1940  Clinical Social Work is seeking post-discharge placement for this patient at the [place] level of care (*CSW will initial, date and re-position this form in  chart as items are completed):  Yes   Patient/family provided with [place] Work Department's list of facilities offering this level of care within the geographic area requested by the patient (or if unable, by the patient's family).  Yes   Patient/family informed of their freedom to choose among providers that offer the needed level of care, that participate in Medicare, Medicaid or managed care program needed by the patient, have an available bed and are willing to accept the patient.  Yes   Patient/family informed of [place]'s ownership interest in [facility] and [facility], as well as of the fact that they are under no obligation to receive care at these facilities.  PASRR submitted to EDS on       PASRR number received on 11/07/18     Existing PASRR number confirmed on       FL2 transmitted to all facilities in geographic area requested by pt/family on 11/07/18     FL2 transmitted to all facilities within larger geographic area on       Patient informed that his/her managed care company has contracts with or will negotiate with certain facilities, including the following:        Yes   Patient/family informed of bed offers received.  Patient chooses bed at [facility]     Physician recommends and patient chooses bed at      Patient to be transferred to [facility] on 11/10/18.  Patient  to be transferred to facility by PTAR     Patient family notified on 11/10/18 of transfer.  Name of family member notified:  Hyman Hopes     PHYSICIAN       Additional Comment:    _______________________________________________ Gelene Mink, [place] 11/10/2018, 3:58 PM

## 2018-11-10 NOTE — Plan of Care (Signed)
  Problem: Pain Managment: Goal: General experience of comfort will improve Outcome: Progressing   

## 2018-11-10 NOTE — Progress Notes (Signed)
   11/10/18 1300  OT Visit Information  Last OT Received On 11/10/18  Reason Eval/Treat Not Completed Patient at procedure or test/ unavailable (Pt leaving for swallow test and then will discharge per PT)  Nestor Lewandowsky, OTR/L Acute Rehabilitation Services Pager: [phone] Office: [phone]

## 2018-11-10 NOTE — Progress Notes (Signed)
PT Cancellation Note  Patient Details Name: MORITZ LEVER MRN: [id number] DOB: Oct 19, 1940   Cancelled Treatment:    Reason Eval/Treat Not Completed: Other (comment).  Nursing asked PT not to see pt as he was leaving immediately for a swallowing test, then would leave right afterward.  Follow up as needed at another time.   Ramond Dial 11/10/2018, 4:44 PM   Mee Hives, PT MS Acute Rehab Dept. Number: [place] and [place]

## 2018-11-11 DIAGNOSIS — R4182 Altered mental status, unspecified: Secondary | ICD-10-CM | POA: Diagnosis not present

## 2018-11-11 DIAGNOSIS — R0902 Hypoxemia: Secondary | ICD-10-CM | POA: Diagnosis not present

## 2018-11-11 DIAGNOSIS — J189 Pneumonia, unspecified organism: Secondary | ICD-10-CM | POA: Diagnosis not present

## 2018-11-11 DIAGNOSIS — R531 Weakness: Secondary | ICD-10-CM | POA: Diagnosis not present

## 2018-11-12 DIAGNOSIS — J9621 Acute and chronic respiratory failure with hypoxia: Secondary | ICD-10-CM | POA: Diagnosis not present

## 2018-11-12 DIAGNOSIS — S72002A Fracture of unspecified part of neck of left femur, initial encounter for closed fracture: Secondary | ICD-10-CM | POA: Diagnosis not present

## 2018-11-12 DIAGNOSIS — J690 Pneumonitis due to inhalation of food and vomit: Secondary | ICD-10-CM | POA: Diagnosis not present

## 2018-11-12 DIAGNOSIS — A419 Sepsis, unspecified organism: Secondary | ICD-10-CM | POA: Diagnosis not present

## 2018-11-13 DIAGNOSIS — S72002A Fracture of unspecified part of neck of left femur, initial encounter for closed fracture: Secondary | ICD-10-CM | POA: Diagnosis not present

## 2018-11-13 DIAGNOSIS — A419 Sepsis, unspecified organism: Secondary | ICD-10-CM | POA: Diagnosis not present

## 2018-11-13 DIAGNOSIS — J9621 Acute and chronic respiratory failure with hypoxia: Secondary | ICD-10-CM | POA: Diagnosis not present

## 2018-11-13 DIAGNOSIS — J690 Pneumonitis due to inhalation of food and vomit: Secondary | ICD-10-CM | POA: Diagnosis not present

## 2018-11-13 DIAGNOSIS — G3189 Other specified degenerative diseases of nervous system: Secondary | ICD-10-CM | POA: Diagnosis not present

## 2018-11-14 DIAGNOSIS — J449 Chronic obstructive pulmonary disease, unspecified: Secondary | ICD-10-CM | POA: Diagnosis not present

## 2018-11-14 DIAGNOSIS — J690 Pneumonitis due to inhalation of food and vomit: Secondary | ICD-10-CM | POA: Diagnosis not present

## 2018-11-14 DIAGNOSIS — J9621 Acute and chronic respiratory failure with hypoxia: Secondary | ICD-10-CM | POA: Diagnosis not present

## 2018-11-14 DIAGNOSIS — M84351A Stress fracture, right femur, initial encounter for fracture: Secondary | ICD-10-CM | POA: Diagnosis not present

## 2018-11-14 DIAGNOSIS — A419 Sepsis, unspecified organism: Secondary | ICD-10-CM | POA: Diagnosis not present

## 2018-11-14 DIAGNOSIS — I10 Essential (primary) hypertension: Secondary | ICD-10-CM | POA: Diagnosis not present

## 2018-11-14 DIAGNOSIS — I482 Chronic atrial fibrillation, unspecified: Secondary | ICD-10-CM | POA: Diagnosis not present

## 2018-11-14 DIAGNOSIS — S72002A Fracture of unspecified part of neck of left femur, initial encounter for closed fracture: Secondary | ICD-10-CM | POA: Diagnosis not present

## 2018-11-14 DIAGNOSIS — E108 Type 1 diabetes mellitus with unspecified complications: Secondary | ICD-10-CM | POA: Diagnosis not present

## 2018-11-15 DIAGNOSIS — I10 Essential (primary) hypertension: Secondary | ICD-10-CM | POA: Diagnosis not present

## 2018-11-15 DIAGNOSIS — J449 Chronic obstructive pulmonary disease, unspecified: Secondary | ICD-10-CM | POA: Diagnosis not present

## 2018-11-15 DIAGNOSIS — M84351A Stress fracture, right femur, initial encounter for fracture: Secondary | ICD-10-CM | POA: Diagnosis not present

## 2018-11-15 DIAGNOSIS — S72002A Fracture of unspecified part of neck of left femur, initial encounter for closed fracture: Secondary | ICD-10-CM | POA: Diagnosis not present

## 2018-11-15 DIAGNOSIS — E108 Type 1 diabetes mellitus with unspecified complications: Secondary | ICD-10-CM | POA: Diagnosis not present

## 2018-11-15 DIAGNOSIS — J9621 Acute and chronic respiratory failure with hypoxia: Secondary | ICD-10-CM | POA: Diagnosis not present

## 2018-11-15 DIAGNOSIS — J690 Pneumonitis due to inhalation of food and vomit: Secondary | ICD-10-CM | POA: Diagnosis not present

## 2018-11-15 DIAGNOSIS — A419 Sepsis, unspecified organism: Secondary | ICD-10-CM | POA: Diagnosis not present

## 2018-11-15 DIAGNOSIS — I482 Chronic atrial fibrillation, unspecified: Secondary | ICD-10-CM | POA: Diagnosis not present

## 2018-11-18 DEATH — deceased

## 2019-05-04 IMAGING — DX DG CHEST 1V PORT
1 series · 1 of 1 positions shown · non-contrast
Comparison: 08/12/2011.

CLINICAL DATA: Respiratory failure with hypoxia. Orogastric tube
placement.

EXAM:
PORTABLE CHEST 1 VIEW

[chest]
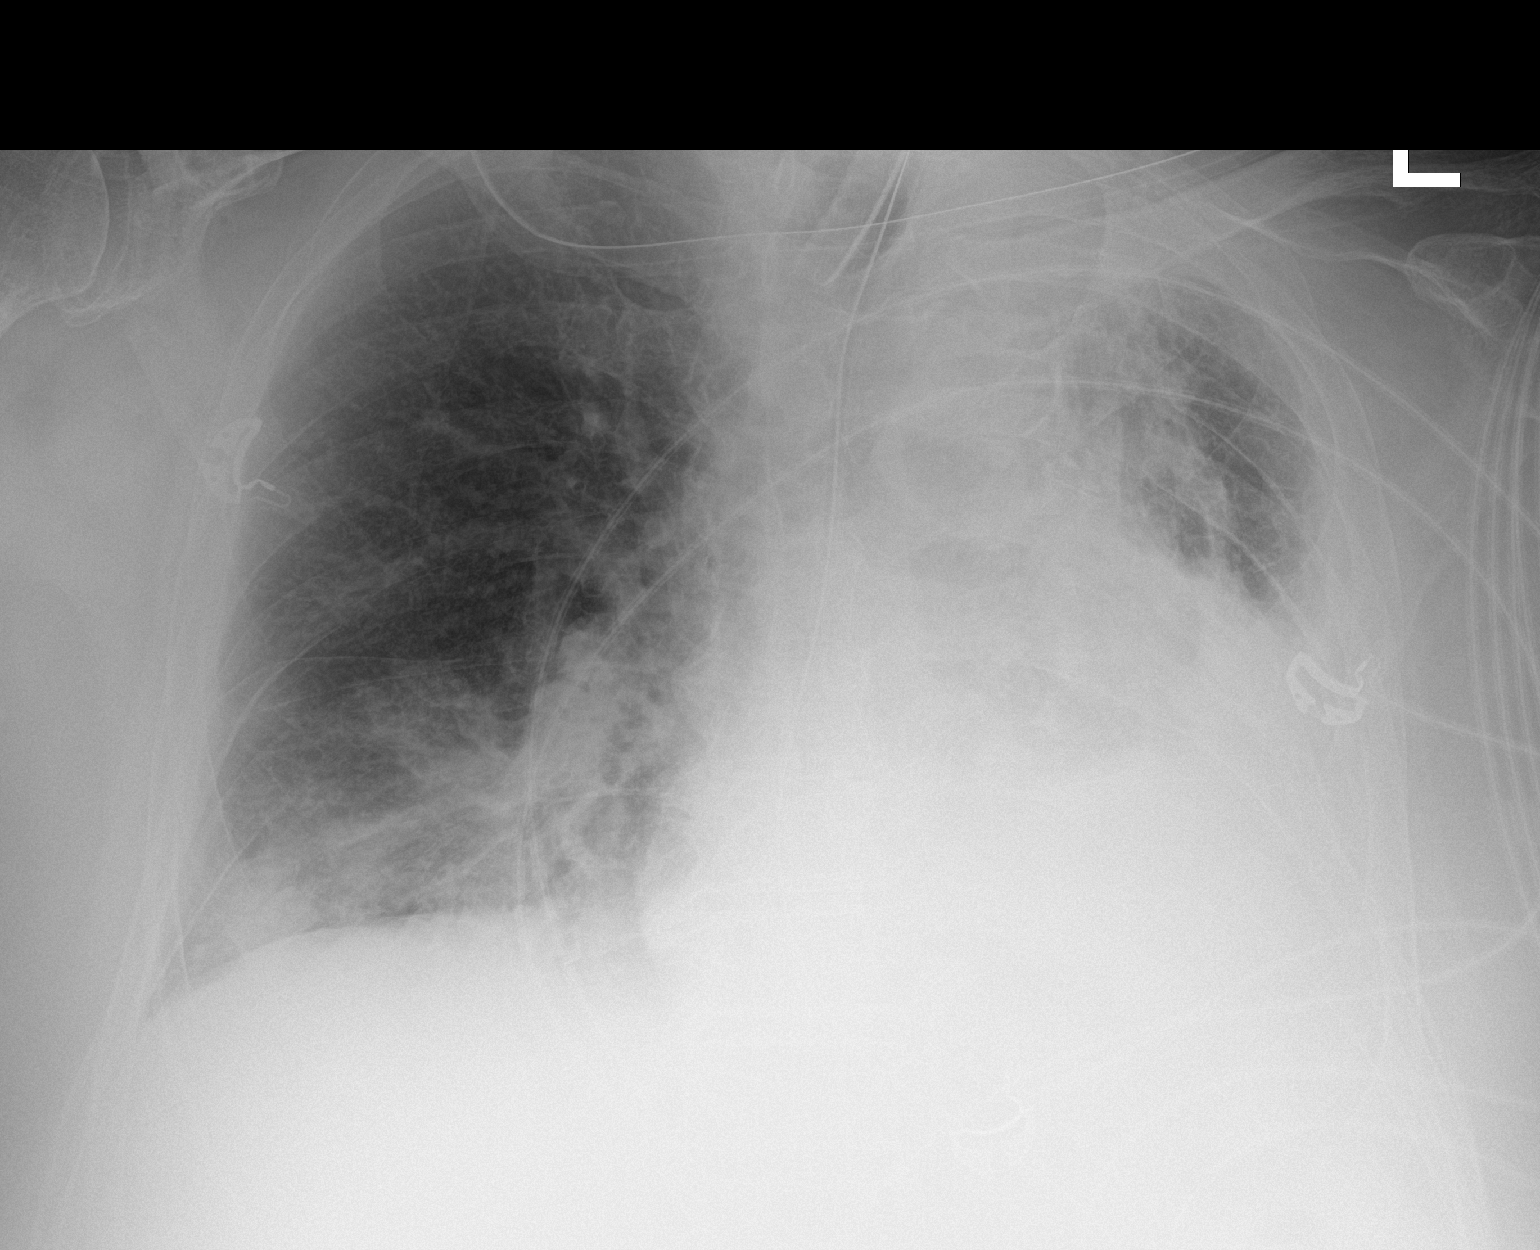

[1 of 1 positions shown; findings below may reference images not displayed]

FINDINGS: Endotracheal tube in satisfactory position. Orogastric tube
extending into the stomach. Enlarged cardiac silhouette.
Moderate-sized left pleural effusion, similar to the previous
examination. Interval patchy opacity the mid and upper portions of
the left lung. Interval patchy opacity the right lung base.
Progressive prominence of the interstitial markings with normal
vascularity. Thoracic spine and right shoulder degenerative changes.
IMPRESSION: 1. Interval bilateral probable pneumonia.
2. Stable moderate-sized left pleural effusion/scarring.
3. Progressive chronic interstitial lung disease.

## 2019-05-05 IMAGING — CT CT HEAD W/O CM
4 series · 16 of 47 positions shown, 18 images · non-contrast
Comparison: October 29, 2018

CLINICAL DATA: Recent fall

EXAM:
CT HEAD WITHOUT CONTRAST
TECHNIQUE: Contiguous axial images were obtained from the base of the skull
through the vertex without intravenous contrast.

[Series 3: head without · axial · non-contrast · 0.41mm/px · z∈[-152,-17]mm · 7 of 37 slices shown, 9 images]
[im 5/37  brain]
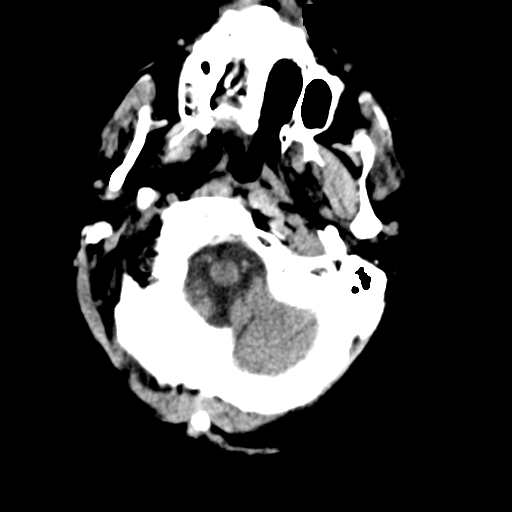
[im 5/37  bone]
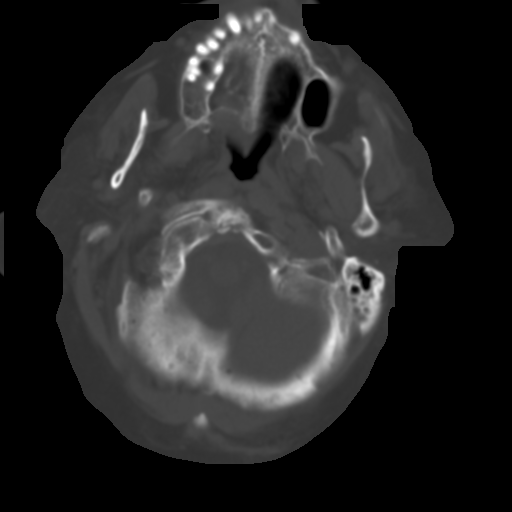
[im 10/37  brain]
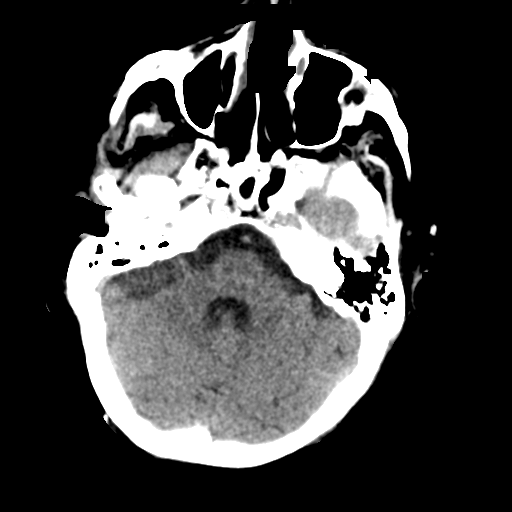
[im 14/37  brain]
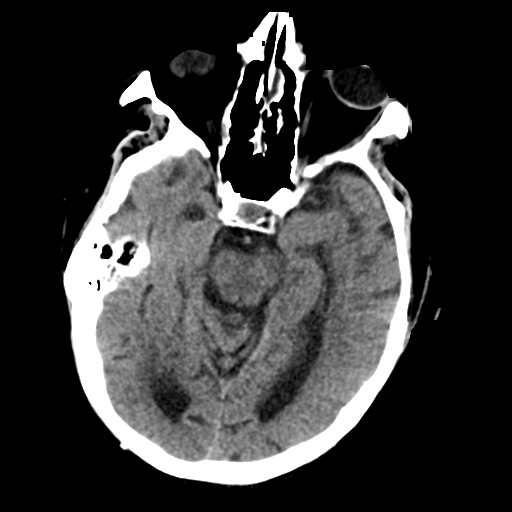
[im 19/37  brain]
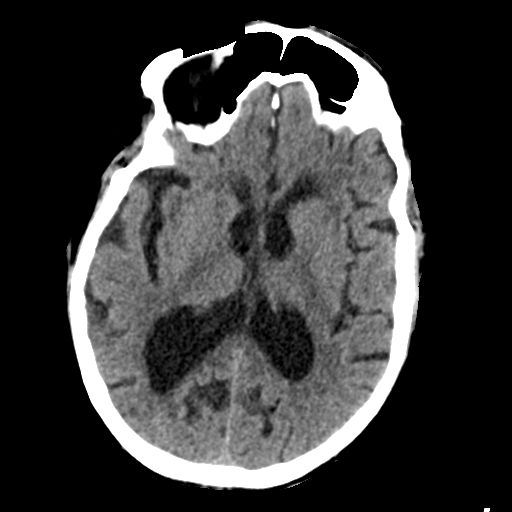
[im 23/37  brain]
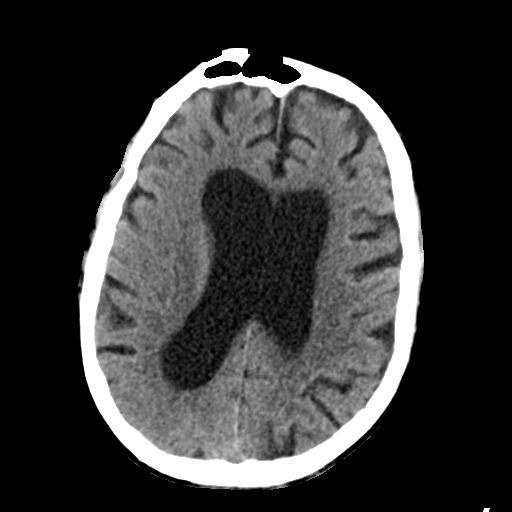
[im 23/37  bone]
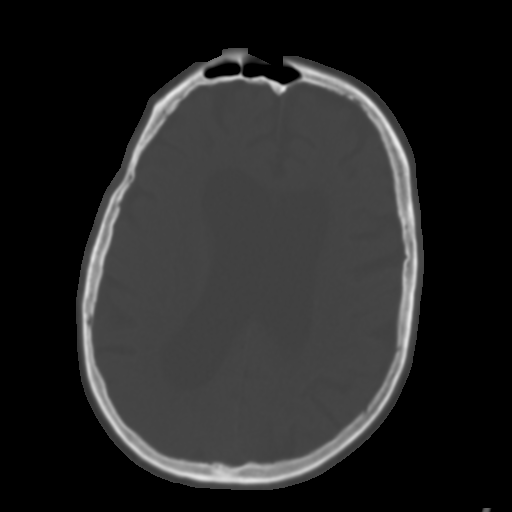
[im 28/37  brain]
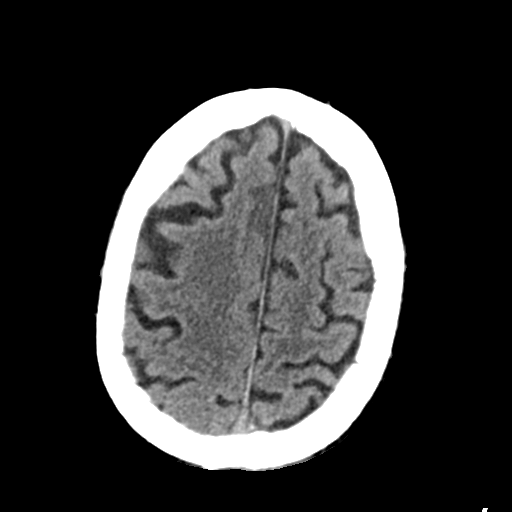
[im 32/37  brain]
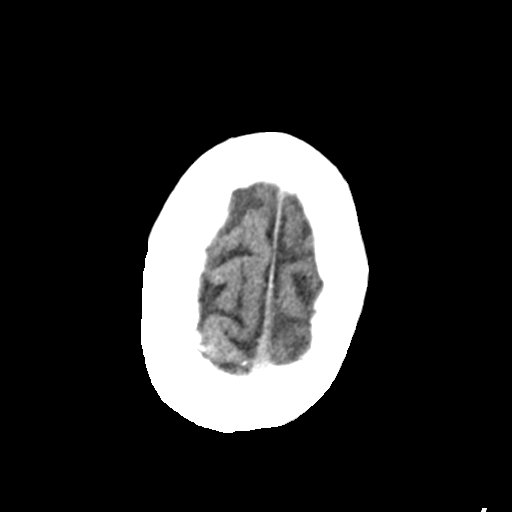

[Series 4: head bone · axial · 0.41mm/px · z∈[-154,-118]mm · 3 of 91 slices shown]
[im 10/91  bone]
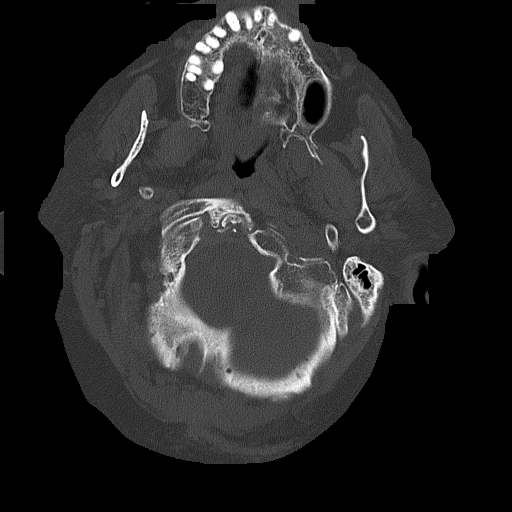
[im 19/91  bone]
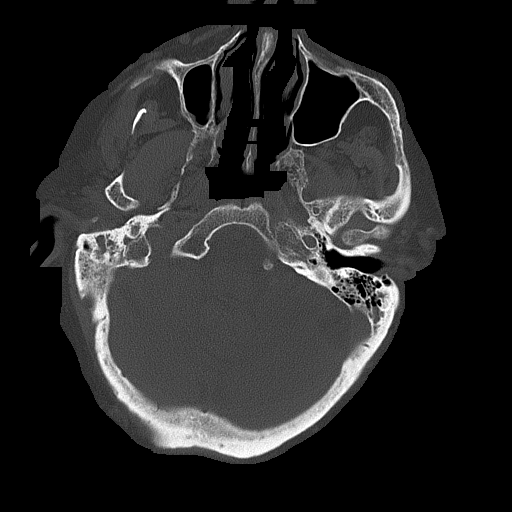
[im 28/91  bone]
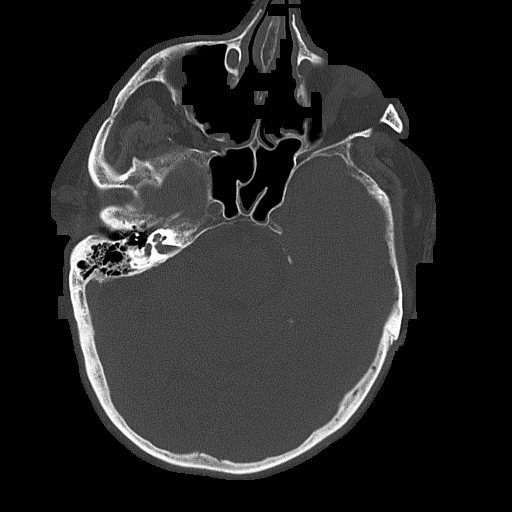

[Series 5: head without cor · coronal · non-contrast · 0.33mm/px · 3 of 67 slices shown]
[im 23/67  brain]
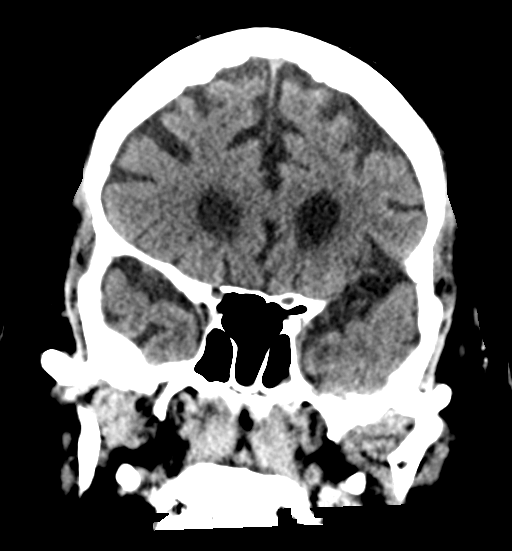
[im 30/67  brain]
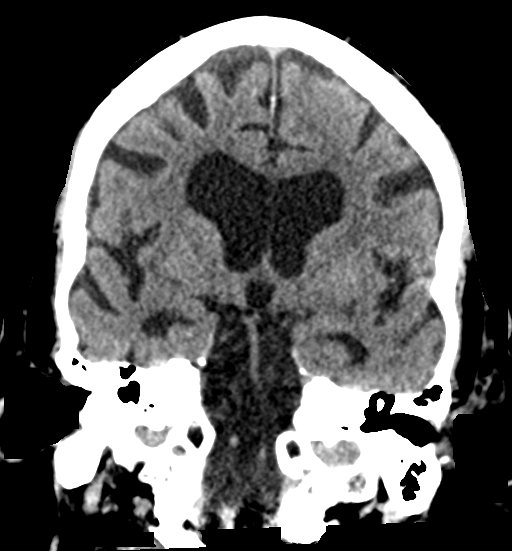
[im 37/67  brain]
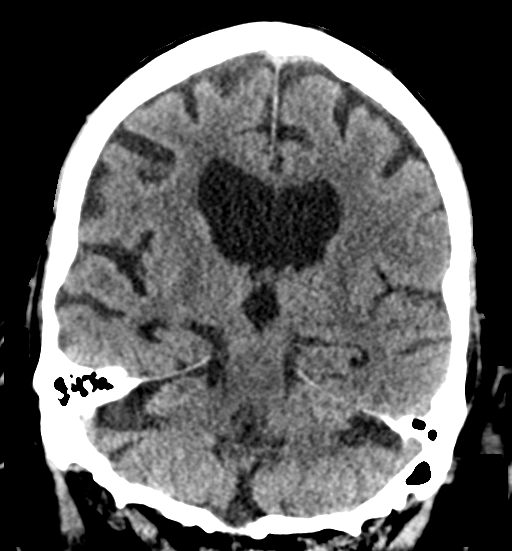

[Series 6: head without sag · sagittal · non-contrast · 0.36mm/px · 3 of 59 slices shown]
[im 20/59  brain]
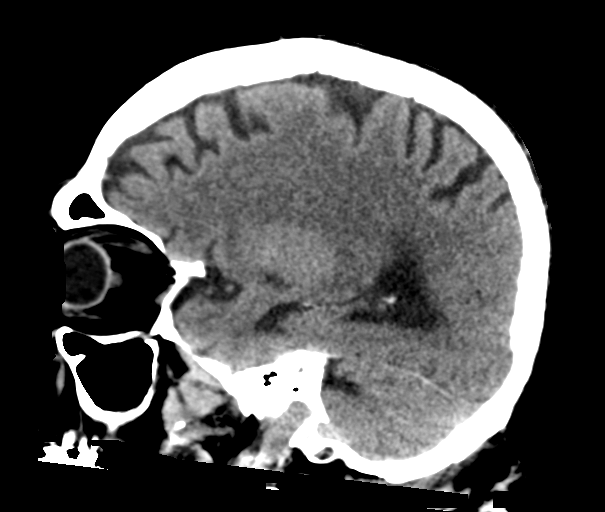
[im 30/59  brain]
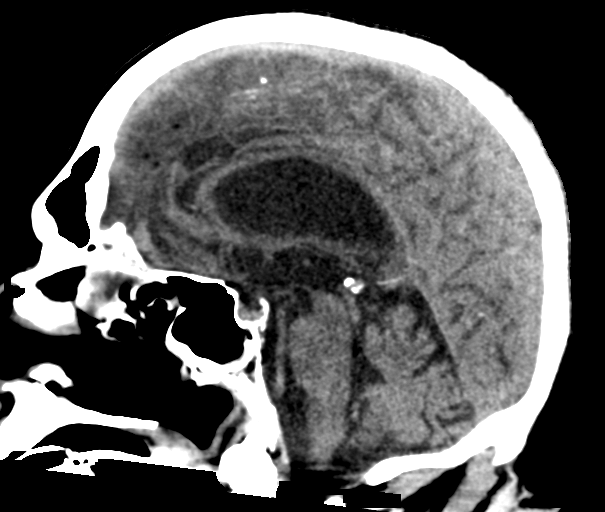
[im 39/59  brain]
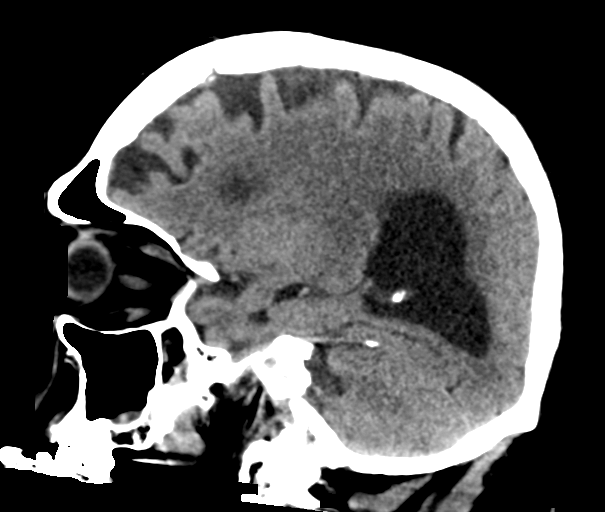

[16 of 47 positions shown; findings below may reference images not displayed]

FINDINGS: Brain: There is moderate ventricular enlargement with milder sulcal
enlargement. There is no intracranial mass, hemorrhage, extra-axial
fluid collection, or midline shift. There is mild small vessel
disease in the centra semiovale bilaterally. No acute appearing
infarct is evident.

Vascular: There is no hyperdense vessel. There is calcification in
each carotid siphon region.

Skull: Bony calvarium appears intact. There is a small right frontal
scalp hematoma.

Sinuses/Orbits: There is opacification in the right maxillary
antrum. Patient has had antrostomies bilaterally. There is mucosal
thickening in several ethmoid air cells. Orbits appear symmetric
bilaterally.

Other: There are scattered areas of mastoid air cell disease
bilaterally.
IMPRESSION: Atrophy with ventricles in proportion larger than sulci. A degree of
underlying communicating hydrocephalus not be excluded.

Mild periventricular small vessel disease. No acute infarct evident.
No mass or hemorrhage.

There are foci of arterial vascular calcification.

There is a small right frontal scalp hematoma.  No fracture evident.

There are areas of paranasal sinus and mastoid air cell disease.
Postoperative antrostomies bilaterally.

## 2019-05-05 IMAGING — DX DG CHEST 1V PORT
1 series · 1 of 1 positions shown · non-contrast
Comparison: 11/02/2018

CLINICAL DATA: Respiratory failure

EXAM:
PORTABLE CHEST 1 VIEW

[chest]
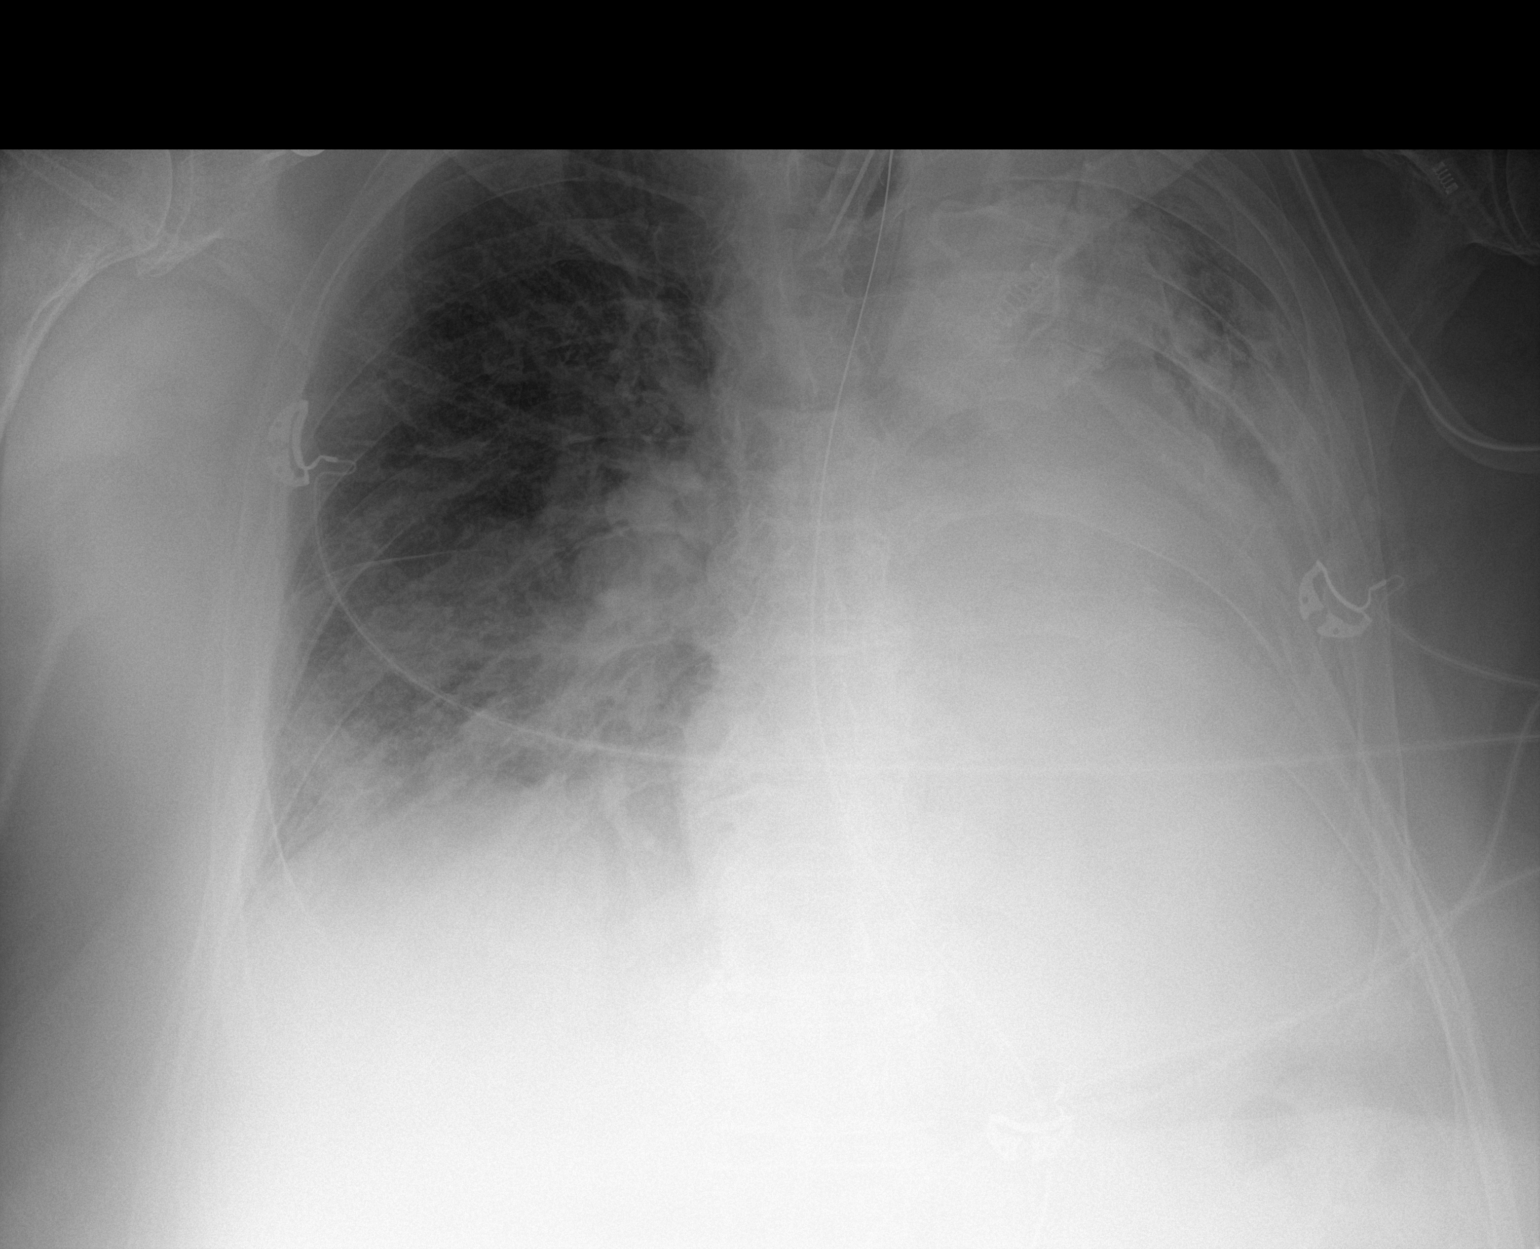

[1 of 1 positions shown; findings below may reference images not displayed]

FINDINGS: Cardiac shadow is stable. Endotracheal tube and nasogastric catheter
are again seen in satisfactory position. Increasing opacification
the left hemithorax is noted likely related to an enlarging effusion
and infiltrate. Right basilar infiltrate is again noted. No bony
abnormality is seen.
IMPRESSION: Increasing opacification of the left hemithorax. The remainder of
the exam is stable.

## 2019-05-06 IMAGING — DX DG CHEST 1V PORT
1 series · 1 of 1 positions shown · non-contrast
Comparison: 11/03/2018

CLINICAL DATA: Respiratory failure

EXAM:
PORTABLE CHEST 1 VIEW

[chest]
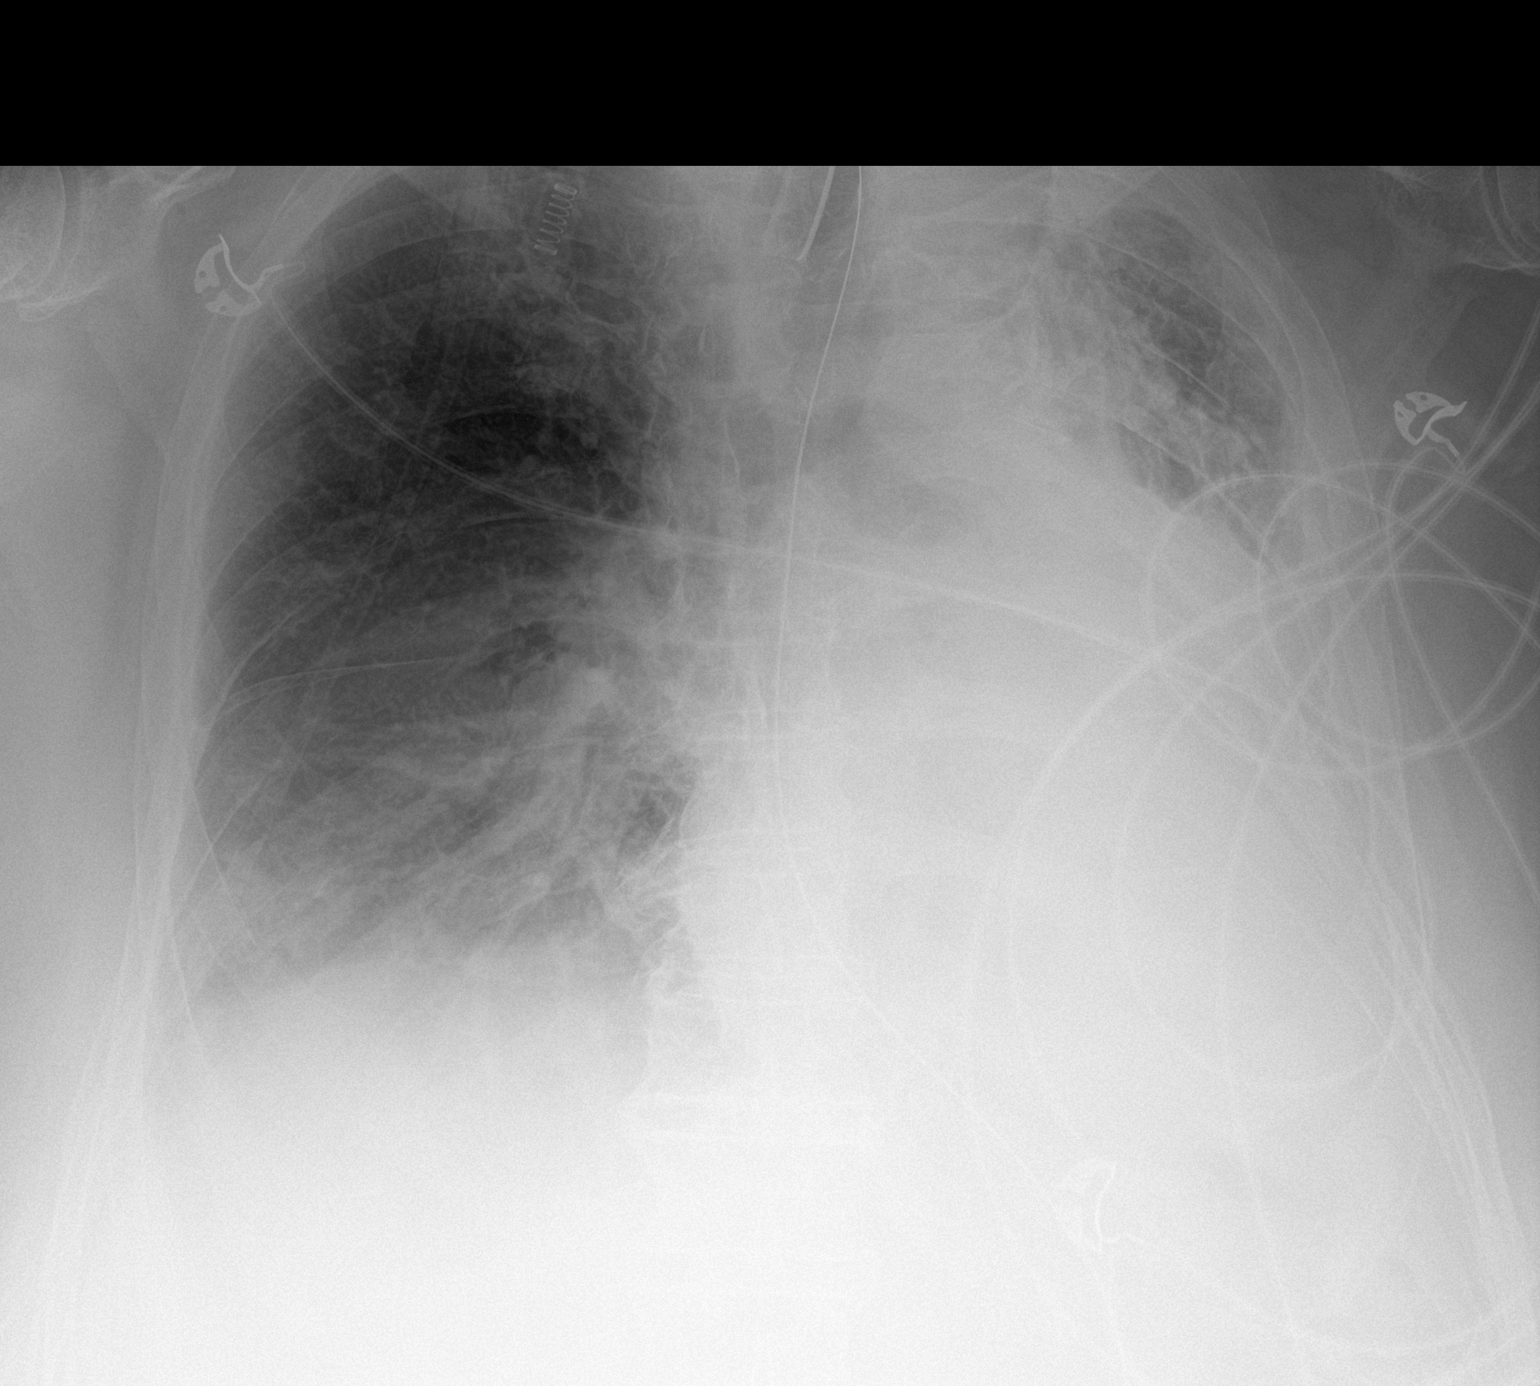

[1 of 1 positions shown; findings below may reference images not displayed]

FINDINGS: Endotracheal tube in satisfactory position.  NG tube in the stomach.

Extensive airspace disease on the left is unchanged. Some of this is
due to chronic pleuroparenchymal scarring however there superimposed
acute disease particularly in the left lower lobe. Right lower lobe
airspace disease unchanged. Vascular congestion on the right appears
improved. Small right effusion.
IMPRESSION: Endotracheal tube remains in good position. Extensive bilateral
airspace disease left greater than right. Apparent improvement in
vascular congestion.
